# Patient Record
Sex: Male | Born: 1960
Health system: Southern US, Community
[De-identification: ages and names within clinical notes are randomized; demographics above are authoritative.]

## PROBLEM LIST (undated history)

## (undated) DIAGNOSIS — C3490 Malignant neoplasm of unspecified part of unspecified bronchus or lung: Secondary | ICD-10-CM

## (undated) DIAGNOSIS — M009 Pyogenic arthritis, unspecified: Secondary | ICD-10-CM

## (undated) HISTORY — DX: Pyogenic arthritis, unspecified: M00.9

## (undated) HISTORY — PX: OTHER SURGICAL HISTORY: SHX169

---

## 2007-09-08 ENCOUNTER — Emergency Department (HOSPITAL_COMMUNITY): Admission: EM | Admit: 2007-09-08 | Discharge: 2007-09-08 | Payer: Self-pay | Admitting: Emergency Medicine

## 2010-11-16 LAB — URINALYSIS, ROUTINE W REFLEX MICROSCOPIC
Glucose, UA: NEGATIVE
Hgb urine dipstick: NEGATIVE
Ketones, ur: NEGATIVE
Protein, ur: NEGATIVE
pH: 7

## 2010-11-16 LAB — CBC
HCT: 45.3
Hemoglobin: 15.1
MCV: 88.1
RBC: 5.13
WBC: 4.5

## 2010-11-16 LAB — DIFFERENTIAL
Eosinophils Absolute: 0
Eosinophils Relative: 0
Lymphocytes Relative: 38
Lymphs Abs: 1.7
Monocytes Relative: 7

## 2010-11-16 LAB — BASIC METABOLIC PANEL
BUN: 8
Chloride: 104
GFR calc Af Amer: >60
GFR calc non Af Amer: >60
Potassium: 4.3
Sodium: 139

## 2010-11-16 LAB — RAPID URINE DRUG SCREEN, HOSP PERFORMED
Amphetamines: NOT DETECTED
Barbiturates: NOT DETECTED
Cocaine: POSITIVE — AB
Opiates: NOT DETECTED
Tetrahydrocannabinol: NOT DETECTED

## 2010-11-16 LAB — HEMOGLOBIN A1C: Mean Plasma Glucose: 143

## 2010-11-16 LAB — URINE MICROSCOPIC-ADD ON

## 2010-11-16 LAB — ETHANOL: Alcohol, Ethyl (B): <5

## 2011-08-30 ENCOUNTER — Encounter (HOSPITAL_BASED_OUTPATIENT_CLINIC_OR_DEPARTMENT_OTHER): Payer: Self-pay

## 2011-08-30 ENCOUNTER — Emergency Department (HOSPITAL_BASED_OUTPATIENT_CLINIC_OR_DEPARTMENT_OTHER)
Admission: EM | Admit: 2011-08-30 | Discharge: 2011-08-30 | Disposition: A | Discharge status: Home / Self Care | Payer: Self-pay | Attending: Emergency Medicine | Admitting: Emergency Medicine

## 2011-08-30 DIAGNOSIS — IMO0002 Reserved for concepts with insufficient information to code with codable children: Secondary | ICD-10-CM | POA: Insufficient documentation

## 2011-08-30 DIAGNOSIS — F17200 Nicotine dependence, unspecified, uncomplicated: Secondary | ICD-10-CM | POA: Insufficient documentation

## 2011-08-30 DIAGNOSIS — L0291 Cutaneous abscess, unspecified: Secondary | ICD-10-CM

## 2011-08-30 MED ORDER — LIDOCAINE HCL 2 % IJ SOLN
INTRAMUSCULAR | Status: AC
  Filled 2011-08-30: qty 1

## 2011-08-30 MED ORDER — SULFAMETHOXAZOLE-TRIMETHOPRIM 800-160 MG PO TABS: 2.0000 | ORAL_TABLET | Freq: Two times a day (BID) | ORAL | Status: AC

## 2011-08-30 MED ORDER — IBUPROFEN 600 MG PO TABS: 600.0000 mg | ORAL_TABLET | Freq: Four times a day (QID) | ORAL | Status: AC | PRN

## 2011-08-30 MED ORDER — LIDOCAINE HCL 2 % IJ SOLN: 20.0000 mL | Freq: Once | INTRAMUSCULAR | Status: DC

## 2011-08-30 MED ORDER — OXYCODONE-ACETAMINOPHEN 5-325 MG PO TABS: 2.0000 | ORAL_TABLET | ORAL | Status: AC | PRN

## 2011-08-30 NOTE — ED Notes (Signed)
C/o "boil" to left arm x 7 days-draining

## 2011-08-30 NOTE — ED Provider Notes (Signed)
History     CSN: [id number]  Arrival date & time 08/30/11  1857   First MD Initiated Contact with Patient 08/30/11 2013      Chief Complaint  Patient presents with  . Abscess    (Consider location/radiation/quality/duration/timing/severity/associated sxs/prior treatment) HPI  50yoM previously healthy pw LUE pain. Pt states that he noted small bump to area which has become progressively bigger, painful, and now draining. Denies fever/chills. Denies numbness/tingling/weakness extr. H/O abscess scalp in past.   ED Notes, ED Provider Notes from 08/30/11 0000 to 08/30/11 19:26:48       Angeline Slim, RN 08/30/2011 19:26      C/o "boil" to left arm x 7 days-draining    History reviewed. No pertinent past medical history.  Past Surgical History  Procedure Date  . Knee surgery     No family history on file.  History  Substance Use Topics  . Smoking status: Current Everyday Smoker  . Smokeless tobacco: Not on file  . Alcohol Use: Yes      Review of Systems  All other systems reviewed and are negative.   except as noted HPI   Allergies  Review of patient's allergies indicates no known allergies.  Home Medications   Current Outpatient Rx  Name Route Sig Dispense Refill  . IBUPROFEN 600 MG PO TABS Oral Take 1 tablet (600 mg total) by mouth every 6 (six) hours as needed for pain. 30 tablet 0  . OXYCODONE-ACETAMINOPHEN 5-325 MG PO TABS Oral Take 2 tablets by mouth every 4 (four) hours as needed for pain. 6 tablet 0  . SULFAMETHOXAZOLE-TRIMETHOPRIM 800-160 MG PO TABS Oral Take 2 tablets by mouth every 12 (twelve) hours. 40 tablet 0    BP 135/88  Pulse 86  Temp 98.7 F (37.1 C) (Oral)  Resp 18  Ht 6' (1.829 m)  Wt 165 lb (74.844 kg)  BMI 22.38 kg/m2  SpO2 98%  Physical Exam  Nursing note and vitals reviewed. Constitutional: He is oriented to person, place, and time. He appears well-developed and well-nourished. No distress.  HENT:  Head: Atraumatic.    Mouth/Throat: Oropharynx is clear and moist.  Eyes: Conjunctivae are normal. Pupils are equal, round, and reactive to light.  Neck: Neck supple.  Cardiovascular: Normal rate, regular rhythm, normal heart sounds and intact distal pulses.  Exam reveals no gallop and no friction rub.   No murmur heard. Pulmonary/Chest: Effort normal. No respiratory distress. He has no wheezes. He has no rales.  Abdominal: Soft. Bowel sounds are normal. There is no tenderness. There is no rebound and no guarding.  Musculoskeletal: Normal range of motion. He exhibits no edema and no tenderness.  Neurological: He is alert and oriented to person, place, and time.  Skin: Skin is warm and dry.     Psychiatric: He has a normal mood and affect.    ED Course  Procedures (including critical care time)  Labs Reviewed - No data to display No results found.   1. Abscess     MDM  No incision required. Loculations broken and packing placed. F/U recheck in 48 hours. Bactrim. Precautions for return.        Forbes Cellar, MD 08/30/11 2109

## 2011-09-03 ENCOUNTER — Emergency Department (HOSPITAL_BASED_OUTPATIENT_CLINIC_OR_DEPARTMENT_OTHER)
Admission: EM | Admit: 2011-09-03 | Discharge: 2011-09-03 | Disposition: A | Discharge status: Home / Self Care | Payer: Self-pay | Attending: Emergency Medicine | Admitting: Emergency Medicine

## 2011-09-03 ENCOUNTER — Encounter (HOSPITAL_BASED_OUTPATIENT_CLINIC_OR_DEPARTMENT_OTHER): Payer: Self-pay | Admitting: Family Medicine

## 2011-09-03 DIAGNOSIS — F17200 Nicotine dependence, unspecified, uncomplicated: Secondary | ICD-10-CM | POA: Insufficient documentation

## 2011-09-03 DIAGNOSIS — Z4801 Encounter for change or removal of surgical wound dressing: Secondary | ICD-10-CM | POA: Insufficient documentation

## 2011-09-03 DIAGNOSIS — Z4800 Encounter for change or removal of nonsurgical wound dressing: Secondary | ICD-10-CM

## 2011-09-03 NOTE — ED Notes (Signed)
Pt sts he is here for f/u of abscess to left forearm. Pt sts he was here and wound was drained and packed on Saturday. Drainage noted to gauze. Pt denies fever and sts she is taking abx and denies pain.

## 2011-09-03 NOTE — ED Provider Notes (Addendum)
History     CSN: [id number]  Arrival date & time 09/03/11  1003   First MD Initiated Contact with Patient 09/03/11 1036      Chief Complaint  Patient presents with  . Wound Check    (Consider location/radiation/quality/duration/timing/severity/associated sxs/prior treatment) HPI 51 year old male comes in for a wound check. He had an abscess on his left forearm treated 4 days ago. He states with one dressing change, some of the packing came out so he cut off the part that had pulled out. The remainder the packing came out when he took off the dressing today. He states that it is not painful and he denies fever, chills, sweats. History reviewed. No pertinent past medical history.  Past Surgical History  Procedure Date  . Knee surgery     No family history on file.  History  Substance Use Topics  . Smoking status: Current Everyday Smoker  . Smokeless tobacco: Not on file  . Alcohol Use: Yes      Review of Systems  Allergies  Review of patient's allergies indicates no known allergies.  Home Medications   Current Outpatient Rx  Name Route Sig Dispense Refill  . IBUPROFEN 600 MG PO TABS Oral Take 1 tablet (600 mg total) by mouth every 6 (six) hours as needed for pain. 30 tablet 0  . OXYCODONE-ACETAMINOPHEN 5-325 MG PO TABS Oral Take 2 tablets by mouth every 4 (four) hours as needed for pain. 6 tablet 0  . SULFAMETHOXAZOLE-TRIMETHOPRIM 800-160 MG PO TABS Oral Take 2 tablets by mouth every 12 (twelve) hours. 40 tablet 0    BP 133/78  Pulse 77  Temp 97.9 F (36.6 C) (Oral)  Resp 18  Ht 6' (1.829 m)  Wt 165 lb (74.844 kg)  BMI 22.38 kg/m2  SpO2 96%  Physical Exam 51 year old male who is resting comfortably in no acute distress. Vital signs are normal. Oxygen saturation is 96% which is normal. Exam of his left forearm shows no wound approximately 1 cm in diameter. The surrounding skin is raised but it is not indurated and not erythematous. There is very slight  purulent drainage from the wound. At this point, I believe that it needs to heal by second intention, but has not needed additional packing. ED Course  Procedures (including critical care time)  Labs Reviewed - No data to display No results found.   1. Abscess packing removal       MDM  Abscess of the left forearm which is healing appropriately. He is advised to keep it covered until the wound closes. He is advised to stop smoking. Prior ED record was reviewed and packing was placed 4 days ago.        Dione Booze, MD 09/03/11 1046  Dione Booze, MD 09/03/11 1046

## 2013-05-10 ENCOUNTER — Emergency Department (HOSPITAL_BASED_OUTPATIENT_CLINIC_OR_DEPARTMENT_OTHER)
Admission: EM | Admit: 2013-05-10 | Discharge: 2013-05-10 | Disposition: A | Discharge status: Home / Self Care | Payer: Self-pay | Attending: Emergency Medicine | Admitting: Emergency Medicine

## 2013-05-10 ENCOUNTER — Encounter (HOSPITAL_BASED_OUTPATIENT_CLINIC_OR_DEPARTMENT_OTHER): Payer: Self-pay | Admitting: Emergency Medicine

## 2013-05-10 DIAGNOSIS — R1013 Epigastric pain: Secondary | ICD-10-CM | POA: Insufficient documentation

## 2013-05-10 DIAGNOSIS — R109 Unspecified abdominal pain: Secondary | ICD-10-CM

## 2013-05-10 DIAGNOSIS — R110 Nausea: Secondary | ICD-10-CM | POA: Insufficient documentation

## 2013-05-10 DIAGNOSIS — R197 Diarrhea, unspecified: Secondary | ICD-10-CM | POA: Insufficient documentation

## 2013-05-10 DIAGNOSIS — F17200 Nicotine dependence, unspecified, uncomplicated: Secondary | ICD-10-CM | POA: Insufficient documentation

## 2013-05-10 LAB — URINALYSIS, ROUTINE W REFLEX MICROSCOPIC
BILIRUBIN URINE: NEGATIVE
Glucose, UA: NEGATIVE mg/dL
Ketones, ur: NEGATIVE mg/dL
Leukocytes, UA: NEGATIVE
Nitrite: NEGATIVE
PH: 6.5 (ref 5.0–8.0)
Protein, ur: NEGATIVE mg/dL
SPECIFIC GRAVITY, URINE: 1.021 (ref 1.005–1.030)
Urobilinogen, UA: 1 mg/dL (ref 0.0–1.0)

## 2013-05-10 LAB — COMPREHENSIVE METABOLIC PANEL
ALBUMIN: 3.7 g/dL (ref 3.5–5.2)
ALK PHOS: 80 U/L (ref 39–117)
ALT: 21 U/L (ref 0–53)
AST: 26 U/L (ref 0–37)
BUN: 13 mg/dL (ref 6–23)
CO2: 25 mEq/L (ref 19–32)
Calcium: 9.6 mg/dL (ref 8.4–10.5)
Chloride: 103 mEq/L (ref 96–112)
Creatinine, Ser: 1.2 mg/dL (ref 0.50–1.35)
GFR calc Af Amer: 79 mL/min — ABNORMAL LOW (ref >90)
GFR calc non Af Amer: 68 mL/min — ABNORMAL LOW (ref >90)
Glucose, Bld: 156 mg/dL — ABNORMAL HIGH (ref 70–99)
POTASSIUM: 4.2 meq/L (ref 3.7–5.3)
SODIUM: 142 meq/L (ref 137–147)
TOTAL PROTEIN: 7.3 g/dL (ref 6.0–8.3)
Total Bilirubin: 0.4 mg/dL (ref 0.3–1.2)

## 2013-05-10 LAB — CBC WITH DIFFERENTIAL/PLATELET
BASOS ABS: 0 10*3/uL (ref 0.0–0.1)
BASOS PCT: 0 % (ref 0–1)
EOS ABS: 0.1 10*3/uL (ref 0.0–0.7)
Eosinophils Relative: 3 % (ref 0–5)
HCT: 43.8 % (ref 39.0–52.0)
Hemoglobin: 14.8 g/dL (ref 13.0–17.0)
LYMPHS ABS: 2.6 10*3/uL (ref 0.7–4.0)
Lymphocytes Relative: 64 % — ABNORMAL HIGH (ref 12–46)
MCH: 30.7 pg (ref 26.0–34.0)
MCHC: 33.8 g/dL (ref 30.0–36.0)
MCV: 90.9 fL (ref 78.0–100.0)
Monocytes Absolute: 0.3 10*3/uL (ref 0.1–1.0)
Monocytes Relative: 8 % (ref 3–12)
NEUTROS PCT: 25 % — AB (ref 43–77)
Neutro Abs: 1 10*3/uL — ABNORMAL LOW (ref 1.7–7.7)
PLATELETS: 246 10*3/uL (ref 150–400)
RBC: 4.82 MIL/uL (ref 4.22–5.81)
RDW: 14.1 % (ref 11.5–15.5)
WBC: 4.1 10*3/uL (ref 4.0–10.5)

## 2013-05-10 LAB — URINE MICROSCOPIC-ADD ON

## 2013-05-10 LAB — LIPASE, BLOOD: Lipase: 35 U/L (ref 11–59)

## 2013-05-10 MED ORDER — METOCLOPRAMIDE HCL 10 MG PO TABS: 10.0000 mg | ORAL_TABLET | Freq: Four times a day (QID) | ORAL | Status: DC | PRN

## 2013-05-10 MED ORDER — ONDANSETRON 4 MG PO TBDP
4.0000 mg | ORAL_TABLET | Freq: Once | ORAL | Status: AC
  Administered 2013-05-10: 4 mg via ORAL
  Filled 2013-05-10: qty 1

## 2013-05-10 NOTE — Discharge Instructions (Signed)
As discussed, your evaluation today has been largely reassuring.  But, it is important that you monitor your condition carefully, and do not hesitate to return to the ED if you develop new, or concerning changes in your condition. ° °Otherwise, please follow-up with your physician for appropriate ongoing care. ° ° ° °Abdominal Pain, Adult °Many things can cause abdominal pain. Usually, abdominal pain is not caused by a disease and will improve without treatment. It can often be observed and treated at home. Your health care provider will do a physical exam and possibly order blood tests and X-rays to help determine the seriousness of your pain. However, in many cases, more time must pass before a clear cause of the pain can be found. Before that point, your health care provider may not know if you need more testing or further treatment. °HOME CARE INSTRUCTIONS  °Monitor your abdominal pain for any changes. The following actions may help to alleviate any discomfort you are experiencing: °· Only take over-the-counter or prescription medicines as directed by your health care provider. °· Do not take laxatives unless directed to do so by your health care provider. °· Try a clear liquid diet (broth, tea, or water) as directed by your health care provider. Slowly move to a bland diet as tolerated. °SEEK MEDICAL CARE IF: °· You have unexplained abdominal pain. °· You have abdominal pain associated with nausea or diarrhea. °· You have pain when you urinate or have a bowel movement. °· You experience abdominal pain that wakes you in the night. °· You have abdominal pain that is worsened or improved by eating food. °· You have abdominal pain that is worsened with eating fatty foods. °SEEK IMMEDIATE MEDICAL CARE IF:  °· Your pain does not go away within 2 hours. °· You have a fever. °· You keep throwing up (vomiting). °· Your pain is felt only in portions of the abdomen, such as the right side or the left lower portion of the  abdomen. °· You pass bloody or black tarry stools. °MAKE SURE YOU: °· Understand these instructions.   °· Will watch your condition.   °· Will get help right away if you are not doing well or get worse.   °Document Released: 11/14/2004 Document Revised: 11/25/2012 Document Reviewed: 10/14/2012 °Exit[employer]® Patient Information ©2014 [employer]. ° °

## 2013-05-10 NOTE — ED Provider Notes (Signed)
CSN: [id number]     Arrival date & time 05/10/13  1624 History   This chart was scribed for Carmin Muskrat, MD by Era Bumpers, ED scribe. This patient was seen in room MH08/MH08 and the patient's care was started at 1624.  Chief Complaint  Patient presents with  . Abdominal Pain   The history is provided by the patient. No language interpreter was used.   HPI Comments: Roney Youtz is a 53 y.o. male who presents to the Emergency Department complaining of mid, lower abdominal pain with associated diarrhea onset today. He reports was fine yesterday. He didn't go to work today b/c of his abdominal pain and diarrhea. Has had about x3 episodes of diarrhea today. He reports associated nausea; denies emesis, fever, syncope, skin rash or any blood in his stool. He has not tried taking any medicines for this problem. He was at [facility] today for same but the wait was too long. He thinks maybe what he ate is causing this, last meal was rice and [place]; denies any new or unusual foods. He states no medical hx. Uses cocaine, last was yesterday.  He is a smoker.   History reviewed. No pertinent past medical history. Past Surgical History  Procedure Laterality Date  . Knee surgery     No family history on file. History  Substance Use Topics  . Smoking status: Current Every Day Smoker -- 1.50 packs/day    Types: Cigarettes  . Smokeless tobacco: Not on file  . Alcohol Use: No    Review of Systems  Constitutional: Negative for fever and chills.  Respiratory: Negative for cough and shortness of breath.   Cardiovascular: Negative for chest pain.  Gastrointestinal: Positive for nausea, abdominal pain and diarrhea. Negative for vomiting.  Musculoskeletal: Negative for back pain.  Skin: Negative for rash.  All other systems reviewed and are negative.    Allergies  Review of patient's allergies indicates no known allergies.  Home Medications  No current outpatient prescriptions on  file.  Triage Vitals: BP 130/80  Pulse 80  Temp(Src) 98.1 F (36.7 C) (Oral)  Resp 18  Ht 6' (1.829 m)  Wt 155 lb (70.308 kg)  BMI 21.02 kg/m2  SpO2 99%  Physical Exam  Nursing note and vitals reviewed. Constitutional: He is oriented to person, place, and time. He appears well-developed and well-nourished. No distress.  HENT:  Head: Normocephalic and atraumatic.  Eyes: Conjunctivae are normal. Right eye exhibits no discharge. Left eye exhibits no discharge.  Neck: Normal range of motion.  Cardiovascular: Normal rate, regular rhythm and normal heart sounds.   No murmur heard. Pulmonary/Chest: Effort normal and breath sounds normal. No respiratory distress. He has no wheezes. He has no rales.  Abdominal: Soft. He exhibits no distension. There is tenderness. There is no rebound and no guarding.  Mild epigastric tenderness to palpation  Musculoskeletal: Normal range of motion. He exhibits no edema.  Neurological: He is alert and oriented to person, place, and time.  Skin: Skin is warm and dry.  Psychiatric: He has a normal mood and affect. Thought content normal.    ED Course  Procedures (including critical care time) DIAGNOSTIC STUDIES: Oxygen Saturation is 99% on room air, normal by my interpretation.    COORDINATION OF CARE: At 435 PM Discussed treatment plan with patient which includes Zofran, blood work, UA. Patient agrees.   Labs Review Labs Reviewed  CBC WITH DIFFERENTIAL - Abnormal; Notable for the following:    Neutrophils Relative %  25 (*)    Neutro Abs 1.0 (*)    Lymphocytes Relative 64 (*)    All other components within normal limits  COMPREHENSIVE METABOLIC PANEL - Abnormal; Notable for the following:    Glucose, Bld 156 (*)    GFR calc non Af Amer 68 (*)    GFR calc Af Amer 79 (*)    All other components within normal limits  URINALYSIS, ROUTINE W REFLEX MICROSCOPIC - Abnormal; Notable for the following:    Hgb urine dipstick TRACE (*)    All other  components within normal limits  URINE MICROSCOPIC-ADD ON - Abnormal; Notable for the following:    Bacteria, UA FEW (*)    All other components within normal limits  LIPASE, BLOOD   5:47 PM Patient lying in bed, no active complaints.  We discussed all findings, return precautions / f-u instructions  MDM   Final diagnoses:  Abdominal pain    Patient presents with concern of abdominal pain, 3 episodes of loose stool today.  On exam he is awake, alert and afebrile, hemodynamically stable, with soft, non-peritoneal abdomen.  Patient's labs are largely reassuring.  She was discharged in stable condition to follow up with gastroenterology as needed.   I personally performed the services described in this documentation, which was scribed in my presence. The recorded information has been reviewed and is accurate.      Carmin Muskrat, MD 05/10/13 [phone]

## 2013-05-10 NOTE — ED Notes (Signed)
Pt reports today with diarrhea, nausea and diffuse abd pain.

## 2013-05-10 NOTE — ED Notes (Signed)
MD at bedside. 

## 2018-11-10 ENCOUNTER — Emergency Department (HOSPITAL_BASED_OUTPATIENT_CLINIC_OR_DEPARTMENT_OTHER): Payer: Medicaid Other

## 2018-11-10 ENCOUNTER — Encounter (HOSPITAL_BASED_OUTPATIENT_CLINIC_OR_DEPARTMENT_OTHER): Payer: Self-pay | Admitting: *Deleted

## 2018-11-10 ENCOUNTER — Inpatient Hospital Stay (HOSPITAL_BASED_OUTPATIENT_CLINIC_OR_DEPARTMENT_OTHER)
Admission: EM | Admit: 2018-11-10 | Discharge: 2018-11-13 | DRG: 153 | Disposition: A | Discharge status: Home Health | Payer: Medicaid Other | Attending: Internal Medicine | Admitting: Internal Medicine

## 2018-11-10 ENCOUNTER — Other Ambulatory Visit: Payer: Self-pay

## 2018-11-10 DIAGNOSIS — Z20828 Contact with and (suspected) exposure to other viral communicable diseases: Secondary | ICD-10-CM | POA: Diagnosis present

## 2018-11-10 DIAGNOSIS — F1290 Cannabis use, unspecified, uncomplicated: Secondary | ICD-10-CM | POA: Diagnosis present

## 2018-11-10 DIAGNOSIS — D1803 Hemangioma of intra-abdominal structures: Secondary | ICD-10-CM | POA: Diagnosis present

## 2018-11-10 DIAGNOSIS — J029 Acute pharyngitis, unspecified: Secondary | ICD-10-CM | POA: Diagnosis present

## 2018-11-10 DIAGNOSIS — Z833 Family history of diabetes mellitus: Secondary | ICD-10-CM | POA: Diagnosis not present

## 2018-11-10 DIAGNOSIS — F1490 Cocaine use, unspecified, uncomplicated: Secondary | ICD-10-CM | POA: Diagnosis present

## 2018-11-10 DIAGNOSIS — N281 Cyst of kidney, acquired: Secondary | ICD-10-CM | POA: Diagnosis present

## 2018-11-10 DIAGNOSIS — Z808 Family history of malignant neoplasm of other organs or systems: Secondary | ICD-10-CM | POA: Diagnosis not present

## 2018-11-10 DIAGNOSIS — Z79899 Other long term (current) drug therapy: Secondary | ICD-10-CM

## 2018-11-10 DIAGNOSIS — R918 Other nonspecific abnormal finding of lung field: Secondary | ICD-10-CM | POA: Diagnosis present

## 2018-11-10 DIAGNOSIS — K769 Liver disease, unspecified: Secondary | ICD-10-CM | POA: Diagnosis present

## 2018-11-10 DIAGNOSIS — Z8249 Family history of ischemic heart disease and other diseases of the circulatory system: Secondary | ICD-10-CM | POA: Diagnosis not present

## 2018-11-10 DIAGNOSIS — F17210 Nicotine dependence, cigarettes, uncomplicated: Secondary | ICD-10-CM | POA: Diagnosis present

## 2018-11-10 DIAGNOSIS — R160 Hepatomegaly, not elsewhere classified: Secondary | ICD-10-CM

## 2018-11-10 DIAGNOSIS — Z801 Family history of malignant neoplasm of trachea, bronchus and lung: Secondary | ICD-10-CM | POA: Diagnosis not present

## 2018-11-10 DIAGNOSIS — J36 Peritonsillar abscess: Principal | ICD-10-CM | POA: Diagnosis present

## 2018-11-10 LAB — BASIC METABOLIC PANEL
Anion gap: 11 (ref 5–15)
BUN: 10 mg/dL (ref 6–20)
CO2: 25 mmol/L (ref 22–32)
Calcium: 8.9 mg/dL (ref 8.9–10.3)
Chloride: 103 mmol/L (ref 98–111)
Creatinine, Ser: 0.94 mg/dL (ref 0.61–1.24)
GFR calc Af Amer: >60 mL/min (ref >60)
GFR calc non Af Amer: >60 mL/min (ref >60)
Glucose, Bld: 96 mg/dL (ref 70–99)
Potassium: 4.3 mmol/L (ref 3.5–5.1)
Sodium: 139 mmol/L (ref 135–145)

## 2018-11-10 LAB — GROUP A STREP BY PCR: Group A Strep by PCR: NOT DETECTED

## 2018-11-10 LAB — CBC WITH DIFFERENTIAL/PLATELET
Abs Immature Granulocytes: 0.07 10*3/uL (ref 0.00–0.07)
Basophils Absolute: 0 10*3/uL (ref 0.0–0.1)
Basophils Relative: 0 %
Eosinophils Absolute: 0.1 10*3/uL (ref 0.0–0.5)
Eosinophils Relative: 1 %
HCT: 43.7 % (ref 39.0–52.0)
Hemoglobin: 14.1 g/dL (ref 13.0–17.0)
Immature Granulocytes: 1 %
Lymphocytes Relative: 21 %
Lymphs Abs: 2 10*3/uL (ref 0.7–4.0)
MCH: 30 pg (ref 26.0–34.0)
MCHC: 32.3 g/dL (ref 30.0–36.0)
MCV: 93 fL (ref 80.0–100.0)
Monocytes Absolute: 0.9 10*3/uL (ref 0.1–1.0)
Monocytes Relative: 10 %
Neutro Abs: 6.4 10*3/uL (ref 1.7–7.7)
Neutrophils Relative %: 67 %
Platelets: 359 10*3/uL (ref 150–400)
RBC: 4.7 MIL/uL (ref 4.22–5.81)
RDW: 13.6 % (ref 11.5–15.5)
WBC: 9.5 10*3/uL (ref 4.0–10.5)
nRBC: 0 % (ref 0.0–0.2)

## 2018-11-10 LAB — SARS CORONAVIRUS 2 BY RT PCR (HOSPITAL ORDER, PERFORMED IN ~~LOC~~ HOSPITAL LAB): SARS Coronavirus 2: NEGATIVE

## 2018-11-10 MED ORDER — DEXAMETHASONE SODIUM PHOSPHATE 10 MG/ML IJ SOLN
10.0000 mg | Freq: Three times a day (TID) | INTRAMUSCULAR | Status: AC
  Administered 2018-11-10 – 2018-11-11 (×2): 10 mg via INTRAVENOUS
  Filled 2018-11-10 (×2): qty 1

## 2018-11-10 MED ORDER — ACETAMINOPHEN 325 MG PO TABS
650.0000 mg | ORAL_TABLET | Freq: Four times a day (QID) | ORAL | Status: DC | PRN
  Administered 2018-11-12: 650 mg via ORAL
  Filled 2018-11-10: qty 2

## 2018-11-10 MED ORDER — CLINDAMYCIN PHOSPHATE 600 MG/50ML IV SOLN
600.0000 mg | Freq: Once | INTRAVENOUS | Status: AC
  Administered 2018-11-10: 600 mg via INTRAVENOUS
  Filled 2018-11-10: qty 50

## 2018-11-10 MED ORDER — LIDOCAINE-EPINEPHRINE 1 %-1:100000 IJ SOLN
10.0000 mL | Freq: Once | INTRAMUSCULAR | Status: AC
  Administered 2018-11-10: 10 mL
  Filled 2018-11-10: qty 10

## 2018-11-10 MED ORDER — SODIUM CHLORIDE 0.9 % IV SOLN
INTRAVENOUS | Status: AC
  Administered 2018-11-10: 19:00:00 via INTRAVENOUS

## 2018-11-10 MED ORDER — IOHEXOL 300 MG/ML  SOLN
100.0000 mL | Freq: Once | INTRAMUSCULAR | Status: AC | PRN
  Administered 2018-11-10: 75 mL via INTRAVENOUS

## 2018-11-10 MED ORDER — ACETAMINOPHEN 650 MG RE SUPP: 650.0000 mg | Freq: Four times a day (QID) | RECTAL | Status: DC | PRN

## 2018-11-10 MED ORDER — CHLORHEXIDINE GLUCONATE 0.12 % MT SOLN
15.0000 mL | Freq: Three times a day (TID) | OROMUCOSAL | Status: DC
  Administered 2018-11-11 – 2018-11-13 (×6): 15 mL via OROMUCOSAL
  Filled 2018-11-10 (×5): qty 15

## 2018-11-10 MED ORDER — ENOXAPARIN SODIUM 40 MG/0.4ML ~~LOC~~ SOLN
40.0000 mg | SUBCUTANEOUS | Status: DC
  Administered 2018-11-11: 40 mg via SUBCUTANEOUS
  Filled 2018-11-10 (×2): qty 0.4

## 2018-11-10 MED ORDER — SODIUM CHLORIDE 0.9 % IV SOLN
INTRAVENOUS | Status: DC | PRN
  Administered 2018-11-10: 1000 mL via INTRAVENOUS

## 2018-11-10 MED ORDER — CLINDAMYCIN PHOSPHATE 600 MG/50ML IV SOLN
600.0000 mg | Freq: Three times a day (TID) | INTRAVENOUS | Status: DC
  Administered 2018-11-10 – 2018-11-13 (×8): 600 mg via INTRAVENOUS
  Filled 2018-11-10 (×8): qty 50

## 2018-11-10 MED ORDER — MORPHINE SULFATE (PF) 4 MG/ML IV SOLN
4.0000 mg | Freq: Once | INTRAVENOUS | Status: AC
  Administered 2018-11-10: 4 mg via INTRAVENOUS
  Filled 2018-11-10: qty 1

## 2018-11-10 MED ORDER — OXYCODONE-ACETAMINOPHEN 5-325 MG PO TABS
1.0000 | ORAL_TABLET | Freq: Four times a day (QID) | ORAL | Status: DC | PRN
  Administered 2018-11-11 (×2): 1 via ORAL
  Administered 2018-11-12 – 2018-11-13 (×5): 2 via ORAL
  Filled 2018-11-10 (×2): qty 2
  Filled 2018-11-10: qty 1
  Filled 2018-11-10 (×3): qty 2
  Filled 2018-11-10: qty 1

## 2018-11-10 MED ORDER — DEXAMETHASONE SODIUM PHOSPHATE 10 MG/ML IJ SOLN
10.0000 mg | Freq: Once | INTRAMUSCULAR | Status: AC
  Administered 2018-11-10: 10 mg via INTRAVENOUS
  Filled 2018-11-10: qty 1

## 2018-11-10 NOTE — ED Notes (Signed)
Pt to ED via [place] from [facility] with Dx of Peritonsillar abscess.  Pt alert and oriented x's 4 speaking in full sentences.

## 2018-11-10 NOTE — Consult Note (Signed)
OTOLARYNGOLOGY CONSULTATION   Primary Care Physician: Patient, No Pcp Per Patient Location at Initial Consult: Emergency Department Chief Complaint/Reason for Consult: left PTA  History of Presenting Illness:    Micheal Vincent is a  58 y.o. male presenting with left sided throat pain.  This began about 1 week ago.  He has had increasing throat pain and referred otalgia to the left side.  He is able to tolerate his secretions.  He has no trismus.  No fevers.  Normal white blood cell count.  He has had tonsillitis before but no prior peritonsillar abscesses.  He is a transfer from [facility] where he had a CT scan which demonstrated a left peritonsillar abscess in addition to a left upper lung mass.  He is a current daily smoker.  He has no other ongoing medical problems.  He does not take any blood thinners.  He last ate dinner yesterday evening.  He has had some significant difficulty swallowing today.  History reviewed. No pertinent past medical history.  Past Surgical History:  Procedure Laterality Date  . knee surgery      No family history on file.  Social History   Socioeconomic History  . Marital status: Divorced    Spouse name: Not on file  . Number of children: Not on file  . Years of education: Not on file  . Highest education level: Not on file  Occupational History  . Not on file  Social Needs  . Financial resource strain: Not on file  . Food insecurity    Worry: Not on file    Inability: Not on file  . Transportation needs    Medical: Not on file    Non-medical: Not on file  Tobacco Use  . Smoking status: Current Every Day Smoker    Packs/day: 1.50    Types: Cigarettes  . Smokeless tobacco: Never Used  Substance and Sexual Activity  . Alcohol use: Yes  . Drug use: Yes    Types: Cocaine  . Sexual activity: Not on file  Lifestyle  . Physical activity    Days per week: Not on file    Minutes per session: Not on file  . Stress: Not on file  Relationships   . Social Herbalist on phone: Not on file    Gets together: Not on file    Attends religious service: Not on file    Active member of club or organization: Not on file    Attends meetings of clubs or organizations: Not on file    Relationship status: Not on file  Other Topics Concern  . Not on file  Social History Narrative  . Not on file    No current facility-administered medications on file prior to encounter.    No current outpatient medications on file prior to encounter.    No Known Allergies   Review of Systems: Review of systems is complete negative except for the above   OBJECTIVE: Vital Signs: Vitals:   11/10/18 1500 11/10/18 1724  BP: (!) 156/92 (!) 150/103  Pulse: 68 72  Resp:  14  Temp:  98.9 F (37.2 C)  SpO2: 99% 98%    I&O  Intake/Output Summary (Last 24 hours) at 11/10/2018 1811 Last data filed at 11/10/2018 1515 Gross per 24 hour  Intake 50 ml  Output -  Net 50 ml    Physical Exam General: Well developed, well nourished. No acute distress. Voice without dysphonia.   Head/Face: Normocephalic, atraumatic.  No scars or lesions. No sinus tenderness. Facial nerve intact and equal bilaterally.  No facial lacerations. Salivary glands non tender and without palpable masses  Eyes: Globes well positioned, no proptosis Lids: No periorbital edema/ecchymosis. No lid laceration Conjunctiva: No chemosis, hemorrhage PERRL Extra occular movement: Full ROM bilaterally. No gaze restriction    Ears: No gross deformity. Normal external canal. Tympanic membrane intact bilaterally and without effusion  Hearing:  Normal speech reception.  Nose: No gross deformity or lesions. No purulent discharge. Septum midline. No turbinate hypertrophy.  Mouth/Oropharynx: Lips without any lesions. Dentition is poor.  The uvula demonstrates uvular hydrops.  There is significant left-sided tonsillar swelling and mild peritonsillar fluctuance.  There is no trismus.  Neck:  Trachea midline. No masses. No thyromegaly or nodules palpated. No crepitus.  Lymphatic:  There is a single enlarged lymph node in the left level 2; no other lymphadenopathy in the neck.  Respiratory: No stridor or distress.  Cardiovascular: Regular rate and rhythm.  Extremities: No edema or cyanosis. Warm and well-perfused.  Skin: No scars or lesions on face or neck.  Neurologic: CN II-XII intact. Moving all extremities without gross abnormality.  Other:      Labs: Lab Results  Component Value Date   WBC 9.5 11/10/2018   HGB 14.1 11/10/2018   HCT 43.7 11/10/2018   PLT 359 11/10/2018   ALT 21 05/10/2013   AST 26 05/10/2013   NA 139 11/10/2018   K 4.3 11/10/2018   CL 103 11/10/2018   CREATININE 0.94 11/10/2018   BUN 10 11/10/2018   CO2 25 11/10/2018   HGBA1C (H) 09/08/2007    6.2 (NOTE)   The ADA recommends the following therapeutic goal for glycemic   control related to Hgb A1C measurement:   Goal of Therapy:   < 7.0% Hgb A1C   Reference: American Diabetes Association: Clinical Practice   Recommendations 2008, Diabetes Care,  2008, 31:(Suppl 1).     Review of Ancillary Data / Diagnostic Tests: CT neck personally reviewed and demonstrates a left peritonsillar phlegmon versus abscess.  Left level 2 lymphadenopathy in a single lymph node which was palpable on exam.  White blood cell count is normal.  Covid testing was negative.  Strep testing negative.  Procedure: Incision and drainage of left peritonsillar abscess Findings: There was some murky colored fluid which discharged from the left peritonsillar space Details: Informed consent was obtained.  The patient's left peritonsillar region/soft palate were anesthetized with Hurricaine spray.  Next 1% lidocaine with 1-100,000 epinephrine was injected submucosally.  Next a 15 blade was utilized to open up the peritonsillar region.  Next, a hemostat was utilized to open up all loculations and some dishwater colored drainage as well as a  little bit of purulence was evacuated from the peritonsillar space.  The region was then irrigated copiously with normal saline.  All instrumentation was removed.  The patient tolerated the procedure well and there were no complications.  ASSESSMENT:  58 y.o. male with left peritonsillar abscess status post incision and drainage in the setting of also having a newly diagnosed left upper lung mass.  Patient is being admitted to the hospitalist service.  RECOMMENDATIONS:  Continue clindamycin 3 times daily for 10 days  I would recommend a total of 3 doses of 10 mg Decadron 8 hours apart.  He received 1 of these doses at [facility].  Peridex mouthwash 3 times daily after meals  Soft diet for 5 days then gradually advance to regular diet.  Agree with hospitalist admission, I will check on him in the morning   Follow up with Dr. Blenda Nicely in 1 week for wound recheck    Gavin Pound, MD  [facility], [place] Office phone [phone]

## 2018-11-10 NOTE — ED Notes (Signed)
Tried calling ED [phone] and call forwarded to VM. Called ED and phone answered by Secretary DEE, asked to be transferred to charge-opn hold for 10 minutes. Care link arrived, pt transported.

## 2018-11-10 NOTE — Progress Notes (Deleted)
Void  

## 2018-11-10 NOTE — ED Provider Notes (Signed)
[facility] EMERGENCY DEPARTMENT Provider Note   CSN: [id number] Arrival date & time: 11/10/18  1150     History   Chief Complaint Chief Complaint  Patient presents with   Sore Throat    HPI Micheal Vincent is a 58 y.o. male.  He is complaining of 1 week of left ear left side of throat and jaw pain.  Denies any trauma.  He is not sure if he has had a fever.  Intermittent cough.  Pain with swallowing.     The history is provided by the patient.  Sore Throat This is a new problem. The current episode started more than 1 week ago. The problem occurs constantly. The problem has not changed since onset.Pertinent negatives include no chest pain, no abdominal pain, no headaches and no shortness of breath. The symptoms are aggravated by swallowing. Nothing relieves the symptoms. He has tried nothing for the symptoms. The treatment provided no relief.    History reviewed. No pertinent past medical history.  There are no active problems to display for this patient.   Past Surgical History:  Procedure Laterality Date   knee surgery          Home Medications    Prior to Admission medications   Medication Sig Start Date End Date Taking? Authorizing Provider  metoCLOPramide (REGLAN) 10 MG tablet Take 1 tablet (10 mg total) by mouth every 6 (six) hours as needed for nausea. 05/10/13   Carmin Muskrat, MD    Family History No family history on file.  Social History Social History   Tobacco Use   Smoking status: Current Every Day Smoker    Packs/day: 1.50    Types: Cigarettes   Smokeless tobacco: Never Used  Substance Use Topics   Alcohol use: Yes   Drug use: Yes    Types: Cocaine     Allergies   Patient has no known allergies.   Review of Systems Review of Systems  Constitutional: Negative for fever.  HENT: Positive for ear pain, sore throat and trouble swallowing.   Eyes: Negative for visual disturbance.  Respiratory: Negative for shortness of  breath.   Cardiovascular: Negative for chest pain.  Gastrointestinal: Negative for abdominal pain.  Genitourinary: Negative for dysuria.  Musculoskeletal: Positive for neck pain.  Skin: Negative for rash.  Neurological: Negative for headaches.     Physical Exam Updated Vital Signs BP (!) 180/100 (BP Location: Right Arm)    Pulse 80    Temp 98.5 F (36.9 C) (Oral)    Resp 14    SpO2 98%   Physical Exam Vitals signs and nursing note reviewed.  Constitutional:      Appearance: He is well-developed.  HENT:     Head: Normocephalic and atraumatic.     Right Ear: Tympanic membrane normal.     Left Ear: Tympanic membrane normal.     Mouth/Throat:     Mouth: Mucous membranes are moist.     Pharynx: Pharyngeal swelling, oropharyngeal exudate and posterior oropharyngeal erythema present.     Tonsils: Tonsillar abscess present.     Comments: Patient has a lot of fullness of the left tonsillar pillar Eyes:     Conjunctiva/sclera: Conjunctivae normal.  Neck:     Musculoskeletal: Neck supple.  Cardiovascular:     Rate and Rhythm: Normal rate and regular rhythm.     Heart sounds: No murmur.  Pulmonary:     Effort: Pulmonary effort is normal. No respiratory distress.     Breath  sounds: Normal breath sounds.  Abdominal:     Palpations: Abdomen is soft.     Tenderness: There is no abdominal tenderness.  Musculoskeletal: Normal range of motion.  Lymphadenopathy:     Cervical: Cervical adenopathy present.  Skin:    General: Skin is warm and dry.     Capillary Refill: Capillary refill takes less than 2 seconds.  Neurological:     General: No focal deficit present.     Mental Status: He is alert and oriented to person, place, and time.      ED Treatments / Results  Labs (all labs ordered are listed, but only abnormal results are displayed) Labs Reviewed  GROUP A STREP BY PCR  SARS CORONAVIRUS 2 (HOSPITAL ORDER, [place] [facility])  BASIC METABOLIC PANEL  CBC  WITH DIFFERENTIAL/PLATELET    EKG None  Radiology Ct Soft Tissue Neck W Contrast  Result Date: 11/10/2018 CLINICAL DATA:  Left neck pain and swelling. EXAM: CT NECK WITH CONTRAST TECHNIQUE: Multidetector CT imaging of the neck was performed using the standard protocol following the bolus administration of intravenous contrast. CONTRAST:  35mL OMNIPAQUE IOHEXOL 300 MG/ML  SOLN COMPARISON:  None. FINDINGS: Pharynx and larynx: Rim enhancing fluid collection in the left tonsil measures 26 x 11 x 27 mm compatible with peritonsillar abscess. This is causing mass-effect on the pharynx which is displaced to the right and narrowing the nasopharynx and posterior oral pharyngeal airway. Larynx normal. Salivary glands: No inflammation, mass, or stone. Thyroid: Negative Lymph nodes: Enlarged left level 2 lymph node measuring 15 mm in short axis dimension. This is most consistent with peritonsillar abscess. No other pathologic lymph nodes in the neck. Numerous subcentimeter lymph nodes bilaterally. Vascular: Normal vascular enhancement bilaterally. Limited intracranial: Negative Visualized orbits: Not imaged Mastoids and visualized paranasal sinuses: Mild mucosal edema left maxillary sinus. Remaining sinuses clear. Skeleton: Cervical spondylosis.  Numerous dental caries. Upper chest: Left upper lobe mass lesion measuring 4.7 x 3.6 cm with surrounding infiltrate in the lung. Possible central necrosis with fluid density centrally. Right lung apex clear. No pleural effusion. Other: None IMPRESSION: 1. Left peritonsillar abscess measuring 26 x 11 x 27 mm with mass-effect on the pharyngeal airway. 2. Enlarged left level 2 lymph node 15 mm most compatible with peritonsillar abscess 3. Left upper lobe mass lesion 4.7 x 3.6 cm. This is suspicious for carcinoma lung although pneumonia is possible. Recommend CT chest with contrast for further evaluation. 4. These results were called by telephone at the time of interpretation on  11/10/2018 at 2:29 pm to provider [facility] , who verbally acknowledged these results. Electronically Signed   By: Franchot Gallo M.D.   On: 11/10/2018 14:33    Procedures Procedures (including critical care time)  Medications Ordered in ED Medications  0.9 %  sodium chloride infusion (1,000 mLs Intravenous New Bag/Given 11/10/18 1418)  morphine 4 MG/ML injection 4 mg (4 mg Intravenous Given 11/10/18 1344)  clindamycin (CLEOCIN) IVPB 600 mg (0 mg Intravenous Stopped 11/10/18 1515)  dexamethasone (DECADRON) injection 10 mg (10 mg Intravenous Given 11/10/18 1344)  iohexol (OMNIPAQUE) 300 MG/ML solution 100 mL (75 mLs Intravenous Contrast Given 11/10/18 1352)     Initial Impression / Assessment and Plan / ED Course  I have reviewed the triage vital signs and the nursing notes.  Pertinent labs & imaging results that were available during my care of the patient were reviewed by me and considered in my medical decision making (see chart for details).  Clinical Course as of Nov 10 1619  Tue Nov 09, 9573  [id number] 58 year old male here with left-sided neck pain for 1 week.  He looks like he has a peritonsillar abscess on physical exam.  Appointment for labs and CT and strep test.  Ordered IV antibiotics pain medicine and steroids.   [MB]  0045 He is a fairly strong voice and no evidence of trismus.   [MB]  9977 Discussed with radiology.  They said he has a large peritonsillar abscess on the left 2.5 cm.  They also are seeing a lung mass by neck CT and are recommending a CT chest with contrast.  They do not feel he should get contrast right now and so either get a Noncon today or have him come back for contrast study.  Will discuss with ENT and see how they want to proceed with his neck mass.   [MB]  4142 Discussed with Dr. Glenetta Borg from ENT.  She is asking that we do a quick Covid test on the patient and send him over to the [facility] ED will she will evaluate for a drainage procedure.  She said if he  would need admission after that they can determine that then.   [MB]  3953 I reviewed all this with the patient including the lung mass seen on CT.  He is agreeable for transfer to [facility] ED for evaluation by ENT and possible admission afterwards.  I discussed with Dr. Freddi Che ED physician at [facility] who accepts the patient in transfer.   [MB]    Clinical Course User Index [MB] Hayden Rasmussen, MD        Final Clinical Impressions(s) / ED Diagnoses   Final diagnoses:  Peritonsillar abscess  Lung mass    ED Discharge Orders    None       Hayden Rasmussen, MD 11/10/18 1622

## 2018-11-10 NOTE — ED Triage Notes (Signed)
Sore throat and left ear ache x 4 days.

## 2018-11-10 NOTE — ED Provider Notes (Signed)
  Physical Exam  BP (!) 156/92   Pulse 68   Temp 98.5 F (36.9 C) (Oral)   Resp 18   SpO2 99%   Physical Exam  ED Course/Procedures   Clinical Course as of Nov 10 1715  Tue Nov 09, 6232  [id number] 58 year old male here with left-sided neck pain for 1 week.  He looks like he has a peritonsillar abscess on physical exam.  Appointment for labs and CT and strep test.  Ordered IV antibiotics pain medicine and steroids.   [MB]  8416 He is a fairly strong voice and no evidence of trismus.   [MB]  6063 Discussed with radiology.  They said he has a large peritonsillar abscess on the left 2.5 cm.  They also are seeing a lung mass by neck CT and are recommending a CT chest with contrast.  They do not feel he should get contrast right now and so either get a Noncon today or have him come back for contrast study.  Will discuss with ENT and see how they want to proceed with his neck mass.   [MB]  0160 Discussed with Dr. Glenetta Borg from ENT.  She is asking that we do a quick Covid test on the patient and send him over to the [facility] ED will she will evaluate for a drainage procedure.  She said if he would need admission after that they can determine that then.   [MB]  1093 I reviewed all this with the patient including the lung mass seen on CT.  He is agreeable for transfer to [facility] ED for evaluation by ENT and possible admission afterwards.  I discussed with Dr. Freddi Che ED physician at [facility] who accepts the patient in transfer.   [MB]    Clinical Course User Index [MB] Hayden Rasmussen, MD    Procedures  MDM  Patient transfer from [facility].  Will be drained in the ER by Dr. Blenda Nicely.  However will require antibiotics.  Also with lung mass and no primary care doctor feels the patient benefit from [place] the hospital for IV antibiotics and further work-up of the lung mass.      Davonna Belling, MD 11/10/18 [phone]

## 2018-11-10 NOTE — ED Notes (Signed)
ED TO INPATIENT HANDOFF REPORT  ED Nurse Name and Phone #: Orlando Penner [phone]  S Name/Age/Gender Micheal Vincent 58 y.o. male Room/Bed: MH10/MH10  Code Status   Code Status: Not on file  Home/SNF/Other [facility] ED   Is this baseline? Yes   Triage Complete: Triage complete  Chief Complaint sore throat, ear, jaw and chin pain  Triage Note Sore throat and left ear ache x 4 days.    Allergies No Known Allergies  Level of Care/Admitting Diagnosis ED Disposition    ED Disposition Condition Comment   Transfer to Another Facility  The patient appears reasonably stabilized for transfer considering the current resources, flow, and capabilities available in the ED at this time, and I doubt any other [facility] requiring further screening and/or treatment in the ED prior to transfer is p resent.       B Medical/Surgery History History reviewed. No pertinent past medical history. Past Surgical History:  Procedure Laterality Date  . knee surgery       A IV Location/Drains/Wounds Patient Lines/Drains/Airways Status   Active Line/Drains/Airways    Name:   Placement date:   Placement time:   Site:   Days:   Peripheral IV 11/10/18 Left Antecubital   11/10/18    1336    Antecubital   less than 1          Intake/Output Last 24 hours  Intake/Output Summary (Last 24 hours) at 11/10/2018 1553 Last data filed at 11/10/2018 1515 Gross per 24 hour  Intake 50 ml  Output -  Net 50 ml    Labs/Imaging Results for orders placed or performed during the hospital encounter of 11/10/18 (from the past 48 hour(s))  Basic metabolic panel     Status: None   Collection Time: 11/10/18  1:37 PM  Result Value Ref Range   Sodium 139 135 - 145 mmol/L   Potassium 4.3 3.5 - 5.1 mmol/L   Chloride 103 98 - 111 mmol/L   CO2 25 22 - 32 mmol/L   Glucose, Bld 96 70 - 99 mg/dL   BUN 10 6 - 20 mg/dL   Creatinine, Ser 0.94 0.61 - 1.24 mg/dL   Calcium 8.9 8.9 - 10.3 mg/dL   GFR calc non Af Amer >60 >60 mL/min    GFR calc Af Amer >60 >60 mL/min   Anion gap 11 5 - 15    Comment: Performed at [facility], [address] [place], [place] [address]  CBC with Differential     Status: None   Collection Time: 11/10/18  1:37 PM  Result Value Ref Range   WBC 9.5 4.0 - 10.5 K/uL   RBC 4.70 4.22 - 5.81 MIL/uL   Hemoglobin 14.1 13.0 - 17.0 g/dL   HCT 43.7 39.0 - 52.0 %   MCV 93.0 80.0 - 100.0 fL   MCH 30.0 26.0 - 34.0 pg   MCHC 32.3 30.0 - 36.0 g/dL   RDW 13.6 11.5 - 15.5 %   Platelets 359 150 - 400 K/uL   nRBC 0.0 0.0 - 0.2 %   Neutrophils Relative % 67 %   Neutro Abs 6.4 1.7 - 7.7 K/uL   Lymphocytes Relative 21 %   Lymphs Abs 2.0 0.7 - 4.0 K/uL   Monocytes Relative 10 %   Monocytes Absolute 0.9 0.1 - 1.0 K/uL   Eosinophils Relative 1 %   Eosinophils Absolute 0.1 0.0 - 0.5 K/uL   Basophils Relative 0 %   Basophils Absolute 0.0 0.0 -  0.1 K/uL   Immature Granulocytes 1 %   Abs Immature Granulocytes 0.07 0.00 - 0.07 K/uL    Comment: Performed at [facility], [address] [place], [place] [address]  Group A Strep by PCR     Status: None   Collection Time: 11/10/18  1:37 PM   Specimen: Throat; Sterile Swab  Result Value Ref Range   Group A Strep by PCR NOT DETECTED NOT DETECTED    Comment: Performed at [facility], [address] [place], [place] [address]   Ct Soft Tissue Neck W Contrast  Result Date: 11/10/2018 CLINICAL DATA:  Left neck pain and swelling. EXAM: CT NECK WITH CONTRAST TECHNIQUE: Multidetector CT imaging of the neck was performed using the standard protocol following the bolus administration of intravenous contrast. CONTRAST:  29mL OMNIPAQUE IOHEXOL 300 MG/ML  SOLN COMPARISON:  None. FINDINGS: Pharynx and larynx: Rim enhancing fluid collection in the left tonsil measures 26 x 11 x 27 mm compatible with peritonsillar abscess. This is causing mass-effect on the pharynx which is displaced to the right and narrowing the nasopharynx and posterior  oral pharyngeal airway. Larynx normal. Salivary glands: No inflammation, mass, or stone. Thyroid: Negative Lymph nodes: Enlarged left level 2 lymph node measuring 15 mm in short axis dimension. This is most consistent with peritonsillar abscess. No other pathologic lymph nodes in the neck. Numerous subcentimeter lymph nodes bilaterally. Vascular: Normal vascular enhancement bilaterally. Limited intracranial: Negative Visualized orbits: Not imaged Mastoids and visualized paranasal sinuses: Mild mucosal edema left maxillary sinus. Remaining sinuses clear. Skeleton: Cervical spondylosis.  Numerous dental caries. Upper chest: Left upper lobe mass lesion measuring 4.7 x 3.6 cm with surrounding infiltrate in the lung. Possible central necrosis with fluid density centrally. Right lung apex clear. No pleural effusion. Other: None IMPRESSION: 1. Left peritonsillar abscess measuring 26 x 11 x 27 mm with mass-effect on the pharyngeal airway. 2. Enlarged left level 2 lymph node 15 mm most compatible with peritonsillar abscess 3. Left upper lobe mass lesion 4.7 x 3.6 cm. This is suspicious for carcinoma lung although pneumonia is possible. Recommend CT chest with contrast for further evaluation. 4. These results were called by telephone at the time of interpretation on 11/10/2018 at 2:29 pm to provider [facility] , who verbally acknowledged these results. Electronically Signed   By: Franchot Gallo M.D.   On: 11/10/2018 14:33    Pending Labs Unresulted Labs (From admission, onward)    Start     Ordered   11/10/18 1508  SARS Coronavirus 2 [facility] order, Performed in [facility] hospital lab) Nasopharyngeal Nasopharyngeal Swab  (Symptomatic/High Risk of Exposure/Tier 1 Patients Labs with Precautions)  Once,   STAT    Question Answer Comment  Is this test for diagnosis or screening Screening   Symptomatic for COVID-19 as defined by CDC No   Hospitalized for COVID-19 No   Admitted to ICU for COVID-19 No   Previously  tested for COVID-19 No   Resident in a congregate (group) care setting No   Employed in healthcare setting No      11/10/18 1508          Vitals/Pain Today's Vitals   11/10/18 1223 11/10/18 1415 11/10/18 1420 11/10/18 1500  BP:   (!) 157/98 (!) 156/92  Pulse:   73 68  Resp:   18   Temp:      TempSrc:      SpO2:   97% 99%  PainSc: 9  5       Isolation Precautions Airborne and Contact precautions  Medications Medications  0.9 %  sodium chloride infusion (1,000 mLs Intravenous New Bag/Given 11/10/18 1418)  morphine 4 MG/ML injection 4 mg (4 mg Intravenous Given 11/10/18 1344)  clindamycin (CLEOCIN) IVPB 600 mg (0 mg Intravenous Stopped 11/10/18 1515)  dexamethasone (DECADRON) injection 10 mg (10 mg Intravenous Given 11/10/18 1344)  iohexol (OMNIPAQUE) 300 MG/ML solution 100 mL (75 mLs Intravenous Contrast Given 11/10/18 1352)    Mobility walks Low fall risk   Focused Assessments Pulmonary Assessment Handoff:  Lung sounds:   O2 Device: Room Air        R Recommendations: See Admitting Provider Note  Report given to:   Additional Notes: Pt resting. Ready for transport

## 2018-11-10 NOTE — ED Notes (Addendum)
Pt c/o left ear, throat and jaw pain.  Pt denies fever, reports having chills. Pt denies CP, sob, reports sister was having similar symptoms and prescribed abx which pt verbalizes as taking. Pt presents hypertensive, denies hx, or medication. 174/104

## 2018-11-10 NOTE — ED Notes (Signed)
Have pt call daughter when possible

## 2018-11-11 ENCOUNTER — Inpatient Hospital Stay (HOSPITAL_COMMUNITY): Payer: Medicaid Other

## 2018-11-11 ENCOUNTER — Encounter (HOSPITAL_COMMUNITY): Payer: Self-pay

## 2018-11-11 DIAGNOSIS — K769 Liver disease, unspecified: Secondary | ICD-10-CM

## 2018-11-11 LAB — CBC
HCT: 41.6 % (ref 39.0–52.0)
Hemoglobin: 13.7 g/dL (ref 13.0–17.0)
MCH: 29.9 pg (ref 26.0–34.0)
MCHC: 32.9 g/dL (ref 30.0–36.0)
MCV: 90.8 fL (ref 80.0–100.0)
Platelets: 401 10*3/uL — ABNORMAL HIGH (ref 150–400)
RBC: 4.58 MIL/uL (ref 4.22–5.81)
RDW: 13.3 % (ref 11.5–15.5)
WBC: 9.5 10*3/uL (ref 4.0–10.5)
nRBC: 0 % (ref 0.0–0.2)

## 2018-11-11 LAB — COMPREHENSIVE METABOLIC PANEL
ALT: 19 U/L (ref 0–44)
AST: 24 U/L (ref 15–41)
Albumin: 3.1 g/dL — ABNORMAL LOW (ref 3.5–5.0)
Alkaline Phosphatase: 68 U/L (ref 38–126)
Anion gap: 12 (ref 5–15)
BUN: 11 mg/dL (ref 6–20)
CO2: 23 mmol/L (ref 22–32)
Calcium: 9.3 mg/dL (ref 8.9–10.3)
Chloride: 100 mmol/L (ref 98–111)
Creatinine, Ser: 0.94 mg/dL (ref 0.61–1.24)
GFR calc Af Amer: >60 mL/min (ref >60)
GFR calc non Af Amer: >60 mL/min (ref >60)
Glucose, Bld: 153 mg/dL — ABNORMAL HIGH (ref 70–99)
Potassium: 4.1 mmol/L (ref 3.5–5.1)
Sodium: 135 mmol/L (ref 135–145)
Total Bilirubin: 0.4 mg/dL (ref 0.3–1.2)
Total Protein: 7.1 g/dL (ref 6.5–8.1)

## 2018-11-11 LAB — HIV ANTIBODY (ROUTINE TESTING W REFLEX): HIV Screen 4th Generation wRfx: NONREACTIVE

## 2018-11-11 MED ORDER — IOHEXOL 300 MG/ML  SOLN
75.0000 mL | Freq: Once | INTRAMUSCULAR | Status: AC | PRN
  Administered 2018-11-11: 75 mL via INTRAVENOUS

## 2018-11-11 MED ORDER — ENOXAPARIN SODIUM 40 MG/0.4ML ~~LOC~~ SOLN: 40.0000 mg | SUBCUTANEOUS | Status: DC

## 2018-11-11 MED ORDER — IOHEXOL 300 MG/ML  SOLN
100.0000 mL | Freq: Once | INTRAMUSCULAR | Status: AC | PRN
  Administered 2018-11-11: 100 mL via INTRAVENOUS

## 2018-11-11 NOTE — Progress Notes (Signed)
Spoke with IMTS was given the OK to allow the patient to shower with in room. Pt is alert and oriented X4 and has full purposeful movements and strength in all extremities.

## 2018-11-11 NOTE — H&P (Addendum)
Date: 11/10/2018              Patient Name:  Micheal Vincent MRN: [id number]  DOB: Jul 09, 1960 Age / Sex: 58 y.o., male   PCP: Patient, No Pcp Per         Medical Service: Internal Medicine Teaching Service         Attending Physician: Dr. Aldine Contes, MD    First Contact: Dr. Benjamine Mola Pager: [phone]  Second Contact: Dr. Berline Lopes Pager: [phone]       After Hours (After 5p/  First Contact Pager: [phone]  weekends / holidays): Second Contact Pager: [phone]   Chief Complaint: pain and difficulty swallowing  History of Present Illness: Patient is a 58 year old male with no significant past medical history who presents with a one-week history of throat pain and subjective fever/chills.  Patient reports that he has noticed pain while swallowing for the past week which has worsened and is more on the left side.  He denies history of food getting stuck, difficulty breathing.  Patient was seen at [facility] where a CT scan demonstrated left peritonsillar abscess in addition to a left upper lung mass.  Patient has had subjective fevers/chills for past week but has not measured his temperature.  Patient states that he otherwise feels good.  Patient denies cough, congestion, chest pain, shortness of breath, orthopnea, nausea, vomiting, abdominal pain, dysuria, swelling.  Meds:  Patient denies taking any medications.  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  Review of Systems: A complete ROS was negative except  as per HPI.   Physical Exam: Blood pressure (!) 151/97, pulse 73, temperature 98.2 F (36.8 C), temperature source Oral, resp. rate 16, height 6' (1.829 m), weight 73.5 kg, SpO2 97 %. Physical Exam Constitutional:      General: He is not in acute distress.    Appearance: He is well-developed. He is not diaphoretic.  HENT:     Head: Normocephalic and atraumatic.     Comments: Enlarged submandibular lymph nodes    Mouth/Throat:     Tonsils: Tonsillar abscess (S/p drainage ) present. No tonsillar exudate. 1+ on the left.  Eyes:     Conjunctiva/sclera: Conjunctivae normal.  Neck:     Musculoskeletal: Normal range of motion.  Cardiovascular:     Rate and Rhythm: Normal rate and regular rhythm.     Heart sounds: No murmur.  Pulmonary:     Effort: Pulmonary effort is normal. No respiratory distress.     Breath sounds: Normal breath sounds. No stridor.  Abdominal:     General: Bowel sounds are normal. There is no distension.     Palpations: Abdomen is soft.  Skin:    General: Skin is warm.     Capillary Refill: Capillary refill takes less than 2 seconds.  Neurological:     General: No focal deficit present.     Mental Status: He is alert and oriented to person, place, and time.    EKG: personally reviewed my interpretation is not obtained, will order AM EKG  CXR: personally reviewed my interpretation is not obtained  Assessment & Plan by Problem: Active Problems:   Peritonsillar abscess   Mass of upper lobe of left lung   Micheal Vincent is a 58 year old male with an insignificant past medical history who presents with progressively worsening odynophagia.  He is found to have a large left-sided peritonsillar abscess status post I&D by Dr. Blenda Nicely with [facility].  In addition the patient was found to have an incidental left upper lobe lung mass which will require further evaluation which in the setting of chronic tobacco use disorder is concerning for malignancy  but could potentially represent infection.  Peritonsillar abscess: Noted on CT soft tissue of the neck, 26 x 11 x 27 mm mass-effect of the pharyngeal airway.  He is status post I&D by ENT 's Dr. Blenda Nicely.  We appreciate their assistance with her patient.  Per ENTs recommendations we will advance his diet, continue Decadron and clindamycin. -Clindamycin 600 mg IV 3 times daily until he tolerates tablets for total of 10 days - Decadron 10 mg every 8 hours for total of 3 doses (2 doses remaining) - Advance to soft diet for 5 days then to regular diet thereafter -Monitor vitals and fever curve overnight  Mass of the left upper lobe: Left upper lobe mass lesion 4.7 x 3.6 cm which is suspicious for carcinoma but could represent infection.  This was noted on CT of the neck.  We will administer fluids overnight and repeat a CT of the chest in the morning allowing for the initial contrast to washout. - 100 mL's per hour normal saline - CT chest with contrast in a.m.  DVT prophylaxis: Enoxaparin Code: Full Fluids: 100 and mils an hour Diet: Soft Dispo: Admit patient to Inpatient with expected length of stay greater than 2 midnights.  Signed: Jeanmarie Hubert, MD 11/11/2018, 10:50 AM Pager: [phone]

## 2018-11-11 NOTE — Progress Notes (Signed)
  Subjective:  Pt seen at the bedside this AM. Says he does not have any chest pain or SOB. The patient is asking about bumps on his face and neck. We discussed that these are likely lipomas. We discussed the mass found on his neck CT and the need to get a chest CT to further evaluate it. The patient voiced understanding. The patient says he does not have a PCP.     Objective:    Vital Signs (last 24 hours): Vitals:   11/11/18 0200 11/11/18 0229 11/11/18 0729 11/11/18 1106  BP: (!) 141/91 (!) 143/97 (!) 151/97 (!) 159/88  Pulse: 67 72 73 84  Resp: 18 18 16 17   Temp:  99 F (37.2 C) 98.2 F (36.8 C) 98.1 F (36.7 C)  TempSrc:  Oral Oral Oral  SpO2: 96% 98% 97% 94%  Weight:  73.5 kg    Height: 6' (1.829 m)       Physical Exam: General Alert and answers questions appropriately, no acute distress  Cardiac Regular rate and rhythm, no murmurs, rubs, or gallops  Pulmonary Clear to auscultation bilaterally without wheezes, rhonchi, or rales  Abdominal Soft, non-tender, without distention. Bowel sounds present  ENT Incision site clean, dry, intact over left peritonsillar region.    Assessment/Plan:   Active Problems:   Peritonsillar abscess   Mass of upper lobe of left lung  Patient is a 58 year old male with no significant past medical history who presented yesterday with progressively worsening odynophagia.  He was found to have a large left-sided peritonsillar abscess status post I&D by ENT.  Patient also found to have incidental left upper lobe lung mass.  # Peritonsillar abscess: Noted on CT soft tissue of neck, 26 x 11 x 27 mm.  Status post drainage by ENT. *Clindamycin 600 mg IV 3 times daily (day 2 today) until he tolerates tablets, total duration of therapy 10 days *Received Decadron 10 mg every 8 hours yesterday total of 3 doses (therapy complete) *Soft diet for the next 4 days, then regular diet afterwards *ENT follow-up in clinic in 2 weeks  # Mass of left upper lobe:  Upper lobe mass evident on CT soft tissue neck.  CT chest with contrast performed today.  It demonstrates a 4.3 x 3.4 cm spiculated density in the left upper lobe with some central cavitation, concerning for malignancy.  There are also multiple rounded hypodense lesions in the liver, concerning for metastatic disease. * IR consulted for biopsy, may consider biopsy of lung lesion versus liver lesions.  Diet: Soft diet DVT Ppx: Lovenox 40 mg QD Dispo: Anticipated discharge pending clinical improvement  Jeanmarie Hubert, MD 11/11/2018, 1:47 PM Pager: [phone]

## 2018-11-11 NOTE — Progress Notes (Signed)
  Date: 11/11/2018  Patient name: Micheal Vincent  Medical record number: [id number]  Date of birth: 1960/04/27   I have seen and evaluated Micheal Vincent and discussed their care with the Residency Team.  In brief, patient is a 58 year old male with no significant past medical history who presented to the ED with worsening throat pain and fevers and chills over the last week.  Patient states that he noted pain in the back of his throat more on the left side than the right with swell as some difficulty swallowing.  He also noted some subjective fevers and chills.  He was seen at [facility] ED where he had a CT which showed a left peritonsillar abscess and a possible left upper lobe lung mass.  No chest pain, no shortness of breath, no palpitations, no lightheadedness, no syncope, no focal weakness, tingling or numbness, no headache, no blurry vision, no nausea or vomiting, no abdominal pain, no diarrhea.  Patient was referred to [facility] secondary to peritonsillar abscess as well as left upper lobe mass.  Patient denies any weight loss or decreased appetite  Patient status post I&D of left peritonsillar abscess yesterday.  He still complains of some mild pain on the left side but overall feels much better and is tolerating an oral diet.  PMHx, Fam Hx, and/or Soc Hx : As per resident admit note  Vitals:   11/11/18 0729 11/11/18 1106  BP: (!) 151/97 (!) 159/88  Pulse: 73 84  Resp: 16 17  Temp: 98.2 F (36.8 C) 98.1 F (36.7 C)  SpO2: 97% 94%   General: Awake, alert, oriented x3, NAD CVs: Regular rate and rhythm, normal heart sounds Lungs: CTA laterally Abdomen: Soft, nontender, nondistended, normoactive bowel sounds Extremities: No edema noted, nontender to palpation HEENT: Mild left tonsillar exudate noted, mild erythema noted as well Psych: Normal mood and affect Neuro: No focal deficits noted, oriented x3  Assessment and Plan: I have seen and evaluated the patient as outlined above. I  agree with the formulated Assessment and Plan as detailed in the residents' note, with the following changes:   1.  Left peritonsillar abscess: -Patient presented to the ED with worsening pain over the left side of his throat and subjective fevers and chills and was noted to have a left peritonsillar abscess on imaging.  He is status post I&D by ENT yesterday. -Patient continues to improve and states that he only has minimal pain now and is able to tolerate an oral diet -Continue with a soft diet for 5 more days -Continue with clindamycin to complete a 10-day course of antibiotics -ENT follow-up and recommendations appreciated -Patient to continue with warm salt water irrigations 3 times daily after meals for 5 days -Patient follow-up with ENT as an outpatient  2.  Left upper lobe lung mass: -Patient noted to have an incidental left upper lobe lung mass on imaging of his neck concerning for possible malignancy.  He denies any weight loss or loss of appetite or night sweats -CT chest confirms 4.3 x 3.4 cm spiculated density in the left upper lobe with central cavitation concerning for malignancy as well as multiple hypodense lesions in the liver concerning for metastatic disease -Patient will need biopsy of lung mass as well as referral to oncology. -We will consult IR for possible biopsy -No further work-up at this time  Aldine Contes, MD 9/23/20201:03 PM

## 2018-11-11 NOTE — Plan of Care (Signed)
  Problem: Education: Goal: Knowledge of General Education information will improve Description: Including pain rating scale, medication(s)/side effects and non-pharmacologic comfort measures Outcome: Progressing   Problem: Health Behavior/Discharge Planning: Goal: Ability to manage health-related needs will improve Outcome: Progressing   Problem: Clinical Measurements: Goal: Ability to maintain clinical measurements within normal limits will improve Outcome: Progressing Goal: Will remain free from infection Outcome: Progressing Goal: Diagnostic test results will improve Outcome: Progressing Goal: Respiratory complications will improve Outcome: Progressing Goal: Cardiovascular complication will be avoided Outcome: Progressing   Problem: Activity: Goal: Risk for activity intolerance will decrease Outcome: Progressing   Problem: Nutrition: Goal: Adequate nutrition will be maintained Outcome: Progressing   Problem: Coping: Goal: Level of anxiety will decrease Outcome: Progressing   Problem: Coping: Goal: Level of anxiety will decrease Outcome: Progressing   Problem: Elimination: Goal: Will not experience complications related to bowel motility Outcome: Progressing Goal: Will not experience complications related to urinary retention Outcome: Progressing

## 2018-11-11 NOTE — Progress Notes (Signed)
IR has been consulted for possible liver lesion biopsy - patient has been reviewed by Dr. Pascal Lux who recommends CT abd/pelvis hepatic protocol for further evaluation of the liver lesion seen on CT chest.   I have ordered the CT abd/pelvis hepatic protocol and I will place orders for the patient to be NPO after midnight tonight and for tomorrow's Lovenox dose to be held. Imaging will be reviewed with IR MD tomorrow morning for decision regarding liver biopsy.  Please call IR with questions or concerns.   Candiss Norse, PA-C

## 2018-11-11 NOTE — Plan of Care (Signed)

## 2018-11-11 NOTE — Progress Notes (Signed)
ENT Consult Progress Note  Subjective:  Did well overnight.  Reports improved throat pain.  Improved ability to take p.o.  He is eating well.  Got a CT of his chest earlier today.  No difficulty with breathing.  Afebrile.  Objective: Vitals:   11/11/18 0729 11/11/18 1106  BP: (!) 151/97 (!) 159/88  Pulse: 73 84  Resp: 16 17  Temp: 98.2 F (36.8 C) 98.1 F (36.7 C)  SpO2: 97% 94%    Physical Exam: CONSTITUTIONAL: well developed, nourished, no distress and alert and oriented x 3 CARDIOVASCULAR: normal rate and regular rhythm PULMONARY/CHEST WALL: effort normal and no stridor, no stertor, no dysphonia HENT: There is no trismus.  Uvular hydrops resolved.  Incision is clean and dry intact over the left peritonsillar region.  There is still some erythema and obvious asymmetry.  Overall soft palate edema is improved and vocal quality is improved.  There is no stridor.   Data Review/Consults/Discussions: CT chest concerning for metastatic disease including multiple concerning lesions in the left upper lobe and in the liver.   Assessment: Micheal Vincent 58 y.o. male who presents with left peritonsillar abscess was also incidentally found to have a lung mass.  He is status post incision and drainage.  Plan:  -Continue clindamycin for a total of 10 days Patient should rinse mouth out with warm salt water irrigations 3 times daily after meals for 5 days Soft diet for 5 more days He may follow-up with me in clinic in about 2 weeks, sooner if problems arise   Thank you for involving [facility] in the care of this patient. Should you need further assistance, please call our office at [phone].    Gavin Pound, MD

## 2018-11-12 ENCOUNTER — Inpatient Hospital Stay (HOSPITAL_COMMUNITY): Payer: Medicaid Other

## 2018-11-12 LAB — CBC
HCT: 40.8 % (ref 39.0–52.0)
Hemoglobin: 13.7 g/dL (ref 13.0–17.0)
MCH: 30 pg (ref 26.0–34.0)
MCHC: 33.6 g/dL (ref 30.0–36.0)
MCV: 89.5 fL (ref 80.0–100.0)
Platelets: 409 10*3/uL — ABNORMAL HIGH (ref 150–400)
RBC: 4.56 MIL/uL (ref 4.22–5.81)
RDW: 13.3 % (ref 11.5–15.5)
WBC: 13.3 10*3/uL — ABNORMAL HIGH (ref 4.0–10.5)
nRBC: 0 % (ref 0.0–0.2)

## 2018-11-12 MED ORDER — ENOXAPARIN SODIUM 40 MG/0.4ML ~~LOC~~ SOLN
40.0000 mg | SUBCUTANEOUS | Status: DC
  Administered 2018-11-12: 40 mg via SUBCUTANEOUS
  Filled 2018-11-12: qty 0.4

## 2018-11-12 NOTE — TOC Initial Note (Signed)
Transition of Care [facility]) - Initial/Assessment Note    Patient Details  Name: Micheal Vincent MRN: [id number] Date of Birth: [birth year]  Transition [facility]) CM/SW Contact:    Carles Collet, RN Phone Number: 11/12/2018, 12:07 PM  Clinical Narrative:        Damaris Schooner w patient at bedside.  He stated he will be discharged to stay with his daughter in [place] most of the time then sometimes he will stay some with his mother in [place]. CM asked where he would like to have me assign him a PCP, he stated [place]. Apt has been made at [facility], added to AVS.  CM will watch for med needs at [facility] and provide [facility] letter. If DC's M-F can utilize [facility] (they are not open on weekends).  Otherwise he would like to utilize [facility] on [address].  Clinical workup is ongoing for possible metastasized cancer. Will continue to follow for appropriate DC plan.             Expected Discharge Plan: [place] Barriers to Discharge: Continued Medical Work up   Patient Goals and CMS Choice Patient states their goals for this hospitalization and ongoing recovery are:: to stay with his daughter in [place]   Choice offered to / list presented to : NA(uninsured will be assigned to charity at [place])  Expected Discharge Plan and Services Expected Discharge Plan: [place]   Discharge Planning Services: CM Consult                                          Prior Living Arrangements/Services   Lives with:: Adult Children                   Activities of Daily Living Home Assistive Devices/Equipment: None ADL Screening (condition at time of admission) Patient's cognitive ability adequate to safely complete daily activities?: Yes Is the patient deaf or have difficulty hearing?: No Does the patient have difficulty seeing, even when wearing glasses/contacts?: No Does the patient have difficulty concentrating, remembering, or making decisions?:  No Patient able to express need for assistance with ADLs?: Yes Does the patient have difficulty dressing or bathing?: No Independently performs ADLs?: Yes (appropriate for developmental age) Does the patient have difficulty walking or climbing stairs?: No Weakness of Legs: None Weakness of Arms/Hands: None  Permission Sought/Granted                  Emotional Assessment              Admission diagnosis:  Peritonsillar abscess [J36] Lung mass [R91.8] Patient Active Problem List   Diagnosis Date Noted  . Peritonsillar abscess 11/10/2018  . Mass of upper lobe of left lung 11/10/2018   PCP:  Patient, No Pcp Per Pharmacy:   [id number] - [place], [place] [address] Phone: [phone] Fax: [place], [place] [address] [place] [address] Phone: [phone] Fax: [phone]  [facility] ([place] - [address] [place] [address] Phone: [phone] Fax: [phone]     Social Determinants of Health (SDOH) Interventions    Readmission Risk Interventions No flowsheet data  found.

## 2018-11-12 NOTE — Plan of Care (Signed)

## 2018-11-12 NOTE — Progress Notes (Addendum)
  Subjective:  Patient seen at bedside this morning.  Patient denies shortness of breath.  Patient reports some worsening of throat pain.  We discussed the plan for IR to evaluate for potential liver biopsy today.  Patient particular understanding, all questions answered.   Objective:    Vital Signs (last 24 hours): Vitals:   11/11/18 0729 11/11/18 1106 11/11/18 2307 11/12/18 0738  BP: (!) 151/97 (!) 159/88 (!) 150/94 (!) 139/91  Pulse: 73 84 69 71  Resp: 16 17    Temp: 98.2 F (36.8 C) 98.1 F (36.7 C) 99.1 F (37.3 C) 98.4 F (36.9 C)  TempSrc: Oral Oral Oral Oral  SpO2: 97% 94% 97% 98%  Weight:      Height:        Physical Exam: General Alert and answers questions appropriately, no acute distress  Cardiac Regular rate and rhythm, no murmurs, rubs, or gallops  Pulmonary Clear to auscultation bilaterally without wheezes, rhonchi, or rales  Abdominal Soft, non-tender, without distention. Bowel sounds present  ENT Incision site clean, dry, intact over left peritonsillar region, mild swelling present    Assessment/Plan:   Active Problems:   Peritonsillar abscess   Mass of upper lobe of left lung  Patient is a 58 year old male with no significant past medical history who presented yesterday with progressive worsening odynophagia.  He was found to have a left-sided peritonsillar abscess status post I&D by ENT.  Patient also found to have incidental left upper lobe lung mass with what appears to be metastasis to liver.  # Peritonsillar abscess: Noted on CT soft tissue of neck, 26 x 11 x 27 mm.  Status post drainage by ENT.  Patient does have worsening pain and elevation in white count today to 13.3.  However the worsening pain may be secondary to wearing off of Decadron.  And, the elevated white count may be secondary to the Decadron therapy.  Will reach out to ENT if continued worsening of symptoms, elevation in white count. *Clindamycin 600 mg IV 3 times daily (day 3 today)  until he tolerates tablets, total duration of therapy 10 days *Received Decadron 10 mg every 8 hours yesterday total of 3 doses (therapy complete) - last dose 0540 on 9/23 *Soft diet for the next 3 more days, then regular diet afterwards *ENT follow-up in clinic in 2 weeks  # Mass of left upper lobe: Upper lobe mass evident on CT soft tissue neck.  CT chest with contrast performed 9/23.  It demonstrates a 4.3 x 3.4 cm spiculated density in the left upper lobe with some central cavitation, concerning for malignancy.  There are also multiple rounded hypodense lesions in the liver, concerning for metastatic disease.  CT liver demonstrated 2.4 cm lesion in the medial right liver and an 11 mm lesion in the dome of left liver both of which may be hemangiomas. Since these may not represent lung metastasis, IR recommends MR abdomen for further evaluation * MR abdomen for further characterization of liver lesions, then IR to evaluate for biopsy  Diet: Soft diet DVT Ppx: Lovenox 40 mg QD Dispo: Anticipated discharge pending clinical improvement  Jeanmarie Hubert, MD 11/12/2018, 11:06 AM Pager: [phone]

## 2018-11-13 ENCOUNTER — Telehealth: Payer: Self-pay | Admitting: Acute Care

## 2018-11-13 ENCOUNTER — Inpatient Hospital Stay (HOSPITAL_COMMUNITY): Payer: Medicaid Other

## 2018-11-13 DIAGNOSIS — R918 Other nonspecific abnormal finding of lung field: Secondary | ICD-10-CM

## 2018-11-13 DIAGNOSIS — R911 Solitary pulmonary nodule: Secondary | ICD-10-CM

## 2018-11-13 LAB — CBC
HCT: 43.1 % (ref 39.0–52.0)
Hemoglobin: 14.6 g/dL (ref 13.0–17.0)
MCH: 30.4 pg (ref 26.0–34.0)
MCHC: 33.9 g/dL (ref 30.0–36.0)
MCV: 89.6 fL (ref 80.0–100.0)
Platelets: 417 10*3/uL — ABNORMAL HIGH (ref 150–400)
RBC: 4.81 MIL/uL (ref 4.22–5.81)
RDW: 13.2 % (ref 11.5–15.5)
WBC: 9.5 10*3/uL (ref 4.0–10.5)
nRBC: 0 % (ref 0.0–0.2)

## 2018-11-13 MED ORDER — CHLORHEXIDINE GLUCONATE 0.12 % MT SOLN: 15.0000 mL | Freq: Three times a day (TID) | OROMUCOSAL | 0 refills | Status: AC

## 2018-11-13 MED ORDER — CLINDAMYCIN HCL 150 MG PO CAPS: 450.0000 mg | ORAL_CAPSULE | Freq: Three times a day (TID) | ORAL | 0 refills | Status: AC

## 2018-11-13 MED ORDER — GADOBUTROL 1 MMOL/ML IV SOLN
7.5000 mL | Freq: Once | INTRAVENOUS | Status: AC | PRN
  Administered 2018-11-13: 7.5 mL via INTRAVENOUS

## 2018-11-13 MED FILL — CHLORHEXIDINE 0.12% RINSE: 0.12 | 10 days supply | Qty: 473 | Fill #0

## 2018-11-13 MED FILL — CLINDAMYCIN HCL 150 MG CAPS: 150 | 7 days supply | Qty: 63 | Fill #0

## 2018-11-13 NOTE — Telephone Encounter (Signed)
Please place an order for PET scan prior to 10/5  under Dr. Bari Mantis name to ensure results go to him. Pt is scheduled in the office 11/23/2018 at 9:30 am to determine if patient needs a bronch. Thanks so much

## 2018-11-13 NOTE — Progress Notes (Signed)
Micheal Vincent 58 y.o. male who presents with left peritonsillar abscess who was also found to have a lung mass as well as liver lesions.  IR consulted for biopsy of the liver lesions.  Dr. Pascal Lux recommends MR Abdomen which has characterized the liver lesions as hemangiomas.  Reviewed imaging with Dr. Kathlene Cote who agrees.  No potential for biopsy of the liver at this time.  The lung lesion also reviewed by Dr. Kathlene Cote who notes the location would be at increased risk for percutaneous approach.  Recommend further discussion with Pulmonology for possible bronchoscopy.   Patient does have an enlarged lymph node most consistent with his peritonsillar abscess.  Per Dr. Kathlene Cote could consider time to completely recover from peritonsillar abscess and obtain PET scan. Reconsult IR for consideration of percutaneous biopsy if lymph node is PET positive.   Spoke with Dr. Benjamine Mola.  Brynda Greathouse, MS RD PA-C 11:09 AM

## 2018-11-13 NOTE — Telephone Encounter (Signed)
Clarification-PET scan should  be done in 3 to 4 weeks

## 2018-11-13 NOTE — Care Management (Signed)
MATCH entered for patient.  Scripts sent to [facility], who will fill and deliver to patient.

## 2018-11-13 NOTE — Progress Notes (Signed)
  Subjective:  Patient seen at bedside this morning. Pt feels better today. Says he was able to eat a whopper last night without issues. Pt's last meal was last night but didn't eat breakfast.   Objective:    Vital Signs (last 24 hours): Vitals:   11/12/18 0738 11/12/18 1556 11/12/18 2329 11/13/18 0828  BP: (!) 139/91 (!) 157/96 (!) 148/93 125/78  Pulse: 71 (!) 54 87 68  Resp:  16 16 19   Temp: 98.4 F (36.9 C) 98.6 F (37 C) 99 F (37.2 C) 98.8 F (37.1 C)  TempSrc: Oral Oral Oral Oral  SpO2: 98% 99% 98% 96%  Weight:      Height:        Physical Exam: General Alert and answers questions appropriately, no acute distress  Cardiac Regular rate and rhythm, no murmurs, rubs, or gallops  Pulmonary Clear to auscultation bilaterally without wheezes, rhonchi, or rales  Abdominal Soft, non-tender, without distention. Bowel sounds present    Assessment/Plan:   Active Problems:   Peritonsillar abscess   Mass of upper lobe of left lung  Patient is a 58 year old male with no significant past medical history who presented yesterday with progressive worsening odynophagia.  He was found to have a left-sided peritonsillar abscess status post I&D by ENT.  Patient also found to have incidental left upper lobe lung mass with what appears to be metastasis to liver.  # Peritonsillar abscess: Noted on CT soft tissue of neck, 26 x 11 x 27 mm.  Status post drainage by ENT.  Patient with improvement of throat pain and white count has decreased today to within normal limits at 9.5.  *On day 4 of IV clindamycin, will discharge to complete oral course of clindamycin for total 10-day duration. *We will discharge with instructions to eat a soft diet for the next couple of days then gradually increase towards regular diet. *ENT follow-up in clinic in 2 weeks  # Mass of left upper lobe: Upper lobe mass evident on CT soft tissue neck.  CT chest with contrast performed 9/23.  It demonstrates a 4.3 x 3.4 cm  spiculated density in the left upper lobe with some central cavitation, concerning for malignancy.  There are also multiple rounded hypodense lesions in the liver, which on CT were concerning for metastatic disease.  However, upon further characterization on MR these are consistent with hemangiomas so not appropriate for biopsy to assess pulmonary mass.  Talked with pulmonology for consideration of bronchoscopy and biopsy.  Although patient does not have a white count, fever, signs of pneumonia, pulmonology is concerned that upper lobe mass may be secondary to infection and are recommending outpatient follow-up with plan for repeat imaging in 3-4 weeks.  IR also suggests that if after resolution of infection, the submandibular lymph nodes are hot on PET scan these can also be considered for biopsy. * We will discharge with close outpatient follow-up with pulmonology, internal medicine clinic, ENT  Dispo: Anticipated discharge today  Earlene Plater, MD 11/13/2018, 9:54 AM Pager: [phone]

## 2018-11-13 NOTE — Telephone Encounter (Signed)
Order for PET scan to be done in 3-4 weeks has been placed. Nothing further needed at this time.

## 2018-11-13 NOTE — Consult Note (Addendum)
NAME:  Airam Heidecker, MRN:  [id number], DOB:  1961/01/16, LOS: 3 ADMISSION DATE:  11/10/2018, CONSULTATION DATE:  11/13/18 REFERRING MD:  Lysbeth Galas - IMTS, CHIEF COMPLAINT:  Sore throat  Brief History   58 yo M admitted for peritonsilar abscess and found to have L lung mass. PCCM asked to see regarding possible bronch/biopsy  History of present illness   58 yo M PMG tobacco abuse presenting 9/23 with L throat pain, beginning 1 week prior. Associated productive cough, scratchy throat. Transferred from [facility] where imaging revealed  L peritonsilar abscess as well as LUL pulmonary lesion. I/D of abscess by ENT on 9/23, abx continued. Further CT revealed several hepatic lesions. PCCM curbsided regarding lung mass and at that time recommended further evaluation of live lesions for possible liver biopsy. MR abdomen suggestive of hepatic hemangiomas.  PCCM formally consulted 9/25 regarding possible lung biopsy.   Past Medical History  Tobacco abuse   Significant Hospital Events   9/23 ID of abscess   Consults:  ENT PCCM   Procedures:  I/D L peritonsilar abscess 9/23  Significant Diagnostic Tests:  9/23 CT chest> 4.3 x 3.4 cm spiculated mass with central cavitation in left upper lung.  9/25 MR Abdomen> 15 scattered hemangiomas in liver. Renal cysts.  Micro Data:  Strep A neg Covid 19 neg   Antimicrobials:  Clinda  Interim history/subjective:  "I'm just so glad that infection got popped"  Objective   Blood pressure 125/78, pulse 68, temperature 98.8 F (37.1 C), temperature source Oral, resp. rate 19, height 6' (1.829 m), weight 73.5 kg, SpO2 96 %.       No intake or output data in the 24 hours ending 11/13/18 1049 Filed Weights   11/11/18 0229  Weight: 73.5 kg    Examination: General: WDWN adult M NAD HENT: NCAT, trachea midline  Lungs: CTA, no accessory muscle use  Cardiovascular: RRR s1s2 Cap refill < 3 sec Abdomen: Soft ndnt Extremities: Symmetrical bulk and tone  without cyanosis or clubbing Neuro: AAOx4 following commands GU: defer   Resolved Hospital Problem list     Assessment & Plan:   Left upper lobe lung lesion -approximately 30pack year history -Family history of lung cancer -multiple hypodense hepatic lesions on CT however on MR abdomen liver lesions most consistent with hemangiomas in appearance  P -Discussed with Dr. Elsworth Soho PCCM--  In setting of active head/neck infection, recommend facilitating follow up imaging of lung lesion in 2-3 PET/CT -Will make clinic appointment in 2 weeks with OP Pulm: October 5th, 2020 at 9:30 AM with Dr. Elsworth Soho -From Pulm perspective, stable for discharge when primary feels appropriate  Best practice:  Diet: reg Mobility: as tolerated Code Status: Full Disposition: Anticipate upcoming discharge   Labs   CBC: Recent Labs  Lab 11/10/18 1337 11/11/18 0518 11/12/18 0404 11/13/18 0526  WBC 9.5 9.5 13.3* 9.5  NEUTROABS 6.4  --   --   --   HGB 14.1 13.7 13.7 14.6  HCT 43.7 41.6 40.8 43.1  MCV 93.0 90.8 89.5 89.6  PLT 359 401* 409* 417*    Basic Metabolic Panel: Recent Labs  Lab 11/10/18 1337 11/11/18 0518  NA 139 135  K 4.3 4.1  CL 103 100  CO2 25 23  GLUCOSE 96 153*  BUN 10 11  CREATININE 0.94 0.94  CALCIUM 8.9 9.3   GFR: Estimated Creatinine Clearance: 89.1 mL/min (by C-G formula based on SCr of 0.94 mg/dL). Recent Labs  Lab 11/10/18 1337 11/11/18  0518 11/12/18 0404 11/13/18 0526  WBC 9.5 9.5 13.3* 9.5    Liver Function Tests: Recent Labs  Lab 11/11/18 0518  AST 24  ALT 19  ALKPHOS 68  BILITOT 0.4  PROT 7.1  ALBUMIN 3.1*   No results for input(s): LIPASE, AMYLASE in the last 168 hours. No results for input(s): AMMONIA in the last 168 hours.  ABG No results found for: PHART, PCO2ART, PO2ART, HCO3, TCO2, ACIDBASEDEF, O2SAT   Coagulation Profile: No results for input(s): INR, PROTIME in the last 168 hours.  Cardiac Enzymes: No results for input(s): CKTOTAL, CKMB,  CKMBINDEX, TROPONINI in the last 168 hours.  HbA1C: Hgb A1c MFr Bld  Date/Time Value Ref Range Status  09/08/2007 06:30 PM (H)  Final   6.2 (NOTE)   The ADA recommends the following therapeutic goal for glycemic   control related to Hgb A1C measurement:   Goal of Therapy:   < 7.0% Hgb A1C   Reference: American Diabetes Association: Clinical Practice   Recommendations 2008, Diabetes Care,  2008, 31:(Suppl 1).    CBG: No results for input(s): GLUCAP in the last 168 hours.  Review of Systems:   Pertinent positive included recent infection, sore throat, cough.  No fever chills night sweats weight loss loss of appetite  Past Medical History  He,  has no past medical history on file.   Surgical History    Past Surgical History:  Procedure Laterality Date  . knee surgery       Social History   reports that he has been smoking cigarettes. He has been smoking about 1.50 packs per day. He has never used smokeless tobacco. He reports current alcohol use. He reports current drug use. Drug: Cocaine.   Family History   His family history is not on file.   Allergies No Known Allergies   Home Medications  Prior to Admission medications   Not on File     Eliseo Gum MSN, AGACNP-BC [place] [phone] If no answer, [phone] 11/13/2018, 10:49 AM

## 2018-11-14 NOTE — Discharge Summary (Signed)
Name: Micheal Vincent MRN: [id number] DOB: [birth year] 58 y.o. PCP: Patient, No Pcp Per  Date of Admission: 11/10/2018 12:09 PM Date of Discharge: 11/13/2018 Attending Physician: Dr. Dareen Piano   Discharge Diagnosis: 1.  Peritonsillar abscess 2.  Left upper lobe lung mass  Discharge Medications: Allergies as of 11/13/2018   No Known Allergies     Medication List    TAKE these medications   chlorhexidine 0.12 % solution Commonly known as: PERIDEX Use as directed 15 mLs in the mouth or throat 3 (three) times daily with meals for 4 days.   clindamycin 150 MG capsule Commonly known as: CLEOCIN Take 3 capsules (450 mg total) by mouth 3 (three) times daily for 7 days.       Disposition and follow-up:   [name] was discharged from [facility] in Good condition.  At the hospital follow up visit please address:  1.  Please assess patient's incision site from peritonsillar abscess I&D for signs of infection.  Please ensure patient has completed course of clindamycin.  Patient was advised at discharge to contact ENT for follow-up appointment.  Please ensure patient is aware of follow-up appointment with pulmonology on 11/23/2018 for the left upper lobe lung mass (see below).  2.  Labs / imaging needed at time of follow-up: None  3.  Pending labs/ test needing follow-up: None  Follow-up Appointments: Follow-up Information    Antony Blackbird, MD. Go on 01/11/2019.   Specialty: Family Medicine Why: at 10:30. This is to establish a primary care provider. It is important that you keep this appointment Contact information: [place] [address] [phone]        [facility]. Go on 11/23/2018.   Specialty: Pulmonology Why: October 5th, 2020 at 9:30 AM Contact information: [address] [place] [address] [place]. Schedule an appointment as soon as possible  for a visit in 1 week(s).   Contact information: [address] [address] [phone]       Helayne Seminole, MD. Schedule an appointment as soon as possible for a visit in 2 week(s).   Specialty: Otolaryngology Contact information: [address] [place] [address] [phone]           Hospital Course by problem list: # Peritonsillar abscess: Patient presented with progressively worsening odynophagia and was found to have a left-sided peritonsillar abscess. Dr. Blenda Nicely (ENT) performed I&D, patient was given Decadron 10 mg Q8HR for 3 doses, started on Peridex mouthwash, started on IV clindamycin.  Patient was started on soft diet with plans for gradual advancement, but on day of discharge patient self advanced to regular diet with outside food and was tolerating it without difficulty.  Patient remained afebrile during admission.  White count was WNL on admission, reached a high of 13.3 but returned to normal limits the following day and elevation was thought to be secondary to Decadron.  Patient was discharged on oral course of clindamycin to complete 10-day course.  # Mass of left upper lobe: The left upper lobe mass was incidentally found on CT neck.  CT chest performed with contrast on 9/23 and a demonstrate a 4.3 x 3.4 cm spiculated density in the left upper lobe with some central cavitation, concerning for malignancy.  There were hypodense lesions in the liver concerning for metastatic disease and IR was consulted for consideration of biopsy.  However, upon further characterization with MR these lesions were consistent with hemangiomas so not appropriate for biopsy to assess pulmonary mass.  We consulted pulmonology for consideration of bronchoscopy with biopsy. Pulmonology was concerned that the upper lobe mass may be secondary to infection in the setting of his peritonsillar abscess.  They recommended repeat imaging with PET/CT in 3-4  weeks. Patient was without a white count, fever, signs of pneumonia. IR also suggested that if after resolution of infection, the submandibular lymph nodes are hot on the PET scan these can also be considered for biopsy.  Patient discharged with follow-up appointment with Dr. Elsworth Soho (pulmonology) on 11/23/2018.  Discharge Vitals:   BP 125/78   Pulse 68   Temp 98.8 F (37.1 C) (Oral)   Resp 19   Ht 6' (1.829 m)   Wt 73.5 kg   SpO2 96%   BMI 21.98 kg/m   Pertinent Labs, Studies, and Procedures:  CBC Latest Ref Rng & Units 11/13/2018 11/12/2018 11/11/2018  WBC 4.0 - 10.5 K/uL 9.5 13.3(H) 9.5  Hemoglobin 13.0 - 17.0 g/dL 14.6 13.7 13.7  Hematocrit 39.0 - 52.0 % 43.1 40.8 41.6  Platelets 150 - 400 K/uL 417(H) 409(H) 401(H)   BMP Latest Ref Rng & Units 11/11/2018 11/10/2018 05/10/2013  Glucose 70 - 99 mg/dL 153(H) 96 156(H)  BUN 6 - 20 mg/dL 11 10 13   Creatinine 0.61 - 1.24 mg/dL 0.94 0.94 1.20  Sodium 135 - 145 mmol/L 135 139 142  Potassium 3.5 - 5.1 mmol/L 4.1 4.3 4.2  Chloride 98 - 111 mmol/L 100 103 103  CO2 22 - 32 mmol/L 23 25 25   Calcium 8.9 - 10.3 mg/dL 9.3 8.9 9.6   CT Soft Tissue Neck W Contrast (11/10/2018): IMPRESSION: 1. Left peritonsillar abscess measuring 26 x 11 x 27 mm with mass-effect on the pharyngeal airway. 2. Enlarged left level 2 lymph node 15 mm most compatible with peritonsillar abscess 3. Left upper lobe mass lesion 4.7 x 3.6 cm. This is suspicious for carcinoma lung although pneumonia is possible. Recommend CT chest with contrast for further evaluation. 4. These results were called by telephone at the time of interpretation on 11/10/2018 at 2:29 pm to provider [facility] , who verbally acknowledged these results.  CT Chest W Contrast (11/11/2018): IMPRESSION: 4.3 x 3.4 cm spiculated density is noted posteriorly in the left upper lobe along the major fissure with some central cavitation present. Mild surrounding interstitial densities are noted. This is  concerning for malignancy. PET scan is recommended for further evaluation.  Multiple rounded hypodense lesions are noted in the liver, the largest measuring 2.3 cm in right hepatic lobe. These are concerning for possible metastatic disease, and further evaluation with MRI with and without gadolinium is recommended.  CT Liver Abdomen W WO Contrast + CT Pelvis W Contrast (11/11/2018): IMPRESSION: 1. 2.4 cm lesion in the medial right liver may be a cavernous hemangioma but does not have entirely characteristic enhancement features. MRI of the abdomen without and with contrast recommended to further evaluate. 2. 11 mm lesion in the dome of the left liver also likely hemangioma. This could be further assessed at the time of follow-up MRI. 3. 15 mm left renal cyst with other tiny low-density renal lesions, too small to characterize. 4. Trace free fluid in the pelvis, etiology indeterminate. 5.  Aortic Atherosclerois (ICD10-170.0)  MR Abdomen W WO Contrast (11/13/2018): IMPRESSION: 1. There are approximately 15 scattered hemangiomas in the liver compatible with hepatic hemangiomatosis. 2. Bilateral Bosniak category  1 and Bosniak category 2 renal cysts, benign. 3. Left upper lobe lung mass partially seen. 4. Aortic Atherosclerosis (ICD10-I70.0).   Discharge Instructions: Discharge Instructions    Diet - low sodium heart healthy   Complete by: As directed    Discharge instructions   Complete by: As directed    You were seen in the hospital for an abscess in your throat. The ENT doctors drained this abscess and you received antibiotics in the hospital. We have given you a prescription for an antibiotic (clindamycin) which we want you to take for 7 days.  Please take all your medications as prescribed.  Please continue to eat soft foods for the next couple of days and slowly go to more solid foods.  When doing imaging, we found a mass in your left lung.  The pulmonary doctors want to do  another imaging study of this mass as an outpatient.  They might then decide to do a biopsy of that mass.  You have a follow-up appointment with these pulmonary doctors, it is important that you go to the appointment.  We also have set you up with an appointment in the internal medicine clinic.  If you do not hear from this clinic on Monday, please call the provided number to schedule appointment for a follow-up within 1 week.   Increase activity slowly   Complete by: As directed       Signed: Jeanmarie Hubert, MD 11/14/2018, 6:32 PM   Pager: [phone]

## 2018-11-19 ENCOUNTER — Ambulatory Visit (INDEPENDENT_AMBULATORY_CARE_PROVIDER_SITE_OTHER): Payer: Self-pay | Admitting: Internal Medicine

## 2018-11-19 ENCOUNTER — Other Ambulatory Visit: Payer: Self-pay

## 2018-11-19 ENCOUNTER — Encounter: Payer: Self-pay | Admitting: Internal Medicine

## 2018-11-19 VITALS — BP 119/86 | HR 93 | Temp 98.4°F | Ht 72.0 in | Wt 166.4 lb

## 2018-11-19 DIAGNOSIS — J36 Peritonsillar abscess: Secondary | ICD-10-CM

## 2018-11-19 DIAGNOSIS — K0889 Other specified disorders of teeth and supporting structures: Secondary | ICD-10-CM

## 2018-11-19 DIAGNOSIS — Z720 Tobacco use: Secondary | ICD-10-CM

## 2018-11-19 DIAGNOSIS — F1490 Cocaine use, unspecified, uncomplicated: Secondary | ICD-10-CM

## 2018-11-19 DIAGNOSIS — F17210 Nicotine dependence, cigarettes, uncomplicated: Secondary | ICD-10-CM

## 2018-11-19 DIAGNOSIS — Z23 Encounter for immunization: Secondary | ICD-10-CM

## 2018-11-19 DIAGNOSIS — R918 Other nonspecific abnormal finding of lung field: Secondary | ICD-10-CM

## 2018-11-19 NOTE — Assessment & Plan Note (Addendum)
Left upper lobe mass was noted incidentally on Neck CT during recent admission for peritonsillar abscess. Mass was noted to have speculation and cavitation. Given his smoking history this is concerning for a cancerous mass. Pulmonology saw him while admitted and have arranged for outpatient follow up as well as a follow up PET/CT. Smoking cessation encouraged and he is contemplative, but not ready to quit yet. - Follow up with pulmonology - PET/CT as ordered - Counseled on smoking cessation

## 2018-11-19 NOTE — Patient Instructions (Addendum)
Thank you for allowing [place] to care for you  For your dental abscess - Complete clindamycin as prescribed - Follow up with ENT (Dr. Blenda Nicely) - Call [phone] to make an appointment  For Lung mass - Follow up with Dr. Elsworth Soho (pulmonology)  Please follow up with out financial counselor as scheduled  Follow up with new primary doctor here in about 3 months (after you have arranged you insurance)

## 2018-11-19 NOTE — Assessment & Plan Note (Signed)
Counseled on cessation 

## 2018-11-19 NOTE — Assessment & Plan Note (Addendum)
Patient presenting for follow up of admission for peritonsillar abscess. He was treated surgically by ENT with I&D while admitted. He was also started on a course of clindamycin, which he did pick up on discharge and has been taking. He denies further pain or drainage. He has been able to tolerate food well. Surgical site is clean and well healing. He has not arrange follow up with ENT, but I was able to get there number from the chart and include this in his paperwork. - Continue clindamycin - Follow up with ENT

## 2018-11-19 NOTE — Progress Notes (Signed)
CC: Peritonsillar abcess, Lung Mass, Hospital follow up, Establish Care  HPI:  Mr.Angelica Eriksson is a 58 y.o. M with PMHx listed below presenting for Peritonsillar abscess, Lung Mass, Hospital follow up, Establish Care. Please see the A&P for the status of the patient's chronic medical problems.   Past Medical History:  Diagnosis Date  . Septic arthritis [facility])     Past Surgical History:  Procedure Laterality Date  . knee surgery     Social History   Socioeconomic History  . Marital status: Divorced    Spouse name: Not on file  . Number of children: Not on file  . Years of education: Not on file  . Highest education level: Not on file  Occupational History  . Not on file  Social Needs  . Financial resource strain: Not on file  . Food insecurity    Worry: Not on file    Inability: Not on file  . Transportation needs    Medical: Not on file    Non-medical: Not on file  Tobacco Use  . Smoking status: Current Every Day Smoker    Packs/day: 1.50    Types: Cigarettes  . Smokeless tobacco: Never Used  Substance and Sexual Activity  . Alcohol use: Yes    Comment: 1-2 drinks per week  . Drug use: Yes    Frequency: 6.0 times per week    Types: Cocaine, Marijuana  . Sexual activity: Yes    Birth control/protection: Other-see comments    Comment: intermittently  Lifestyle  . Physical activity    Days per week: Not on file    Minutes per session: Not on file  . Stress: Not on file  Relationships  . Social Herbalist on phone: Not on file    Gets together: Not on file    Attends religious service: Not on file    Active member of club or organization: Not on file    Attends meetings of clubs or organizations: Not on file    Relationship status: Not on file  . Intimate partner violence    Fear of current or ex partner: Not on file    Emotionally abused: Not on file    Physically abused: Not on file    Forced sexual activity: Not on file  Other Topics  Concern  . Not on file  Social History Narrative  . Not on file   Family History  Problem Relation Age of Onset  . Diabetes Mother   . Hypertension Mother   . Heart disease Mother   . Cancer Father   . Cancer Maternal Aunt     Review of Systems: Performed and all others negative.  Physical Exam:  Vitals:   11/19/18 1440  BP: 119/86  Pulse: 93  Temp: 98.4 F (36.9 C)  TempSrc: Oral  SpO2: 100%  Weight: 166 lb 6.4 oz (75.5 kg)  Height: 6' (1.829 m)   Physical Exam Constitutional:      General: He is not in acute distress.    Appearance: Normal appearance.  HENT:     Mouth/Throat:     Comments: Poor dentition Surgical site clean and well healing Cardiovascular:     Rate and Rhythm: Normal rate and regular rhythm.     Pulses: Normal pulses.     Heart sounds: Normal heart sounds.  Pulmonary:     Effort: Pulmonary effort is normal. No respiratory distress.     Breath sounds: Normal breath sounds.  Abdominal:  General: Bowel sounds are normal. There is no distension.     Palpations: Abdomen is soft.     Tenderness: There is no abdominal tenderness.  Musculoskeletal:        General: No swelling or deformity.  Skin:    General: Skin is warm and dry.     Comments: Chronic nodules located at R zygomatic arch and Posterior neck  Neurological:     General: No focal deficit present.     Mental Status: Mental status is at baseline.  Psychiatric:        Mood and Affect: Mood normal.        Behavior: Behavior normal.    Assessment & Plan:   See Encounters Tab for problem based charting.  Patient discussed with Dr. Evette Doffing

## 2018-11-22 NOTE — Progress Notes (Signed)
Internal Medicine Clinic Attending  Case discussed with Dr. Melvin  at the time of the visit.  We reviewed the resident's history and exam and pertinent patient test results.  I agree with the assessment, diagnosis, and plan of care documented in the resident's note.  

## 2018-11-23 ENCOUNTER — Telehealth: Payer: Self-pay | Admitting: General Surgery

## 2018-11-23 ENCOUNTER — Institutional Professional Consult (permissible substitution): Payer: Self-pay | Admitting: Pulmonary Disease

## 2018-11-23 ENCOUNTER — Encounter: Payer: Self-pay | Admitting: General Surgery

## 2018-11-23 NOTE — Telephone Encounter (Signed)
Second attempt to reach the patient. Tried to call on main contact number, but the vmail was full and not accepting messages.   Called patient on [phone], spoke with patient sister who stated the patient was not at home. I left a message with her to have him call our office because Dr. Elsworth Soho needs him to have chest xray and see him (Dr. Dr. Elsworth Soho, this week) before he has the PET scan scheduled on 12/08/18.  She said she will give him the message to call tomorrow morning.

## 2018-12-08 ENCOUNTER — Ambulatory Visit (HOSPITAL_COMMUNITY): Payer: Self-pay

## 2018-12-09 ENCOUNTER — Ambulatory Visit: Payer: Self-pay

## 2019-01-04 ENCOUNTER — Encounter (HOSPITAL_COMMUNITY): Payer: Self-pay | Admitting: Emergency Medicine

## 2019-01-04 ENCOUNTER — Emergency Department (HOSPITAL_COMMUNITY)
Admission: EM | Admit: 2019-01-04 | Discharge: 2019-01-05 | Disposition: A | Discharge status: Home / Self Care | Payer: Medicaid Other | Attending: Emergency Medicine | Admitting: Emergency Medicine

## 2019-01-04 ENCOUNTER — Other Ambulatory Visit: Payer: Self-pay

## 2019-01-04 DIAGNOSIS — Y929 Unspecified place or not applicable: Secondary | ICD-10-CM | POA: Insufficient documentation

## 2019-01-04 DIAGNOSIS — S39012A Strain of muscle, fascia and tendon of lower back, initial encounter: Secondary | ICD-10-CM | POA: Insufficient documentation

## 2019-01-04 DIAGNOSIS — R519 Headache, unspecified: Secondary | ICD-10-CM | POA: Insufficient documentation

## 2019-01-04 DIAGNOSIS — Y999 Unspecified external cause status: Secondary | ICD-10-CM | POA: Insufficient documentation

## 2019-01-04 DIAGNOSIS — F1290 Cannabis use, unspecified, uncomplicated: Secondary | ICD-10-CM | POA: Insufficient documentation

## 2019-01-04 DIAGNOSIS — F1490 Cocaine use, unspecified, uncomplicated: Secondary | ICD-10-CM | POA: Diagnosis not present

## 2019-01-04 DIAGNOSIS — X58XXXA Exposure to other specified factors, initial encounter: Secondary | ICD-10-CM | POA: Insufficient documentation

## 2019-01-04 DIAGNOSIS — G44209 Tension-type headache, unspecified, not intractable: Secondary | ICD-10-CM

## 2019-01-04 DIAGNOSIS — F17210 Nicotine dependence, cigarettes, uncomplicated: Secondary | ICD-10-CM | POA: Insufficient documentation

## 2019-01-04 DIAGNOSIS — Y939 Activity, unspecified: Secondary | ICD-10-CM | POA: Diagnosis not present

## 2019-01-04 NOTE — ED Triage Notes (Signed)
Pt complains of headache for 3-4 days and left sided back pain that been going on for a week.

## 2019-01-05 ENCOUNTER — Emergency Department (HOSPITAL_COMMUNITY): Payer: Medicaid Other

## 2019-01-05 MED ORDER — METOCLOPRAMIDE HCL 5 MG/ML IJ SOLN
5.0000 mg | Freq: Once | INTRAMUSCULAR | Status: AC
  Administered 2019-01-05: 10:00:00 5 mg via INTRAVENOUS
  Filled 2019-01-05: qty 2

## 2019-01-05 MED ORDER — METHOCARBAMOL 500 MG PO TABS: 500.0000 mg | ORAL_TABLET | Freq: Two times a day (BID) | ORAL | 0 refills | Status: DC

## 2019-01-05 MED ORDER — SODIUM CHLORIDE 0.9 % IV BOLUS
500.0000 mL | Freq: Once | INTRAVENOUS | Status: AC
  Administered 2019-01-05: 500 mL via INTRAVENOUS

## 2019-01-05 MED ORDER — DIPHENHYDRAMINE HCL 50 MG/ML IJ SOLN
12.5000 mg | Freq: Once | INTRAMUSCULAR | Status: AC
  Administered 2019-01-05: 10:00:00 12.5 mg via INTRAVENOUS
  Filled 2019-01-05: qty 1

## 2019-01-05 MED ORDER — METHOCARBAMOL 500 MG PO TABS
500.0000 mg | ORAL_TABLET | Freq: Once | ORAL | Status: AC
  Administered 2019-01-05: 500 mg via ORAL
  Filled 2019-01-05: qty 1

## 2019-01-05 MED FILL — METHOCARBAMOL 500 MG TABLET: 500 | 10 days supply | Qty: 20 | Fill #0

## 2019-01-05 NOTE — Discharge Instructions (Signed)
Take the muscle relaxer to help with your back pain. Continue your home medications as previously prescribed. Return to the ED if you start to have worsening symptoms, increasing back pain or headache, numbness in arms or legs, blurry vision, injuries or falls, chest pain.

## 2019-01-05 NOTE — ED Provider Notes (Signed)
[facility] Provider Note   CSN: [id number] Arrival date & time: 01/04/19  1702     History   Chief Complaint Chief Complaint  Patient presents with   Headache   Back Pain    HPI Micheal [facility] is a 58 y.o. male with a past medical history of tobacco abuse, left lung mass presenting to the ED with a chief complaint of headache.  The past 4 days been having intermittent generalized headache.  He has tried NyQuil with only mild improvement in his symptoms.  States that the pain will improve if he is sitting down but will worsen after he does any type of activity.  He denies history of headache in the past.  He denies any blurry vision, vomiting, numbness in arms or legs, injuries or falls.  He was diagnosed with a left lung mass of his hospitalization 1 month ago but has not yet followed up for a biopsy or PET scan. His next complaint is left-sided back pain.  Symptoms have been going on for a week.  Describes the pain as aching, worse with movement and palpation.  He reports history of similar symptoms in the past.  He has not tried medications up with the symptoms.  Denies any chest pain, shortness of breath, fever, leg swelling.     HPI  Past Medical History:  Diagnosis Date   Septic arthritis [facility])     Patient Active Problem List   Diagnosis Date Noted   Tobacco use 11/19/2018   Cocaine use 11/19/2018   Peritonsillar abscess 11/10/2018   Mass of upper lobe of left lung 11/10/2018    Past Surgical History:  Procedure Laterality Date   knee surgery          Home Medications    Prior to Admission medications   Medication Sig Start Date End Date Taking? Authorizing Provider  methocarbamol (ROBAXIN) 500 MG tablet Take 1 tablet (500 mg total) by mouth 2 (two) times daily. 01/05/19   Delia Heady, PA-C    Family History Family History  Problem Relation Age of Onset   Diabetes Mother    Hypertension Mother    Heart  disease Mother    Cancer Father    Cancer Maternal Aunt     Social History Social History   Tobacco Use   Smoking status: Current Every Day Smoker    Packs/day: 1.50    Types: Cigarettes   Smokeless tobacco: Never Used  Substance Use Topics   Alcohol use: Yes    Comment: 1-2 drinks per week   Drug use: Yes    Frequency: 6.0 times per week    Types: Cocaine, Marijuana     Allergies   Patient has no known allergies.   Review of Systems Review of Systems  Constitutional: Negative for appetite change, chills and fever.  HENT: Negative for ear pain, rhinorrhea, sneezing and sore throat.   Eyes: Negative for photophobia and visual disturbance.  Respiratory: Negative for cough, chest tightness, shortness of breath and wheezing.   Cardiovascular: Negative for chest pain and palpitations.  Gastrointestinal: Negative for abdominal pain, blood in stool, constipation, diarrhea, nausea and vomiting.  Genitourinary: Negative for dysuria, hematuria and urgency.  Musculoskeletal: Positive for back pain. Negative for myalgias.  Skin: Negative for rash.  Neurological: Positive for headaches. Negative for dizziness, weakness and light-headedness.     Physical Exam Updated Vital Signs BP 111/73 (BP Location: Right Arm)    Pulse 91  Temp 99.5 F (37.5 C) (Oral)    Resp 20    SpO2 98%   Physical Exam Vitals signs and nursing note reviewed.  Constitutional:      General: He is not in acute distress.    Appearance: He is well-developed.  HENT:     Head: Normocephalic and atraumatic.     Nose: Nose normal.  Eyes:     General: No scleral icterus.       Left eye: No discharge.     Conjunctiva/sclera: Conjunctivae normal.  Neck:     Musculoskeletal: Normal range of motion and neck supple.  Cardiovascular:     Rate and Rhythm: Normal rate and regular rhythm.     Heart sounds: Normal heart sounds. No murmur. No friction rub. No gallop.   Pulmonary:     Effort: Pulmonary  effort is normal. No respiratory distress.     Breath sounds: Normal breath sounds.  Abdominal:     General: Bowel sounds are normal. There is no distension.     Palpations: Abdomen is soft.     Tenderness: There is no abdominal tenderness. There is no guarding.  Musculoskeletal: Normal range of motion.     Lumbar back: He exhibits tenderness.       Back:     Comments: No midline spinal tenderness present in lumbar, thoracic or cervical spine. No step-off palpated. No visible bruising, edema or temperature change noted. No objective signs of numbness present. No saddle anesthesia. 2+ DP pulses bilaterally. Sensation intact to light touch. Strength 5/5 in bilateral lower extremities.  Skin:    General: Skin is warm and dry.     Findings: No rash.  Neurological:     General: No focal deficit present.     Mental Status: He is alert and oriented to person, place, and time.     Cranial Nerves: No cranial nerve deficit.     Sensory: No sensory deficit.     Motor: No weakness or abnormal muscle tone.     Coordination: Coordination normal.      ED Treatments / Results  Labs (all labs ordered are listed, but only abnormal results are displayed) Labs Reviewed - No data to display  EKG None  Radiology Ct Head Wo Contrast  Result Date: 01/05/2019 CLINICAL DATA:  Headaches for 4 days.  Left-sided back pain. EXAM: CT HEAD WITHOUT CONTRAST TECHNIQUE: Contiguous axial images were obtained from the base of the skull through the vertex without intravenous contrast. COMPARISON:  None. FINDINGS: Brain: No evidence of acute infarction, hemorrhage, hydrocephalus, extra-axial collection or mass lesion/mass effect. Vascular: No hyperdense vessel or unexpected calcification. Skull: No osseous abnormality. Sinuses/Orbits: Visualized paranasal sinuses are clear. Visualized mastoid sinuses are clear. Visualized orbits demonstrate no focal abnormality. Other: None IMPRESSION: No acute intracranial pathology.  Electronically Signed   By: Kathreen Devoid   On: 01/05/2019 10:47    Procedures Procedures (including critical care time)  Medications Ordered in ED Medications  metoCLOPramide (REGLAN) injection 5 mg (5 mg Intravenous Given 01/05/19 0940)  diphenhydrAMINE (BENADRYL) injection 12.5 mg (12.5 mg Intravenous Given 01/05/19 0939)  sodium chloride 0.9 % bolus 500 mL (500 mLs Intravenous New Bag/Given 01/05/19 0936)  methocarbamol (ROBAXIN) tablet 500 mg (500 mg Oral Given 01/05/19 0936)     Initial Impression / Assessment and Plan / ED Course  I have reviewed the triage vital signs and the nursing notes.  Pertinent labs & imaging results that were available during my care of the patient  were reviewed by me and considered in my medical decision making (see chart for details).        58 year old male with a past medical history of tobacco abuse and left lung mass presenting to the ED with a chief complaint of headache.  Generalized headache for the past 4 days with only mild improvement with NyQuil.  Denies history of headache in the past.  On exam there are no deficits neurological exam.  He remains amatory without difficulty.  He also complains of left-sided lower back pain.  Pain is reproducible on exam of the paraspinal musculature.  CT of the head is negative.  Patient reports significant improvement in his symptoms with muscle relaxer and a migraine cocktail.  Suspect that his symptoms are due to migraine or tension headache.  Suspect musculoskeletal cause of his back pain.  Doubt dissection, cauda equina or other surgical emergent cause of his back pain.  Will treat with muscle relaxer and have him follow-up with PCP.  Patient is hemodynamically stable, in NAD, and able to ambulate in the ED. Evaluation does not show pathology that would require ongoing emergent intervention or inpatient treatment. I explained the diagnosis to the patient. Pain has been managed and has no complaints prior to  discharge. Patient is comfortable with above plan and is stable for discharge at this time. All questions were answered prior to disposition. Strict return precautions for returning to the ED were discussed. Encouraged follow up with PCP.   An After Visit Summary was printed and given to the patient.   Portions of this note were generated with [employer]. Dictation errors may occur despite best attempts at proofreading.  Final Clinical Impressions(s) / ED Diagnoses   Final diagnoses:  Strain of lumbar region, initial encounter  Acute non intractable tension-type headache    ED Discharge Orders         Ordered    methocarbamol (ROBAXIN) 500 MG tablet  2 times daily     01/05/19 [address], PA-C 01/05/19 1111    Wyvonnia Dusky, MD 01/06/19 [phone]

## 2019-01-11 ENCOUNTER — Ambulatory Visit: Payer: Self-pay

## 2019-01-12 ENCOUNTER — Inpatient Hospital Stay (HOSPITAL_COMMUNITY): Payer: Medicaid Other

## 2019-01-12 ENCOUNTER — Inpatient Hospital Stay (HOSPITAL_COMMUNITY)
Admission: EM | Admit: 2019-01-12 | Discharge: 2019-01-21 | DRG: 987 | Disposition: A | Discharge status: Home / Self Care | Payer: Medicaid Other | Attending: Internal Medicine | Admitting: Internal Medicine

## 2019-01-12 ENCOUNTER — Encounter (HOSPITAL_COMMUNITY): Payer: Self-pay

## 2019-01-12 ENCOUNTER — Other Ambulatory Visit: Payer: Self-pay

## 2019-01-12 ENCOUNTER — Emergency Department (HOSPITAL_COMMUNITY): Payer: Medicaid Other

## 2019-01-12 DIAGNOSIS — R519 Headache, unspecified: Secondary | ICD-10-CM | POA: Diagnosis not present

## 2019-01-12 DIAGNOSIS — F1410 Cocaine abuse, uncomplicated: Secondary | ICD-10-CM | POA: Diagnosis present

## 2019-01-12 DIAGNOSIS — Z809 Family history of malignant neoplasm, unspecified: Secondary | ICD-10-CM

## 2019-01-12 DIAGNOSIS — Z79899 Other long term (current) drug therapy: Secondary | ICD-10-CM | POA: Diagnosis not present

## 2019-01-12 DIAGNOSIS — T380X5A Adverse effect of glucocorticoids and synthetic analogues, initial encounter: Secondary | ICD-10-CM | POA: Diagnosis not present

## 2019-01-12 DIAGNOSIS — Z9889 Other specified postprocedural states: Secondary | ICD-10-CM

## 2019-01-12 DIAGNOSIS — R7303 Prediabetes: Secondary | ICD-10-CM | POA: Diagnosis present

## 2019-01-12 DIAGNOSIS — C3492 Malignant neoplasm of unspecified part of left bronchus or lung: Secondary | ICD-10-CM | POA: Diagnosis not present

## 2019-01-12 DIAGNOSIS — Z716 Tobacco abuse counseling: Secondary | ICD-10-CM | POA: Diagnosis not present

## 2019-01-12 DIAGNOSIS — R918 Other nonspecific abnormal finding of lung field: Secondary | ICD-10-CM

## 2019-01-12 DIAGNOSIS — Z7289 Other problems related to lifestyle: Secondary | ICD-10-CM | POA: Diagnosis not present

## 2019-01-12 DIAGNOSIS — J984 Other disorders of lung: Secondary | ICD-10-CM | POA: Diagnosis not present

## 2019-01-12 DIAGNOSIS — R590 Localized enlarged lymph nodes: Secondary | ICD-10-CM | POA: Diagnosis present

## 2019-01-12 DIAGNOSIS — G935 Compression of brain: Secondary | ICD-10-CM | POA: Diagnosis present

## 2019-01-12 DIAGNOSIS — Z590 Homelessness: Secondary | ICD-10-CM

## 2019-01-12 DIAGNOSIS — C3412 Malignant neoplasm of upper lobe, left bronchus or lung: Secondary | ICD-10-CM | POA: Diagnosis present

## 2019-01-12 DIAGNOSIS — Z20828 Contact with and (suspected) exposure to other viral communicable diseases: Secondary | ICD-10-CM | POA: Diagnosis present

## 2019-01-12 DIAGNOSIS — G936 Cerebral edema: Secondary | ICD-10-CM | POA: Diagnosis present

## 2019-01-12 DIAGNOSIS — Z720 Tobacco use: Secondary | ICD-10-CM | POA: Diagnosis not present

## 2019-01-12 DIAGNOSIS — G9389 Other specified disorders of brain: Secondary | ICD-10-CM | POA: Diagnosis not present

## 2019-01-12 DIAGNOSIS — Z8249 Family history of ischemic heart disease and other diseases of the circulatory system: Secondary | ICD-10-CM

## 2019-01-12 DIAGNOSIS — Z419 Encounter for procedure for purposes other than remedying health state, unspecified: Secondary | ICD-10-CM

## 2019-01-12 DIAGNOSIS — F1290 Cannabis use, unspecified, uncomplicated: Secondary | ICD-10-CM | POA: Diagnosis present

## 2019-01-12 DIAGNOSIS — C7931 Secondary malignant neoplasm of brain: Secondary | ICD-10-CM | POA: Diagnosis present

## 2019-01-12 DIAGNOSIS — D72829 Elevated white blood cell count, unspecified: Secondary | ICD-10-CM | POA: Diagnosis not present

## 2019-01-12 DIAGNOSIS — F17210 Nicotine dependence, cigarettes, uncomplicated: Secondary | ICD-10-CM | POA: Diagnosis present

## 2019-01-12 DIAGNOSIS — K625 Hemorrhage of anus and rectum: Secondary | ICD-10-CM | POA: Diagnosis present

## 2019-01-12 LAB — CBC
HCT: 40.4 % (ref 39.0–52.0)
Hemoglobin: 13.1 g/dL (ref 13.0–17.0)
MCH: 28.2 pg (ref 26.0–34.0)
MCHC: 32.4 g/dL (ref 30.0–36.0)
MCV: 87.1 fL (ref 80.0–100.0)
Platelets: 532 10*3/uL — ABNORMAL HIGH (ref 150–400)
RBC: 4.64 MIL/uL (ref 4.22–5.81)
RDW: 13.2 % (ref 11.5–15.5)
WBC: 6.1 10*3/uL (ref 4.0–10.5)
nRBC: 0 % (ref 0.0–0.2)

## 2019-01-12 LAB — BASIC METABOLIC PANEL
Anion gap: 9 (ref 5–15)
BUN: 8 mg/dL (ref 6–20)
CO2: 24 mmol/L (ref 22–32)
Calcium: 8.9 mg/dL (ref 8.9–10.3)
Chloride: 105 mmol/L (ref 98–111)
Creatinine, Ser: 0.84 mg/dL (ref 0.61–1.24)
GFR calc Af Amer: >60 mL/min (ref >60)
GFR calc non Af Amer: >60 mL/min (ref >60)
Glucose, Bld: 91 mg/dL (ref 70–99)
Potassium: 4 mmol/L (ref 3.5–5.1)
Sodium: 138 mmol/L (ref 135–145)

## 2019-01-12 LAB — APTT: aPTT: 38 seconds — ABNORMAL HIGH (ref 24–36)

## 2019-01-12 LAB — MAGNESIUM: Magnesium: 2.5 mg/dL — ABNORMAL HIGH (ref 1.7–2.4)

## 2019-01-12 LAB — PROTIME-INR
INR: 1.3 — ABNORMAL HIGH (ref 0.8–1.2)
Prothrombin Time: 16.1 seconds — ABNORMAL HIGH (ref 11.4–15.2)

## 2019-01-12 LAB — PHOSPHORUS: Phosphorus: 3.1 mg/dL (ref 2.5–4.6)

## 2019-01-12 LAB — HIV ANTIBODY (ROUTINE TESTING W REFLEX): HIV Screen 4th Generation wRfx: NONREACTIVE

## 2019-01-12 LAB — SARS CORONAVIRUS 2 BY RT PCR (HOSPITAL ORDER, PERFORMED IN ~~LOC~~ HOSPITAL LAB): SARS Coronavirus 2: NEGATIVE

## 2019-01-12 MED ORDER — SENNA 8.6 MG PO TABS
1.0000 | ORAL_TABLET | Freq: Two times a day (BID) | ORAL | Status: DC
  Administered 2019-01-13 – 2019-01-20 (×14): 8.6 mg via ORAL
  Filled 2019-01-12 (×16): qty 1

## 2019-01-12 MED ORDER — MAGNESIUM SULFATE 2 GM/50ML IV SOLN
2.0000 g | Freq: Once | INTRAVENOUS | Status: AC
  Administered 2019-01-12: 2 g via INTRAVENOUS
  Filled 2019-01-12: qty 50

## 2019-01-12 MED ORDER — PROCHLORPERAZINE EDISYLATE 10 MG/2ML IJ SOLN
10.0000 mg | Freq: Once | INTRAMUSCULAR | Status: AC
  Administered 2019-01-12: 10 mg via INTRAVENOUS
  Filled 2019-01-12: qty 2

## 2019-01-12 MED ORDER — LEVETIRACETAM IN NACL 500 MG/100ML IV SOLN
500.0000 mg | Freq: Once | INTRAVENOUS | Status: AC
  Administered 2019-01-12: 500 mg via INTRAVENOUS
  Filled 2019-01-12: qty 100

## 2019-01-12 MED ORDER — FOLIC ACID 1 MG PO TABS
1.0000 mg | ORAL_TABLET | Freq: Every day | ORAL | Status: DC
  Administered 2019-01-12 – 2019-01-21 (×9): 1 mg via ORAL
  Filled 2019-01-12 (×9): qty 1

## 2019-01-12 MED ORDER — ADULT MULTIVITAMIN W/MINERALS CH
1.0000 | ORAL_TABLET | Freq: Every day | ORAL | Status: DC
  Administered 2019-01-12 – 2019-01-21 (×8): 1 via ORAL
  Filled 2019-01-12 (×9): qty 1

## 2019-01-12 MED ORDER — DIPHENHYDRAMINE HCL 50 MG/ML IJ SOLN
25.0000 mg | Freq: Once | INTRAMUSCULAR | Status: AC
  Administered 2019-01-12: 25 mg via INTRAVENOUS
  Filled 2019-01-12: qty 1

## 2019-01-12 MED ORDER — IOHEXOL 300 MG/ML  SOLN
100.0000 mL | Freq: Once | INTRAMUSCULAR | Status: AC | PRN
  Administered 2019-01-12: 100 mL via INTRAVENOUS

## 2019-01-12 MED ORDER — IOHEXOL 350 MG/ML SOLN
50.0000 mL | Freq: Once | INTRAVENOUS | Status: AC | PRN
  Administered 2019-01-12: 50 mL via INTRAVENOUS

## 2019-01-12 MED ORDER — SODIUM CHLORIDE 0.9% FLUSH
3.0000 mL | Freq: Two times a day (BID) | INTRAVENOUS | Status: DC
  Administered 2019-01-12 – 2019-01-21 (×16): 3 mL via INTRAVENOUS

## 2019-01-12 MED ORDER — IPRATROPIUM-ALBUTEROL 0.5-2.5 (3) MG/3ML IN SOLN: 3.0000 mL | RESPIRATORY_TRACT | Status: DC | PRN

## 2019-01-12 MED ORDER — ONDANSETRON HCL 4 MG/2ML IJ SOLN: 4.0000 mg | Freq: Four times a day (QID) | INTRAMUSCULAR | Status: DC | PRN

## 2019-01-12 MED ORDER — ONDANSETRON HCL 4 MG PO TABS: 4.0000 mg | ORAL_TABLET | Freq: Four times a day (QID) | ORAL | Status: DC | PRN

## 2019-01-12 MED ORDER — ACETAMINOPHEN 650 MG RE SUPP: 650.0000 mg | Freq: Four times a day (QID) | RECTAL | Status: DC | PRN

## 2019-01-12 MED ORDER — ACETAMINOPHEN 325 MG PO TABS
650.0000 mg | ORAL_TABLET | Freq: Four times a day (QID) | ORAL | Status: DC | PRN
  Administered 2019-01-13 – 2019-01-21 (×5): 650 mg via ORAL
  Filled 2019-01-12 (×5): qty 2

## 2019-01-12 MED ORDER — SODIUM CHLORIDE 0.9 % IV BOLUS
1000.0000 mL | Freq: Once | INTRAVENOUS | Status: AC
  Administered 2019-01-12: 1000 mL via INTRAVENOUS

## 2019-01-12 MED ORDER — VITAMIN B-1 100 MG PO TABS
100.0000 mg | ORAL_TABLET | Freq: Every day | ORAL | Status: DC
  Administered 2019-01-12 – 2019-01-21 (×9): 100 mg via ORAL
  Filled 2019-01-12 (×9): qty 1

## 2019-01-12 MED ORDER — DEXAMETHASONE SODIUM PHOSPHATE 4 MG/ML IJ SOLN
4.0000 mg | Freq: Four times a day (QID) | INTRAMUSCULAR | Status: DC
  Administered 2019-01-12 – 2019-01-20 (×31): 4 mg via INTRAVENOUS
  Filled 2019-01-12 (×31): qty 1

## 2019-01-12 MED ORDER — ENOXAPARIN SODIUM 40 MG/0.4ML ~~LOC~~ SOLN
40.0000 mg | SUBCUTANEOUS | Status: DC
  Administered 2019-01-12 – 2019-01-19 (×7): 40 mg via SUBCUTANEOUS
  Filled 2019-01-12 (×8): qty 0.4

## 2019-01-12 MED ORDER — POLYETHYLENE GLYCOL 3350 17 G PO PACK: 17.0000 g | PACK | Freq: Every day | ORAL | Status: DC | PRN

## 2019-01-12 MED ORDER — DEXAMETHASONE SODIUM PHOSPHATE 10 MG/ML IJ SOLN
10.0000 mg | Freq: Once | INTRAMUSCULAR | Status: AC
  Administered 2019-01-12: 10 mg via INTRAVENOUS
  Filled 2019-01-12: qty 1

## 2019-01-12 MED ORDER — OXYCODONE HCL 5 MG PO TABS
5.0000 mg | ORAL_TABLET | ORAL | Status: DC | PRN
  Administered 2019-01-16 – 2019-01-20 (×3): 5 mg via ORAL
  Filled 2019-01-12 (×3): qty 1

## 2019-01-12 MED ORDER — LEVETIRACETAM 500 MG PO TABS
500.0000 mg | ORAL_TABLET | Freq: Two times a day (BID) | ORAL | Status: DC
  Administered 2019-01-13 – 2019-01-20 (×14): 500 mg via ORAL
  Filled 2019-01-12 (×14): qty 1

## 2019-01-12 MED ORDER — DOCUSATE SODIUM 100 MG PO CAPS
100.0000 mg | ORAL_CAPSULE | Freq: Two times a day (BID) | ORAL | Status: DC
  Administered 2019-01-13 – 2019-01-20 (×14): 100 mg via ORAL
  Filled 2019-01-12 (×16): qty 1

## 2019-01-12 NOTE — ED Notes (Signed)
Food and Drink offered to patient.

## 2019-01-12 NOTE — ED Triage Notes (Signed)
Pt reports continued headache for the past few weeks, pt seen here 2 weeks ago for same but states nothing in helping. Pt a.o, nad noted.

## 2019-01-12 NOTE — ED Notes (Signed)
PT-INR/APTT Hemolyzed, reordered and will be redrawn.

## 2019-01-12 NOTE — ED Notes (Addendum)
Pharmacy notified to verify medications 

## 2019-01-12 NOTE — Progress Notes (Signed)
Pt arrived unit safely

## 2019-01-12 NOTE — H&P (Addendum)
History and Physical    [facility] HCW:[id number] DOB: 1961-02-08 DOA: 01/12/2019  PCP: Patient, No Pcp Per Patient coming from: Home  I have personally briefly reviewed patient's old medical records in [place]  Chief Complaint: Headache  HPI: [facility] is a 58 y.o. male with medical history significant for recently discovered left upper lobe mass, cocaine and alcohol abuse who presents from home with headache.  Left upper lobe mass initially discovered on CT chest in September.  No biopsy has been attempted to date.  He endorses headache, balance issues and gait instability, numbness and tingling in his hands and feet for the last few weeks.  Denies seizure activity, denies vision changes.  He also endorses chronic cough, night sweats and sporadic fevers and chills.  Denies hemoptysis.  Unsure of any recent weight loss.  He is an active every day smoker with greater than 40-pack-year smoking history.  He has no known history of tuberculosis or known tuberculosis exposure, although he has been homeless and has been in jail multiple times in the past.  Denies IV drug use, but does have history of cocaine use.  Denies recent significant alcohol intake.  ED Course: In the ED, patient afebrile, normotensive saturating comfortably on room air.  Labs largely unremarkable with the exception of platelet count of 532.  CT head and neck demonstrated 3 cm right cerebellar mass with surrounding edema and mass-effect.  Imaging also redemonstrated 5 cm cavitary left upper lobe mass with associated mild mediastinal lymphadenopathy.  Review of Systems: As per HPI otherwise 10 point review of systems negative.   Past Medical History:  Diagnosis Date   Septic arthritis [facility])     Past Surgical History:  Procedure Laterality Date   knee surgery       reports that he has been smoking cigarettes. He has been smoking about 1.50 packs per day. He has never used smokeless tobacco. He reports current  alcohol use. He reports current drug use. Frequency: 6.00 times per week. Drugs: Cocaine and Marijuana.  No Known Allergies  Family History  Problem Relation Age of Onset   Diabetes Mother    Hypertension Mother    Heart disease Mother    Cancer Father    Cancer Maternal Aunt     Prior to Admission medications   Medication Sig Start Date End Date Taking? Authorizing Provider  methocarbamol (ROBAXIN) 500 MG tablet Take 1 tablet (500 mg total) by mouth 2 (two) times daily. 01/05/19  Yes Delia Heady, PA-C    Physical Exam: Vitals:   01/12/19 1715 01/12/19 1800 01/12/19 1816 01/12/19 1845  BP: 123/82 117/75  126/80  Pulse: 72 74 75 75  Resp: 18 17    Temp:      TempSrc:      SpO2: 100% 98% 99% 93%  Weight:      Height:        Constitutional: NAD, calm, comfortable Eyes: PERRL, lids and conjunctivae normal ENMT: Mucous membranes are moist. Posterior pharynx clear of any exudate or lesions  Neck: normal, supple, no masses Respiratory: crackles in LUL, otherwise CTA. Normal respiratory effort. Cardiovascular: Regular rate and rhythm, no murmurs / rubs / gallops. No extremity edema. 2+ pedal pulses. Abdomen: no tenderness, no masses palpated.  Bowel sounds positive.  Musculoskeletal: + digital clubbing, no cyanosis. Skin: no rashes, lesions, ulcers. Neurologic: CN 2-12 grossly intact. Strength 5/5 in all 4.  Psychiatric: Normal judgment and insight. Alert and oriented x 3. Normal mood.  Labs on Admission: I have personally reviewed following labs and imaging studies  CBC: Recent Labs  Lab 01/12/19 1457  WBC 6.1  HGB 13.1  HCT 40.4  MCV 87.1  PLT 761*   Basic Metabolic Panel: Recent Labs  Lab 01/12/19 1457  NA 138  K 4.0  CL 105  CO2 24  GLUCOSE 91  BUN 8  CREATININE 0.84  CALCIUM 8.9   GFR: Estimated Creatinine Clearance: 95.3 mL/min (by C-G formula based on SCr of 0.84 mg/dL). Liver Function Tests: No results for input(s): AST, ALT, ALKPHOS,  BILITOT, PROT, ALBUMIN in the last 168 hours. No results for input(s): LIPASE, AMYLASE in the last 168 hours. No results for input(s): AMMONIA in the last 168 hours. Coagulation Profile: No results for input(s): INR, PROTIME in the last 168 hours. Cardiac Enzymes: No results for input(s): CKTOTAL, CKMB, CKMBINDEX, TROPONINI in the last 168 hours. BNP (last 3 results) No results for input(s): PROBNP in the last 8760 hours. HbA1C: No results for input(s): HGBA1C in the last 72 hours. CBG: No results for input(s): GLUCAP in the last 168 hours. Lipid Profile: No results for input(s): CHOL, HDL, LDLCALC, TRIG, CHOLHDL, LDLDIRECT in the last 72 hours. Thyroid Function Tests: No results for input(s): TSH, T4TOTAL, FREET4, T3FREE, THYROIDAB in the last 72 hours. Anemia Panel: No results for input(s): VITAMINB12, FOLATE, FERRITIN, TIBC, IRON, RETICCTPCT in the last 72 hours. Urine analysis:    Component Value Date/Time   COLORURINE YELLOW 05/10/2013 1651   APPEARANCEUR CLEAR 05/10/2013 1651   LABSPEC 1.021 05/10/2013 1651   PHURINE 6.5 05/10/2013 1651   GLUCOSEU NEGATIVE 05/10/2013 1651   HGBUR TRACE (A) 05/10/2013 1651   BILIRUBINUR NEGATIVE 05/10/2013 1651   KETONESUR NEGATIVE 05/10/2013 1651   PROTEINUR NEGATIVE 05/10/2013 1651   UROBILINOGEN 1.0 05/10/2013 1651   NITRITE NEGATIVE 05/10/2013 1651   LEUKOCYTESUR NEGATIVE 05/10/2013 1651    Radiological Exams on Admission: Ct Angio Head W/cm &/or Wo Cm  Result Date: 01/12/2019 CLINICAL DATA:  Rule out subarachnoid hemorrhage. Headache for several weeks. Lung mass. EXAM: CT ANGIOGRAPHY HEAD AND NECK TECHNIQUE: Multidetector CT imaging of the head and neck was performed using the standard protocol during bolus administration of intravenous contrast. Multiplanar CT image reconstructions and MIPs were obtained to evaluate the vascular anatomy. Carotid stenosis measurements (when applicable) are obtained utilizing NASCET criteria, using  the distal internal carotid diameter as the denominator. CONTRAST:  25mL OMNIPAQUE IOHEXOL 350 MG/ML SOLN COMPARISON:  CT head 01/05/2019 FINDINGS: CT HEAD FINDINGS Brain: Hypodensity right cerebellum with mild progression. This measures approximately 3 cm. There is progressive edema and mass-effect on the fourth ventricle. No hemorrhage. Ventricle size normal. No midline shift. Vascular: Negative for hyperdense vessel. Skull: Negative Sinuses: Negative Orbits: Negative Review of the MIP images confirms the above findings CTA NECK FINDINGS Aortic arch: Standard branching. Imaged portion shows no evidence of aneurysm or dissection. No significant stenosis of the major arch vessel origins. Right carotid system: Right carotid widely patent. Mild atherosclerotic disease right carotid bulb. Left carotid system: Left carotid widely patent. Mild atherosclerotic disease left carotid bulb. Vertebral arteries: Both vertebral arteries are patent to the basilar without stenosis or irregularity Skeleton: No acute spinal abnormality. Poor dentition with extensive caries and periapical lucencies due to dental infections. Other neck: Negative for mass or adenopathy Upper chest: 5 cm left upper lobe mass with mediastinal adenopathy consistent with carcinoma of the lung. The mass and adenopathy have progressed since chest CT 11/11/2018 Review of the  MIP images confirms the above findings CTA HEAD FINDINGS Anterior circulation: Cavernous carotid patent bilaterally without stenosis. Anterior and middle cerebral arteries patent bilaterally without stenosis or occlusion Posterior circulation: Both vertebral arteries patent to the basilar. PICA patent bilaterally. Basilar patent. Superior cerebellar and posterior cerebral arteries patent bilaterally without stenosis. Venous sinuses: Normal venous enhancement. Right cerebellar hypodensity does not show significant enhancement by CT however this is arterial phase scanning. No delayed  imaging performed of the head. Review of the MIP images confirms the above findings IMPRESSION: 3 cm hypodensity right cerebellum appears larger with increased mass effect compared to the recent head CT of 01/05/2019. Given the left upper lobe lung mass, this is most likely metastatic disease. Recommend MRI brain without with contrast. No significant intracranial or extracranial arterial stenosis 5 cm left upper lobe mass with adenopathy, this has progressed since chest CT of 11/11/2018 These results were called by telephone at the time of interpretation on 01/12/2019 at 6:07 pm to provider Nanda Quinton , who verbally acknowledged these results. Electronically Signed   By: Franchot Gallo M.D.   On: 01/12/2019 18:07   Ct Angio Neck W And/or Wo Contrast  Result Date: 01/12/2019 CLINICAL DATA:  Rule out subarachnoid hemorrhage. Headache for several weeks. Lung mass. EXAM: CT ANGIOGRAPHY HEAD AND NECK TECHNIQUE: Multidetector CT imaging of the head and neck was performed using the standard protocol during bolus administration of intravenous contrast. Multiplanar CT image reconstructions and MIPs were obtained to evaluate the vascular anatomy. Carotid stenosis measurements (when applicable) are obtained utilizing NASCET criteria, using the distal internal carotid diameter as the denominator. CONTRAST:  26mL OMNIPAQUE IOHEXOL 350 MG/ML SOLN COMPARISON:  CT head 01/05/2019 FINDINGS: CT HEAD FINDINGS Brain: Hypodensity right cerebellum with mild progression. This measures approximately 3 cm. There is progressive edema and mass-effect on the fourth ventricle. No hemorrhage. Ventricle size normal. No midline shift. Vascular: Negative for hyperdense vessel. Skull: Negative Sinuses: Negative Orbits: Negative Review of the MIP images confirms the above findings CTA NECK FINDINGS Aortic arch: Standard branching. Imaged portion shows no evidence of aneurysm or dissection. No significant stenosis of the major arch vessel  origins. Right carotid system: Right carotid widely patent. Mild atherosclerotic disease right carotid bulb. Left carotid system: Left carotid widely patent. Mild atherosclerotic disease left carotid bulb. Vertebral arteries: Both vertebral arteries are patent to the basilar without stenosis or irregularity Skeleton: No acute spinal abnormality. Poor dentition with extensive caries and periapical lucencies due to dental infections. Other neck: Negative for mass or adenopathy Upper chest: 5 cm left upper lobe mass with mediastinal adenopathy consistent with carcinoma of the lung. The mass and adenopathy have progressed since chest CT 11/11/2018 Review of the MIP images confirms the above findings CTA HEAD FINDINGS Anterior circulation: Cavernous carotid patent bilaterally without stenosis. Anterior and middle cerebral arteries patent bilaterally without stenosis or occlusion Posterior circulation: Both vertebral arteries patent to the basilar. PICA patent bilaterally. Basilar patent. Superior cerebellar and posterior cerebral arteries patent bilaterally without stenosis. Venous sinuses: Normal venous enhancement. Right cerebellar hypodensity does not show significant enhancement by CT however this is arterial phase scanning. No delayed imaging performed of the head. Review of the MIP images confirms the above findings IMPRESSION: 3 cm hypodensity right cerebellum appears larger with increased mass effect compared to the recent head CT of 01/05/2019. Given the left upper lobe lung mass, this is most likely metastatic disease. Recommend MRI brain without with contrast. No significant intracranial or extracranial arterial stenosis 5  cm left upper lobe mass with adenopathy, this has progressed since chest CT of 11/11/2018 These results were called by telephone at the time of interpretation on 01/12/2019 at 6:07 pm to provider Nanda Quinton , who verbally acknowledged these results. Electronically Signed   By: Franchot Gallo M.D.   On: 01/12/2019 18:07    Assessment/Plan Active Problems:   Cerebellar mass  Mr. Giambalvo presents with headache found to have 3 cm right cerebellar mass with associated edema and mass-effect in the setting of known 5 cm left upper lobe mass.  The cerebellar mass is likely to reflect metastatic focus of primary lung cancer, although infection such as tuberculosis or fungal infection (histo, blasto, crypto) should also be considered.  On CT chest there is somewhat enlarged 4L node.  A branch of the posterior segment of the left upper lobe appears to approach the mass and may be amenable to navigational bronchoscopy.  Cavitary LUL mass -Consult pulmonology in a.m. for consideration for EBUS + navigational bronchoscopy for tissue diagnosis -Complete staging with MRI brain and CT abdomen pelvis -Would also send for bacterial, fungal, AFB cultures -Check HIV -Send IGRA -Duo-Nebs PRN for SOB -N.p.o. at midnight in anticipation of procedure -Covid testing negative  Cerebellar mass with edema and mass effect -Neurosurgery consulted, appreciate input -Start Decadron 4 mg every 6 hours, Keppra 500 mg twice daily -Tissue diagnosis as above  DVT prophylaxis: Lovenox Code Status: Full Disposition Plan: Home in 2-3 days Consults called: Neurosurgery (Ferne Reus, PA-C) Admission status: Inpatient   Kasie Leccese Sharene Butters MD [facility]  If 7PM-7AM, please contact night-coverage [email or web address] Password [facility]  01/12/2019, 7:23 PM

## 2019-01-12 NOTE — Consult Note (Signed)
Chief Complaint   Chief Complaint  Patient presents with   Headache    HPI   Consult requested by: Dr Laverta Baltimore, EDP [facility] Reason for consult: Brain mass  HPI: Stanford Strauch is a 58 y.o. male with no known significant past medical history who presented to the ED with several week history of headaches. Headaches started 3-4 weeks ago. Has never had headaches before. Fluctuate in intensity and location. No associated changes in vision, N/V, aura. He underwent CTA head which revealed a 3cm right cerebellum mass. NSY consultation requested. Interestingly, patient did present 1 week ago on 11/17 for evaluation of these headaches. CT head read as normal.  In addition to headaches, patient has also noticed balance issues over the last week. Describes feeling off balanced when walking, fortunately has not fallen. He has also noticed difficulties with coordination and fine motor skills over the last 4 weeks as well.    Patient was admitted at the end of September for peritonsillar abscess (I&D during hospitalization). There was also a left upper lobe mass seen, concerning for carcinoma. This was discussed with IR and pulmonology for possible biopsy, however there was concern that this represented underlying infection and repeat imaging was rec in 3-4 weeks. Patient never followed back up regarding the lung mass.  He does endorse a significant smoking history. >45 pack year. Now smokes 5-6  cigarettes per week.  Has not noticed any weight loss.   Patient Active Problem List   Diagnosis Date Noted   Tobacco use 11/19/2018   Cocaine use 11/19/2018   Peritonsillar abscess 11/10/2018   Mass of upper lobe of left lung 11/10/2018    PMH: Past Medical History:  Diagnosis Date   Septic arthritis ([place])     PSH: Past Surgical History:  Procedure Laterality Date   knee surgery      (Not in a hospital admission)   SH: Social History   Tobacco Use   Smoking status: Current Every Day  Smoker    Packs/day: 1.50    Types: Cigarettes   Smokeless tobacco: Never Used  Substance Use Topics   Alcohol use: Yes    Comment: 1-2 drinks per week   Drug use: Yes    Frequency: 6.0 times per week    Types: Cocaine, Marijuana    MEDS: Prior to Admission medications   Medication Sig Start Date End Date Taking? Authorizing Provider  methocarbamol (ROBAXIN) 500 MG tablet Take 1 tablet (500 mg total) by mouth 2 (two) times daily. 01/05/19   Khatri, Hina, PA-C    ALLERGY: No Known Allergies  Social History   Tobacco Use   Smoking status: Current Every Day Smoker    Packs/day: 1.50    Types: Cigarettes   Smokeless tobacco: Never Used  Substance Use Topics   Alcohol use: Yes    Comment: 1-2 drinks per week     Family History  Problem Relation Age of Onset   Diabetes Mother    Hypertension Mother    Heart disease Mother    Cancer Father    Cancer Maternal Aunt      ROS   Review of Systems  Constitutional: Negative.  Negative for fever and weight loss.  HENT: Negative.   Eyes: Positive for photophobia. Negative for blurred vision and double vision.  Respiratory: Negative.   Cardiovascular: Negative.   Gastrointestinal: Negative for nausea and vomiting.  Genitourinary: Negative.   Musculoskeletal: Negative.   Skin: Negative.   Neurological: Positive for headaches. Negative  for dizziness, tingling, tremors, sensory change, speech change, focal weakness, seizures, loss of consciousness and weakness.    Exam   Vitals:   01/12/19 1800 01/12/19 1816  BP: 117/75   Pulse: 74 75  Resp: 17   Temp:    SpO2: 98% 99%   General appearance: WDWN, NAD Eyes: No scleral injection Cardiovascular: Regular rate and rhythm without murmurs, rubs, gallops. No edema or variciosities. Distal pulses normal. Pulmonary: Effort normal, non-labored breathing Musculoskeletal:     Muscle tone upper extremities: Normal    Muscle tone lower extremities: Normal    Motor  exam: Upper Extremities Deltoid Bicep Tricep Grip  Right 5/5 5/5 5/5 5/5  Left 5/5 5/5 5/5 5/5   Lower Extremity IP Quad PF DF EHL  Right 5/5 5/5 5/5 5/5 5/5  Left 5/5 5/5 5/5 5/5 5/5   Neurological Mental Status:    - Patient is awake, alert, oriented to person, place, month, year, and situation    - Patient is able to give a clear and coherent history.    - No signs of aphasia or neglect Cranial Nerves    - II: Visual Fields are full. PERRL    - III/IV/VI: EOMI without ptosis or diploplia.     - V: Facial sensation is grossly normal    - VII: Facial movement is symmetric.     - VIII: hearing is intact to voice    - X: Uvula elevates symmetrically    - XI: Shoulder shrug is symmetric.    - XII: tongue is midline without atrophy or fasciculations.  Sensory: Sensation grossly intact to LT Plantars   - Toes are downgoing bilaterally.  Cerebellar    - FNF and HKS are intact although requires concentration No drift  Results - Imaging/Labs   Results for orders placed or performed during the hospital encounter of 01/12/19 (from the past 48 hour(s))  Basic metabolic panel     Status: None   Collection Time: 01/12/19  2:57 PM  Result Value Ref Range   Sodium 138 135 - 145 mmol/L   Potassium 4.0 3.5 - 5.1 mmol/L   Chloride 105 98 - 111 mmol/L   CO2 24 22 - 32 mmol/L   Glucose, Bld 91 70 - 99 mg/dL   BUN 8 6 - 20 mg/dL   Creatinine, Ser 0.84 0.61 - 1.24 mg/dL   Calcium 8.9 8.9 - 10.3 mg/dL   GFR calc non Af Amer >60 >60 mL/min   GFR calc Af Amer >60 >60 mL/min   Anion gap 9 5 - 15    Comment: Performed at [facility], [place] [address] [place], [place] [address]  CBC     Status: Abnormal   Collection Time: 01/12/19  2:57 PM  Result Value Ref Range   WBC 6.1 4.0 - 10.5 K/uL   RBC 4.64 4.22 - 5.81 MIL/uL   Hemoglobin 13.1 13.0 - 17.0 g/dL   HCT 40.4 39.0 - 52.0 %   MCV 87.1 80.0 - 100.0 fL   MCH 28.2 26.0 - 34.0 pg   MCHC 32.4 30.0 - 36.0 g/dL   RDW 13.2 11.5 - 15.5  %   Platelets 532 (H) 150 - 400 K/uL   nRBC 0.0 0.0 - 0.2 %    Comment: Performed at [facility] [address] [place], [place] [address]    Ct Angio Head W/cm &/or Wo Cm  Result Date: 01/12/2019 CLINICAL DATA:  Rule out subarachnoid hemorrhage.  Headache for several weeks. Lung mass. EXAM: CT ANGIOGRAPHY HEAD AND NECK TECHNIQUE: Multidetector CT imaging of the head and neck was performed using the standard protocol during bolus administration of intravenous contrast. Multiplanar CT image reconstructions and MIPs were obtained to evaluate the vascular anatomy. Carotid stenosis measurements (when applicable) are obtained utilizing NASCET criteria, using the distal internal carotid diameter as the denominator. CONTRAST:  10m OMNIPAQUE IOHEXOL 350 MG/ML SOLN COMPARISON:  CT head 01/05/2019 FINDINGS: CT HEAD FINDINGS Brain: Hypodensity right cerebellum with mild progression. This measures approximately 3 cm. There is progressive edema and mass-effect on the fourth ventricle. No hemorrhage. Ventricle size normal. No midline shift. Vascular: Negative for hyperdense vessel. Skull: Negative Sinuses: Negative Orbits: Negative Review of the MIP images confirms the above findings CTA NECK FINDINGS Aortic arch: Standard branching. Imaged portion shows no evidence of aneurysm or dissection. No significant stenosis of the major arch vessel origins. Right carotid system: Right carotid widely patent. Mild atherosclerotic disease right carotid bulb. Left carotid system: Left carotid widely patent. Mild atherosclerotic disease left carotid bulb. Vertebral arteries: Both vertebral arteries are patent to the basilar without stenosis or irregularity Skeleton: No acute spinal abnormality. Poor dentition with extensive caries and periapical lucencies due to dental infections. Other neck: Negative for mass or adenopathy Upper chest: 5 cm left upper lobe mass with mediastinal adenopathy consistent with carcinoma of the  lung. The mass and adenopathy have progressed since chest CT 11/11/2018 Review of the MIP images confirms the above findings CTA HEAD FINDINGS Anterior circulation: Cavernous carotid patent bilaterally without stenosis. Anterior and middle cerebral arteries patent bilaterally without stenosis or occlusion Posterior circulation: Both vertebral arteries patent to the basilar. PICA patent bilaterally. Basilar patent. Superior cerebellar and posterior cerebral arteries patent bilaterally without stenosis. Venous sinuses: Normal venous enhancement. Right cerebellar hypodensity does not show significant enhancement by CT however this is arterial phase scanning. No delayed imaging performed of the head. Review of the MIP images confirms the above findings IMPRESSION: 3 cm hypodensity right cerebellum appears larger with increased mass effect compared to the recent head CT of 01/05/2019. Given the left upper lobe lung mass, this is most likely metastatic disease. Recommend MRI brain without with contrast. No significant intracranial or extracranial arterial stenosis 5 cm left upper lobe mass with adenopathy, this has progressed since chest CT of 11/11/2018 These results were called by telephone at the time of interpretation on 01/12/2019 at 6:07 pm to provider JNanda Quinton, who verbally acknowledged these results. Electronically Signed   By: CFranchot GalloM.D.   On: 01/12/2019 18:07   Ct Angio Neck W And/or Wo Contrast  Result Date: 01/12/2019 CLINICAL DATA:  Rule out subarachnoid hemorrhage. Headache for several weeks. Lung mass. EXAM: CT ANGIOGRAPHY HEAD AND NECK TECHNIQUE: Multidetector CT imaging of the head and neck was performed using the standard protocol during bolus administration of intravenous contrast. Multiplanar CT image reconstructions and MIPs were obtained to evaluate the vascular anatomy. Carotid stenosis measurements (when applicable) are obtained utilizing NASCET criteria, using the distal internal  carotid diameter as the denominator. CONTRAST:  568mOMNIPAQUE IOHEXOL 350 MG/ML SOLN COMPARISON:  CT head 01/05/2019 FINDINGS: CT HEAD FINDINGS Brain: Hypodensity right cerebellum with mild progression. This measures approximately 3 cm. There is progressive edema and mass-effect on the fourth ventricle. No hemorrhage. Ventricle size normal. No midline shift. Vascular: Negative for hyperdense vessel. Skull: Negative Sinuses: Negative Orbits: Negative Review of the MIP images confirms the above findings CTA NECK FINDINGS Aortic  arch: Standard branching. Imaged portion shows no evidence of aneurysm or dissection. No significant stenosis of the major arch vessel origins. Right carotid system: Right carotid widely patent. Mild atherosclerotic disease right carotid bulb. Left carotid system: Left carotid widely patent. Mild atherosclerotic disease left carotid bulb. Vertebral arteries: Both vertebral arteries are patent to the basilar without stenosis or irregularity Skeleton: No acute spinal abnormality. Poor dentition with extensive caries and periapical lucencies due to dental infections. Other neck: Negative for mass or adenopathy Upper chest: 5 cm left upper lobe mass with mediastinal adenopathy consistent with carcinoma of the lung. The mass and adenopathy have progressed since chest CT 11/11/2018 Review of the MIP images confirms the above findings CTA HEAD FINDINGS Anterior circulation: Cavernous carotid patent bilaterally without stenosis. Anterior and middle cerebral arteries patent bilaterally without stenosis or occlusion Posterior circulation: Both vertebral arteries patent to the basilar. PICA patent bilaterally. Basilar patent. Superior cerebellar and posterior cerebral arteries patent bilaterally without stenosis. Venous sinuses: Normal venous enhancement. Right cerebellar hypodensity does not show significant enhancement by CT however this is arterial phase scanning. No delayed imaging performed of the  head. Review of the MIP images confirms the above findings IMPRESSION: 3 cm hypodensity right cerebellum appears larger with increased mass effect compared to the recent head CT of 01/05/2019. Given the left upper lobe lung mass, this is most likely metastatic disease. Recommend MRI brain without with contrast. No significant intracranial or extracranial arterial stenosis 5 cm left upper lobe mass with adenopathy, this has progressed since chest CT of 11/11/2018 These results were called by telephone at the time of interpretation on 01/12/2019 at 6:07 pm to provider Nanda Quinton , who verbally acknowledged these results. Electronically Signed   By: Franchot Gallo M.D.   On: 01/12/2019 18:07   Impression/Plan   58 y.o. male with new cerebellar mass noted on CT with known left lung mass. She is grossly neurologically intact. This will need to be worked up further. Patient to be admitted under [facility] for workup and management.  Cerebellar mass with vasogenic edema  - concerning for met from lung - Start decadron. Already received 56m. Start 427mq 6 hours - Keppra for seizure prophylaxis - MRI brain w/wo contrast with stereotactic/3T protocol - Will need to consult neuro-onc after MRI  Lung mass - Will need to be biopsied  Dr StVertell Limbero f/u tomorrow after MRI has been completed. Please call for any concerns.  ViFerne ReusPA-C Ca[facility] SpBJ[facility]

## 2019-01-12 NOTE — ED Notes (Signed)
Patient hard stick today nurse, two nurse tech's all attempted 2 times for a total of 6 sticks. Notified phlebotomy   who will attempt.

## 2019-01-12 NOTE — ED Provider Notes (Signed)
[facility] EMERGENCY DEPARTMENT Provider Note   CSN: [id number] Arrival date & time: 01/12/19  1057     History   Chief Complaint Chief Complaint  Patient presents with  . Headache    HPI Micheal Vincent is a 58 y.o. male.ale.     58yo M w/ PMH including cocaine abuse, lung mass who p/w headache. Pt presented here on 11/18 reporting 4 days of persistent headache that was gradual in onset but persistent and migratory.  He received migraine cocktail and head CT was negative.  He was discharged home with only slight improvement in his symptoms and he reports that the headache eventually worsened again. He has been taking prescribed muscle relaxant with no improvement. Headache has been constant, worse with loud noise and movement.  No photophobia.  No vision changes, vomiting, or focal weakness.  He has been taking ibuprofen several times daily with no relief.  He does admit to cocaine use, last use was approximately 2 weeks ago.  He denies any fevers or recent illness.   The history is provided by the patient.  Headache   Past Medical History:  Diagnosis Date  . Septic arthritis [facility])     Patient Active Problem List   Diagnosis Date Noted  . Tobacco use 11/19/2018  . Cocaine use 11/19/2018  . Peritonsillar abscess 11/10/2018  . Mass of upper lobe of left lung 11/10/2018    Past Surgical History:  Procedure Laterality Date  . knee surgery          Home Medications    Prior to Admission medications   Medication Sig Start Date End Date Taking? Authorizing Provider  methocarbamol (ROBAXIN) 500 MG tablet Take 1 tablet (500 mg total) by mouth 2 (two) times daily. 01/05/19   Delia Heady, PA-C    Family History Family History  Problem Relation Age of Onset  . Diabetes Mother   . Hypertension Mother   . Heart disease Mother   . Cancer Father   . Cancer Maternal Aunt     Social History Social History   Tobacco Use  . Smoking status: Current Every  Day Smoker    Packs/day: 1.50    Types: Cigarettes  . Smokeless tobacco: Never Used  Substance Use Topics  . Alcohol use: Yes    Comment: 1-2 drinks per week  . Drug use: Yes    Frequency: 6.0 times per week    Types: Cocaine, Marijuana     Allergies   Patient has no known allergies.   Review of Systems Review of Systems  Neurological: Positive for headaches.   All other systems reviewed and are negative except that which was mentioned in HPI   Physical Exam Updated Vital Signs BP 133/88   Pulse 70   Temp 98 F (36.7 C) (Oral)   Resp 16   Ht 6' (1.829 m)   Wt 70.3 kg   SpO2 100%   BMI 21.02 kg/m   Physical Exam Vitals signs and nursing note reviewed.  Constitutional:      General: He is not in acute distress.    Appearance: He is well-developed.     Comments: Awake, alert  HENT:     Head: Normocephalic and atraumatic.     Mouth/Throat:     Mouth: Mucous membranes are moist.     Pharynx: Oropharynx is clear.  Eyes:     Extraocular Movements: Extraocular movements intact.     Conjunctiva/sclera: Conjunctivae normal.     Pupils:  Pupils are equal, round, and reactive to light.  Neck:     Musculoskeletal: Neck supple.  Cardiovascular:     Rate and Rhythm: Normal rate.  Pulmonary:     Effort: Pulmonary effort is normal.  Musculoskeletal:        General: No swelling or tenderness.  Skin:    General: Skin is warm and dry.  Neurological:     Mental Status: He is alert and oriented to person, place, and time.     Cranial Nerves: No cranial nerve deficit.     Motor: No abnormal muscle tone.     Deep Tendon Reflexes: Reflexes are normal and symmetric.     Comments: Fluent speech, normal finger-to-nose testing, negative pronator drift, no clonus 5/5 strength and normal sensation x all 4 extremities  Psychiatric:        Thought Content: Thought content normal.        Judgment: Judgment normal.      ED Treatments / Results  Labs (all labs ordered are  listed, but only abnormal results are displayed) Labs Reviewed  CBC - Abnormal; Notable for the following components:      Result Value   Platelets 532 (*)    All other components within normal limits  BASIC METABOLIC PANEL    EKG None  Radiology No results found.  Procedures Procedures (including critical care time)  Medications Ordered in ED Medications  sodium chloride 0.9 % bolus 1,000 mL (0 mLs Intravenous Stopped 01/12/19 1450)  diphenhydrAMINE (BENADRYL) injection 25 mg (25 mg Intravenous Given 01/12/19 1313)  prochlorperazine (COMPAZINE) injection 10 mg (10 mg Intravenous Given 01/12/19 1317)  magnesium sulfate IVPB 2 g 50 mL (0 g Intravenous Stopped 01/12/19 1430)  dexamethasone (DECADRON) injection 10 mg (10 mg Intravenous Given 01/12/19 1315)     Initial Impression / Assessment and Plan / ED Course  I have reviewed the triage vital signs and the nursing notes.  Pertinent labs & imaging results that were available during my care of the patient were reviewed by me and considered in my medical decision making (see chart for details).       Neurologically intact, normal vital signs, no meningismus or infectious symptoms to suggest meningitis.  I reviewed recent noncontrasted head CT which was unremarkable.  Because of his reported history of chronic cocaine abuse, obtain CTA of head and neck to evaluate for vascular pathology.  Gave the patient above migraine cocktail.  Basic labs unremarkable.  CTA is pending and I am signing patient out to oncoming provider pending imaging results and clinical improvement.  Final Clinical Impressions(s) / ED Diagnoses   Final diagnoses:  None    ED Discharge Orders    None       Little, Wenda Overland, MD 01/12/19 1540

## 2019-01-12 NOTE — ED Provider Notes (Signed)
Blood pressure 122/80, pulse 74, temperature 98 F (36.7 C), temperature source Oral, resp. rate 18, height 6' (1.829 m), weight 70.3 kg, SpO2 99 %.  Assuming care from Dr. Rex Kras.  In short, Micheal Vincent is a 58 y.o. male with a chief complaint of Headache .  Refer to the original H&P for additional details.  The current plan of care is to f/u on CTA and reassess.   06:15 PM  Spoke with Radiology regarding the CT findings showing a likely metastatic lesion in the cerebellum with surrounding vasogenic edema.  Patient with lung mass which is likely a primary cancer.  In talking with the patient he has not had follow-up regarding this.  He does note feeling "dizzy" but relatively unremarkable neurologic exam for me.  I am paging neurosurgery and will send the patient for MRI of the brain with and without contrast for further evaluation.   06:55 PM  NSG to see patient. Added Decadron. Will admit for obs. NPO after midnight.   Discussed patient's case with [facility], Dr. Jonnie Finner to request admission. Patient and family (if present) updated with plan. Care transferred to [facility] service.  I reviewed all nursing notes, vitals, pertinent old records, EKGs, labs, imaging (as available).     Margette Fast, MD 01/12/19 Kathyrn Drown

## 2019-01-13 ENCOUNTER — Inpatient Hospital Stay (HOSPITAL_COMMUNITY): Payer: Medicaid Other

## 2019-01-13 ENCOUNTER — Encounter (HOSPITAL_COMMUNITY): Payer: Self-pay | Admitting: *Deleted

## 2019-01-13 DIAGNOSIS — G9389 Other specified disorders of brain: Secondary | ICD-10-CM

## 2019-01-13 DIAGNOSIS — J984 Other disorders of lung: Secondary | ICD-10-CM

## 2019-01-13 LAB — BASIC METABOLIC PANEL
Anion gap: 11 (ref 5–15)
BUN: 12 mg/dL (ref 6–20)
CO2: 25 mmol/L (ref 22–32)
Calcium: 9 mg/dL (ref 8.9–10.3)
Chloride: 99 mmol/L (ref 98–111)
Creatinine, Ser: 0.84 mg/dL (ref 0.61–1.24)
GFR calc Af Amer: >60 mL/min (ref >60)
GFR calc non Af Amer: >60 mL/min (ref >60)
Glucose, Bld: 136 mg/dL — ABNORMAL HIGH (ref 70–99)
Potassium: 4.5 mmol/L (ref 3.5–5.1)
Sodium: 135 mmol/L (ref 135–145)

## 2019-01-13 LAB — CBC
HCT: 37 % — ABNORMAL LOW (ref 39.0–52.0)
Hemoglobin: 12.3 g/dL — ABNORMAL LOW (ref 13.0–17.0)
MCH: 28.1 pg (ref 26.0–34.0)
MCHC: 33.2 g/dL (ref 30.0–36.0)
MCV: 84.7 fL (ref 80.0–100.0)
Platelets: 537 10*3/uL — ABNORMAL HIGH (ref 150–400)
RBC: 4.37 MIL/uL (ref 4.22–5.81)
RDW: 12.9 % (ref 11.5–15.5)
WBC: 7 10*3/uL (ref 4.0–10.5)
nRBC: 0 % (ref 0.0–0.2)

## 2019-01-13 LAB — GLUCOSE, CAPILLARY: Glucose-Capillary: 138 mg/dL — ABNORMAL HIGH (ref 70–99)

## 2019-01-13 MED ORDER — GADOBUTROL 1 MMOL/ML IV SOLN
7.5000 mL | Freq: Once | INTRAVENOUS | Status: AC | PRN
  Administered 2019-01-13: 7.5 mL via INTRAVENOUS

## 2019-01-13 NOTE — Progress Notes (Addendum)
Subjective: Patient reports "I feel okay, my head just hurts"  Objective: Vital signs in last 24 hours: Temp:  [97.7 F (36.5 C)-98.6 F (37 C)] 97.7 F (36.5 C) (11/25 0428) Pulse Rate:  [70-86] 72 (11/25 0428) Resp:  [16-18] 16 (11/25 0428) BP: (117-144)/(75-88) 144/78 (11/25 0428) SpO2:  [93 %-100 %] 99 % (11/25 0428) Weight:  [70.3 kg-72 kg] 72 kg (11/24 2056)  Intake/Output from previous day: 11/24 0701 - 11/25 0700 In: -  Out: 1600 [Urine:1600] Intake/Output this shift: No intake/output data recorded.  Awake, alert, conversant.  Reports mild persistent headache.   Moving all extremities well.  No drift.  PEARL.  Tongue protrudes midline.  Lab Results: Recent Labs    01/12/19 1457 01/13/19 0245  WBC 6.1 7.0  HGB 13.1 12.3*  HCT 40.4 37.0*  PLT 532* 537*   BMET Recent Labs    01/12/19 1457 01/13/19 0245  NA 138 135  K 4.0 4.5  CL 105 99  CO2 24 25  GLUCOSE 91 136*  BUN 8 12  CREATININE 0.84 0.84  CALCIUM 8.9 9.0    Studies/Results: Ct Angio Head W/cm &/or Wo Cm  Result Date: 01/12/2019 CLINICAL DATA:  Rule out subarachnoid hemorrhage. Headache for several weeks. Lung mass. EXAM: CT ANGIOGRAPHY HEAD AND NECK TECHNIQUE: Multidetector CT imaging of the head and neck was performed using the standard protocol during bolus administration of intravenous contrast. Multiplanar CT image reconstructions and MIPs were obtained to evaluate the vascular anatomy. Carotid stenosis measurements (when applicable) are obtained utilizing NASCET criteria, using the distal internal carotid diameter as the denominator. CONTRAST:  52mL OMNIPAQUE IOHEXOL 350 MG/ML SOLN COMPARISON:  CT head 01/05/2019 FINDINGS: CT HEAD FINDINGS Brain: Hypodensity right cerebellum with mild progression. This measures approximately 3 cm. There is progressive edema and mass-effect on the fourth ventricle. No hemorrhage. Ventricle size normal. No midline shift. Vascular: Negative for hyperdense vessel.  Skull: Negative Sinuses: Negative Orbits: Negative Review of the MIP images confirms the above findings CTA NECK FINDINGS Aortic arch: Standard branching. Imaged portion shows no evidence of aneurysm or dissection. No significant stenosis of the major arch vessel origins. Right carotid system: Right carotid widely patent. Mild atherosclerotic disease right carotid bulb. Left carotid system: Left carotid widely patent. Mild atherosclerotic disease left carotid bulb. Vertebral arteries: Both vertebral arteries are patent to the basilar without stenosis or irregularity Skeleton: No acute spinal abnormality. Poor dentition with extensive caries and periapical lucencies due to dental infections. Other neck: Negative for mass or adenopathy Upper chest: 5 cm left upper lobe mass with mediastinal adenopathy consistent with carcinoma of the lung. The mass and adenopathy have progressed since chest CT 11/11/2018 Review of the MIP images confirms the above findings CTA HEAD FINDINGS Anterior circulation: Cavernous carotid patent bilaterally without stenosis. Anterior and middle cerebral arteries patent bilaterally without stenosis or occlusion Posterior circulation: Both vertebral arteries patent to the basilar. PICA patent bilaterally. Basilar patent. Superior cerebellar and posterior cerebral arteries patent bilaterally without stenosis. Venous sinuses: Normal venous enhancement. Right cerebellar hypodensity does not show significant enhancement by CT however this is arterial phase scanning. No delayed imaging performed of the head. Review of the MIP images confirms the above findings IMPRESSION: 3 cm hypodensity right cerebellum appears larger with increased mass effect compared to the recent head CT of 01/05/2019. Given the left upper lobe lung mass, this is most likely metastatic disease. Recommend MRI brain without with contrast. No significant intracranial or extracranial arterial stenosis 5 cm left  upper lobe mass with  adenopathy, this has progressed since chest CT of 11/11/2018 These results were called by telephone at the time of interpretation on 01/12/2019 at 6:07 pm to provider Nanda Quinton , who verbally acknowledged these results. Electronically Signed   By: Franchot Gallo M.D.   On: 01/12/2019 18:07   Ct Angio Neck W And/or Wo Contrast  Result Date: 01/12/2019 CLINICAL DATA:  Rule out subarachnoid hemorrhage. Headache for several weeks. Lung mass. EXAM: CT ANGIOGRAPHY HEAD AND NECK TECHNIQUE: Multidetector CT imaging of the head and neck was performed using the standard protocol during bolus administration of intravenous contrast. Multiplanar CT image reconstructions and MIPs were obtained to evaluate the vascular anatomy. Carotid stenosis measurements (when applicable) are obtained utilizing NASCET criteria, using the distal internal carotid diameter as the denominator. CONTRAST:  48mL OMNIPAQUE IOHEXOL 350 MG/ML SOLN COMPARISON:  CT head 01/05/2019 FINDINGS: CT HEAD FINDINGS Brain: Hypodensity right cerebellum with mild progression. This measures approximately 3 cm. There is progressive edema and mass-effect on the fourth ventricle. No hemorrhage. Ventricle size normal. No midline shift. Vascular: Negative for hyperdense vessel. Skull: Negative Sinuses: Negative Orbits: Negative Review of the MIP images confirms the above findings CTA NECK FINDINGS Aortic arch: Standard branching. Imaged portion shows no evidence of aneurysm or dissection. No significant stenosis of the major arch vessel origins. Right carotid system: Right carotid widely patent. Mild atherosclerotic disease right carotid bulb. Left carotid system: Left carotid widely patent. Mild atherosclerotic disease left carotid bulb. Vertebral arteries: Both vertebral arteries are patent to the basilar without stenosis or irregularity Skeleton: No acute spinal abnormality. Poor dentition with extensive caries and periapical lucencies due to dental infections.  Other neck: Negative for mass or adenopathy Upper chest: 5 cm left upper lobe mass with mediastinal adenopathy consistent with carcinoma of the lung. The mass and adenopathy have progressed since chest CT 11/11/2018 Review of the MIP images confirms the above findings CTA HEAD FINDINGS Anterior circulation: Cavernous carotid patent bilaterally without stenosis. Anterior and middle cerebral arteries patent bilaterally without stenosis or occlusion Posterior circulation: Both vertebral arteries patent to the basilar. PICA patent bilaterally. Basilar patent. Superior cerebellar and posterior cerebral arteries patent bilaterally without stenosis. Venous sinuses: Normal venous enhancement. Right cerebellar hypodensity does not show significant enhancement by CT however this is arterial phase scanning. No delayed imaging performed of the head. Review of the MIP images confirms the above findings IMPRESSION: 3 cm hypodensity right cerebellum appears larger with increased mass effect compared to the recent head CT of 01/05/2019. Given the left upper lobe lung mass, this is most likely metastatic disease. Recommend MRI brain without with contrast. No significant intracranial or extracranial arterial stenosis 5 cm left upper lobe mass with adenopathy, this has progressed since chest CT of 11/11/2018 These results were called by telephone at the time of interpretation on 01/12/2019 at 6:07 pm to provider Nanda Quinton , who verbally acknowledged these results. Electronically Signed   By: Franchot Gallo M.D.   On: 01/12/2019 18:07   Ct Chest W Contrast  Result Date: 01/12/2019 CLINICAL DATA:  Known left upper lobe lung mass EXAM: CT CHEST, ABDOMEN, AND PELVIS WITH CONTRAST TECHNIQUE: Multidetector CT imaging of the chest, abdomen and pelvis was performed following the standard protocol during bolus administration of intravenous contrast. CONTRAST:  117mL OMNIPAQUE IOHEXOL 300 MG/ML  SOLN COMPARISON:  11/11/2018 CT chest,  11/12/2018 CT abdomen and pelvis and 11/13/2018 MRI of the abdomen. FINDINGS: CT CHEST FINDINGS Cardiovascular: Thoracic aorta and  its branches are within normal limits. No cardiac enlargement is seen. No pulmonary emboli are seen although opacification is poor of the pulmonary artery. No coronary calcifications are seen. No pericardial effusion is noted. Mediastinum/Nodes: Thoracic inlet is not well visualized due to a technical abnormality within the imaging plane. No mediastinal adenopathy is noted. There is a centrally necrotic lymph node identified in the left hilum measuring 13.6 mm in short axis. The esophagus as visualized is unremarkable. Lungs/Pleura: The right lung demonstrates no focal abnormality. The left lung again demonstrates the previously seen mass lesion with displacement of the bronchial tree. This lies along the major fissure and now measures approximately 5.9 x 3.9 cm increased from the prior exam at which time it measured 4.1 x 2.9 cm. Additionally deforms the major fissure more than that seen on the prior exam consistent with the focal mass effect. Additionally there is extension of the lesion through the major fissure into the superior segment of the left lower lobe best seen on image number 26 of series 4. Some increasing interstitial thickening is noted adjacent to the mass lesion likely representing some lymphangitic spread of tumor. Additionally significant central necrosis is now seen. Musculoskeletal: No rib lesions are noted although the superior aspect of the chest again is not visualized on this exam. Mild degenerative changes of the thoracic spine are seen. No acute bony lesion is noted. CT ABDOMEN PELVIS FINDINGS Hepatobiliary: The liver again demonstrates scattered hypodensities some which demonstrate peripheral enhancement consistent with hemangiomas. These are stable from the prior CT examination. Gallbladder is incompletely distended. Some pericholecystic fluid attenuation is  noted stable from the prior exam. This was well visualized on prior CT and MR and felt to be related to adenomyomatosis. Pancreas: Unremarkable. No pancreatic ductal dilatation or surrounding inflammatory changes. Spleen: Normal in size without focal abnormality. Adrenals/Urinary Tract: Adrenal glands are within normal limits. The kidneys again demonstrate a normal enhancement pattern. Scattered small cysts are seen bilaterally stable from the prior CT. No ureteral calculi are seen. The bladder is well distended with contrast enhanced urine. Stomach/Bowel: The colon is well visualized without obstructive or inflammatory changes. The appendix is well visualized. No small bowel or gastric abnormality is seen. Vascular/Lymphatic: No significant lymphadenopathy is noted. Scattered atherosclerotic changes of the aorta and iliac vessels are seen. Reproductive: Prostate is unremarkable. Other: No abdominal wall hernia or abnormality. No abdominopelvic ascites. Musculoskeletal: No acute or significant osseous findings. IMPRESSION: Enlarging left upper lobe mass lesion with significant increased central necrosis consistent with enlarging pulmonary neoplasm. There is now extension of the lesion across the major fissure into the superior segment of the left as well as changes consistent with lymphangitic spread of carcinoma. Central left hilar adenopathy with necrosis is seen new from the prior exam. The dominant node measures 13.6 mm within the left hilum and is new from the prior exam. These changes correspond with that seen on recent MRI and CT suspicious for metastatic disease. No intra-abdominal metastatic disease is noted. Chronic changes are noted within the kidneys and liver as well as the gallbladder similar to that seen on prior CT and MRI examination. Electronically Signed   By: Inez Catalina M.D.   On: 01/12/2019 20:55   Mr Jeri Cos And Wo Contrast  Result Date: 01/13/2019 CLINICAL DATA:  58 year old male with  persistent headache. Progressive left upper lung mass, abnormal hypodensity in the right cerebellum on CT. EXAM: MRI HEAD WITHOUT AND WITH CONTRAST TECHNIQUE: Multiplanar, multiecho pulse sequences of the  brain and surrounding structures were obtained without and with intravenous contrast. CONTRAST:  7.77mL GADAVIST GADOBUTROL 1 MMOL/ML IV SOLN COMPARISON:  CTA head and neck 01/12/2019, CT head 01/05/2019. FINDINGS: Brain: Approximately 24 millimeter mostly rim enhancing mass located in the right superior cerebellum corresponds to the hypodense lesion by CT. This has spiculated margins (series 10, image 35) and is superficially located, raising the possibility of some leptomeningeal involvement. Confluent cerebellar edema which crosses midline (series 5, image 17). Subsequent partially compressed 4th ventricle. Although the lateral and 3rd ventricles remain normal without transependymal edema. Subsequent crowding of the posterior fossa with mild effacement of the cisterna magna. No overt tonsillar herniation at this time. No other enhancing brain mass or abnormal intracranial enhancement identified. No pachymeningeal thickening. The enhancing mass is visible on diffusion weighted imaging, and additionally there is a punctate area of abnormal trace diffusion in the left anterior frontal lobe white matter on series 2, image 39. This might be restricted on ADC (series 250 image 39) but there is no associated enhancement, and no discrete T2 or FLAIR correlate. No other abnormal diffusion. Minimal nonspecific cerebral white matter T2 and FLAIR hyperintensity elsewhere. No cortical encephalomalacia or chronic cerebral blood products. Suprasellar and interpeduncular cisterns remain normal. No superimposed midline shift, extra-axial collection or acute intracranial hemorrhage. Pituitary within normal limits. Vascular: Major intracranial vascular flow voids are preserved. The major dural venous sinuses are enhancing and  appear to be patent. Skull and upper cervical spine: Negative visible cervical spine and spinal cord. Visualized bone marrow signal is within normal limits. Sinuses/Orbits: Negative orbits. Trace paranasal sinus mucosal thickening. Other: Mastoids are clear. Visible internal auditory structures appear normal. Benign appearing right lateral face sebaceous cyst or similar lesion. Otherwise negative visible scalp and face soft tissues. IMPRESSION: 1. Solitary 24 mm enhancing mass in the right superior cerebellum most compatible with a solitary brain metastasis in this setting. Superficial position and spiculated margins such that early leptomeningeal involvement is not excluded. Confluent cerebellar edema which crosses midline. Mass effect in the posterior fossa, including compression of the 4th ventricle and crowding of the cisterna magna, but no ventriculomegaly or transependymal edema. 2. Superimposed punctate diffusion abnormality in the left anterior frontal lobe white matter with no associated enhancement, T2 or FLAIR signal. This is nonspecific at this time, but punctate lacunar infarct is currently favored over early metastasis. Attention directed on follow-up. 3. No other metastatic disease or acute intracranial abnormality identified. Electronically Signed   By: Genevie Ann M.D.   On: 01/13/2019 04:35   Ct Abdomen Pelvis W Contrast  Result Date: 01/12/2019 CLINICAL DATA:  Known left upper lobe lung mass EXAM: CT CHEST, ABDOMEN, AND PELVIS WITH CONTRAST TECHNIQUE: Multidetector CT imaging of the chest, abdomen and pelvis was performed following the standard protocol during bolus administration of intravenous contrast. CONTRAST:  171mL OMNIPAQUE IOHEXOL 300 MG/ML  SOLN COMPARISON:  11/11/2018 CT chest, 11/12/2018 CT abdomen and pelvis and 11/13/2018 MRI of the abdomen. FINDINGS: CT CHEST FINDINGS Cardiovascular: Thoracic aorta and its branches are within normal limits. No cardiac enlargement is seen. No  pulmonary emboli are seen although opacification is poor of the pulmonary artery. No coronary calcifications are seen. No pericardial effusion is noted. Mediastinum/Nodes: Thoracic inlet is not well visualized due to a technical abnormality within the imaging plane. No mediastinal adenopathy is noted. There is a centrally necrotic lymph node identified in the left hilum measuring 13.6 mm in short axis. The esophagus as visualized is unremarkable. Lungs/Pleura: The  right lung demonstrates no focal abnormality. The left lung again demonstrates the previously seen mass lesion with displacement of the bronchial tree. This lies along the major fissure and now measures approximately 5.9 x 3.9 cm increased from the prior exam at which time it measured 4.1 x 2.9 cm. Additionally deforms the major fissure more than that seen on the prior exam consistent with the focal mass effect. Additionally there is extension of the lesion through the major fissure into the superior segment of the left lower lobe best seen on image number 26 of series 4. Some increasing interstitial thickening is noted adjacent to the mass lesion likely representing some lymphangitic spread of tumor. Additionally significant central necrosis is now seen. Musculoskeletal: No rib lesions are noted although the superior aspect of the chest again is not visualized on this exam. Mild degenerative changes of the thoracic spine are seen. No acute bony lesion is noted. CT ABDOMEN PELVIS FINDINGS Hepatobiliary: The liver again demonstrates scattered hypodensities some which demonstrate peripheral enhancement consistent with hemangiomas. These are stable from the prior CT examination. Gallbladder is incompletely distended. Some pericholecystic fluid attenuation is noted stable from the prior exam. This was well visualized on prior CT and MR and felt to be related to adenomyomatosis. Pancreas: Unremarkable. No pancreatic ductal dilatation or surrounding inflammatory  changes. Spleen: Normal in size without focal abnormality. Adrenals/Urinary Tract: Adrenal glands are within normal limits. The kidneys again demonstrate a normal enhancement pattern. Scattered small cysts are seen bilaterally stable from the prior CT. No ureteral calculi are seen. The bladder is well distended with contrast enhanced urine. Stomach/Bowel: The colon is well visualized without obstructive or inflammatory changes. The appendix is well visualized. No small bowel or gastric abnormality is seen. Vascular/Lymphatic: No significant lymphadenopathy is noted. Scattered atherosclerotic changes of the aorta and iliac vessels are seen. Reproductive: Prostate is unremarkable. Other: No abdominal wall hernia or abnormality. No abdominopelvic ascites. Musculoskeletal: No acute or significant osseous findings. IMPRESSION: Enlarging left upper lobe mass lesion with significant increased central necrosis consistent with enlarging pulmonary neoplasm. There is now extension of the lesion across the major fissure into the superior segment of the left as well as changes consistent with lymphangitic spread of carcinoma. Central left hilar adenopathy with necrosis is seen new from the prior exam. The dominant node measures 13.6 mm within the left hilum and is new from the prior exam. These changes correspond with that seen on recent MRI and CT suspicious for metastatic disease. No intra-abdominal metastatic disease is noted. Chronic changes are noted within the kidneys and liver as well as the gallbladder similar to that seen on prior CT and MRI examination. Electronically Signed   By: Inez Catalina M.D.   On: 01/12/2019 20:55    Assessment/Plan:   LOS: 1 day  MRI completed this morning will be reviewed by Dr. Vertell Limber.   Verdis Prime 01/13/2019, 8:20 AM   Patient will need expedited biopsy of lung mass, likely by bronchoscopy.  Depending on Pathology, his solitary brain mass will be treated with stereotactic  radiosurgery ([facility]) or whole brain radiotherapy if small cell.

## 2019-01-13 NOTE — Progress Notes (Signed)
  Radiation Oncology         [phone]) [phone] ________________________________  Name: Micheal Vincent MRN: [id number]  Date: 01/12/2019  DOB: [birth year]  Chart Note:  This patient was presented in our brain conference this morning.  We reviewed his brain MRI and chest CT.  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  Given the degree of cerebellar edema, the patient would benefit from tissue diagnosis as soon as possible.  I have discussed the case with Dr. Valeta Harms of pulmonology, and he is working to coordinate bronchoscopy within the next 48 hours to obtain tissue.  ________________________________  Sheral Apley. Tammi Klippel, M.D.

## 2019-01-13 NOTE — Consult Note (Signed)
NAME:  Micheal Vincent, MRN:  [id number], DOB:  [birth year], LOS: 1 ADMISSION DATE:  01/12/2019, CONSULTATION DATE:  01/13/2019 REFERRING MD:  Dr. Tammi Klippel, Radiation Oncology, CHIEF COMPLAINT:  headache  Brief History   58 yo male smoker presented to ER with headache for few weeks.  CT head showed 3 cm Rt cerebellar mass and CT chest showed lung mass.  Pulmonary consulted to arrange for bronchoscopy and tissue sampling.  History of present illness   He was in hospital in September 2020 and had CT imaging.  Found to have Lt peritonsillar abscess, 4.3 cm spiculated mass in LUL.  He was seen by pulmonary.  Recommended to complete ABx and f/u with PET scan as outpt in 3 to 4 weeks.  Pt did not arrange f/u outpt appointment.  He has been getting pain in the back of his head.  Also getting dizzy and feeling like the room spins sometimes.  He came to ER.  CT imaging showed cerebellar mass and increased size of Lt upper lobe mass.    He denies fever, sweats, chills, weight loss, hoarseness, hemoptysis, chest pain, sputum, or gland swelling.  Past Medical History  Septic arthritis  Significant Hospital Events   11/24 Admit  Consults:  Neurosurgery  Procedures:    Significant Diagnostic Tests:  CT chest 11/24 >> central necrotic LN in Lt hilum, 5.9 x 3.9 cm LUL mass with central necrosis MRI brain 11/25 >> 2.4 cm mass Rt superior cerebellum  Micro Data:  SARS CoV2 11/24 >> negative  Antimicrobials:    Interim history/subjective:    Objective   Blood pressure 122/80, pulse 71, temperature 97.9 F (36.6 C), temperature source Oral, resp. rate 18, height 6' (1.829 m), weight 72 kg, SpO2 97 %.        Intake/Output Summary (Last 24 hours) at 01/13/2019 1317 Last data filed at 01/12/2019 2100 Gross per 24 hour  Intake -  Output 1600 ml  Net -1600 ml   Filed Weights   01/12/19 1132 01/12/19 2056  Weight: 70.3 kg 72 kg    Examination:  Appearance - well kempt   ENMT - no sinus  tenderness, no nasal discharge, no oral exudate, poor dentition  Neck - no masses, trachea midline, no thyromegaly, no elevation in JVP  Respiratory - normal appearance of chest wall, normal respiratory effort w/o accessory muscle use, no dullness on percussion, no wheezing or rales  CV - s1s2 regular rate and rhythm, no murmurs, no peripheral edema, radial pulses symmetric  GI - soft, non tender  Lymph - no adenopathy noted in neck and axillary areas  MSK - normal gait  Ext - no cyanosis, clubbing, or joint inflammation noted  Skin - no rashes, lesions, or ulcers  Neuro - normal strength, oriented x 3  Psych - normal mood and affect    Assessment & Plan:   57 yo male smoker with enlarging Lt upper lobe mass with Lt hilar adenopathy and Rt cerebellar mass. - this is primary lung cancer until proven otherwise - have arranged for bronchoscopy with Dr. Leroy Sea Icard to be done on Friday 01/15/19 - procedure reviewed with pt - risks of procedure detailed as bleeding, infection, pneumothorax, and non diagnosis - he is agreeable to proceed with bronchoscopy  Rt cerebellar mass. - neurosurgery consulted  - radiation oncology assessing for XRT - keppra, decadron per primary team and neurosurgery   Best practice:  Diet: regular DVT prophylaxis: Lovenox GI prophylaxis: Not indicated Mobility: OOB to chair  Code Status: full code   Labs   CBC: Recent Labs  Lab 01/12/19 1457 01/13/19 0245  WBC 6.1 7.0  HGB 13.1 12.3*  HCT 40.4 37.0*  MCV 87.1 84.7  PLT 532* 537*    Basic Metabolic Panel: Recent Labs  Lab 01/12/19 1457 01/12/19 1950 01/13/19 0245  NA 138  --  135  K 4.0  --  4.5  CL 105  --  99  CO2 24  --  25  GLUCOSE 91  --  136*  BUN 8  --  12  CREATININE 0.84  --  0.84  CALCIUM 8.9  --  9.0  MG  --  2.5*  --   PHOS  --  3.1  --     Coagulation Profile: Recent Labs  Lab 01/12/19 2116  INR 1.3*    CBG: Recent Labs  Lab 01/13/19 0615  GLUCAP  138*    Review of Systems:   Reviewed and negative  Past Medical History  He,  has a past medical history of Septic arthritis ([place]).   Surgical History    Past Surgical History:  Procedure Laterality Date  . knee surgery       Social History   reports that he has been smoking cigarettes. He has been smoking about 1.50 packs per day. He has never used smokeless tobacco. He reports current alcohol use. He reports current drug use. Frequency: 6.00 times per week. Drugs: Cocaine and Marijuana.   Family History   His family history includes Cancer in his father and maternal aunt; Diabetes in his mother; Heart disease in his mother; Hypertension in his mother.   Allergies No Known Allergies   Home Medications  Prior to Admission medications   Medication Sig Start Date End Date Taking? Authorizing Provider  methocarbamol (ROBAXIN) 500 MG tablet Take 1 tablet (500 mg total) by mouth 2 (two) times daily. 01/05/19  Yes Khatri, Nicanor Alcon, PA-C    Chesley Mires, MD [place] 01/13/2019, 1:51 PM

## 2019-01-13 NOTE — Progress Notes (Signed)
  PCCM:  Case discussed with Dr. Tammi Klippel.  I have spoke with the operating room we will plan for bronchoscopy with navigation and endobronchial ultrasound for Friday January 15, 2019.  Case will be delayed by 1 day as the OR is open for urgent/emergent cases only tomorrow.  Case ID: [id number] Start time: 10:00AM  Recommendations: NPO midnight night before procedure.  Full consultation note to follow.  I have spoke with our inpatient team who will see the patient today.  I will be in the hospital tomorrow and able to meet Micheal Vincent then.  Micheal Nash, DO [facility] 01/13/2019 12:30 PM

## 2019-01-13 NOTE — Progress Notes (Signed)
PROGRESS NOTE    Micheal Vincent  MLY:[id number] DOB: 18-May-1960 DOA: 01/12/2019 PCP: Patient, No Pcp Per   Brief Narrative:  58 year old male with history of recently discovered left upper lobe mass, cocaine, alcohol abuse presented to the emergency department with complaints of intractable headache.  Left upper lobe mass was discovered on CT chest in September 2020, no biopsy has been done.  Patient has been having headache, gait instability, numbness and tingling in his hands and feet for the last few weeks.  Patient also has been having chronic cough, night sweats, sporadic fevers and chills.  Patient continues to smoke 1 pack a day, has 40 pack year history of smoking.  Patient has been homeless, however has been in jail multiple times in the past.  Work-up in the emergency department with a CT head and neck demonstrated 3 cm right cerebellar mass with surrounding edema and mass-effect, 5 cm cavitary left upper lobe mass with associated mild mediastinal lymphadenopathy.  Lab work-up is unremarkable  Assessment & Plan:   Active Problems:   Cerebellar mass   ##Left cerebellar mass -Neurosurgery is consulted -Recommended to obtain lung biopsy of the left upper lobe mass -Keep the patient on Decadron  Left upper lobe mass -Highly concerning about stage IV malignancy -Get CT-guided biopsy of the lung -Consulted pulmonary, plan to do bronchoscopy on 01/14/2019   Continued tobacco use -Keep the patient on nicotine patch  Homeless state -Complicating medical treatment   DVT prophylaxis: Heparin Code Status: Full code  family Communication: Patient  disposition Plan: Pending work-up and clinical improvement Consultants:   Neurosurgery  Pulmonary  Procedures:   Antimicrobials:  Subjective: Has headache, numbness.  Objective: Vitals:   01/12/19 2056 01/12/19 2344 01/13/19 0428 01/13/19 0831  BP: 134/83 134/77 (!) 144/78 120/86  Pulse: 81 86 72 71  Resp: 18 16 16 18     Temp: 98.6 F (37 C) 97.7 F (36.5 C) 97.7 F (36.5 C) 97.7 F (36.5 C)  TempSrc: Oral Oral Oral Oral  SpO2: 98% 96% 99% 100%  Weight: 72 kg     Height: 6' (1.829 m)       Intake/Output Summary (Last 24 hours) at 01/13/2019 1000 Last data filed at 01/12/2019 2100 Gross per 24 hour  Intake --  Output 1600 ml  Net -1600 ml   Filed Weights   01/12/19 1132 01/12/19 2056  Weight: 70.3 kg 72 kg    Examination:  General exam: Appears calm and comfortable  Respiratory system: Clear to auscultation. Respiratory effort normal. Cardiovascular system: S1 & S2 heard, RRR. No JVD, murmurs, rubs, gallops or clicks. No pedal edema. Gastrointestinal system: Abdomen is nondistended, soft and nontender. No organomegaly or masses felt. Normal bowel sounds heard. Central nervous system: Alert and oriented. No focal neurological deficits. Extremities: Symmetric 5 x 5 power. Skin: No rashes, lesions or ulcers Psychiatry: Judgement and insight appear normal. Mood & affect appropriate.     Data Reviewed: I have personally reviewed following labs and imaging studies  CBC: Recent Labs  Lab 01/12/19 1457 01/13/19 0245  WBC 6.1 7.0  HGB 13.1 12.3*  HCT 40.4 37.0*  MCV 87.1 84.7  PLT 532* 812*   Basic Metabolic Panel: Recent Labs  Lab 01/12/19 1457 01/12/19 1950 01/13/19 0245  NA 138  --  135  K 4.0  --  4.5  CL 105  --  99  CO2 24  --  25  GLUCOSE 91  --  136*  BUN 8  --  12  CREATININE 0.84  --  0.84  CALCIUM 8.9  --  9.0  MG  --  2.5*  --   PHOS  --  3.1  --    GFR: Estimated Creatinine Clearance: 97.6 mL/min (by C-G formula based on SCr of 0.84 mg/dL). Liver Function Tests: No results for input(s): AST, ALT, ALKPHOS, BILITOT, PROT, ALBUMIN in the last 168 hours. No results for input(s): LIPASE, AMYLASE in the last 168 hours. No results for input(s): AMMONIA in the last 168 hours. Coagulation Profile: Recent Labs  Lab 01/12/19 2116  INR 1.3*   Cardiac Enzymes: No  results for input(s): CKTOTAL, CKMB, CKMBINDEX, TROPONINI in the last 168 hours. BNP (last 3 results) No results for input(s): PROBNP in the last 8760 hours. HbA1C: No results for input(s): HGBA1C in the last 72 hours. CBG: Recent Labs  Lab 01/13/19 0615  GLUCAP 138*   Lipid Profile: No results for input(s): CHOL, HDL, LDLCALC, TRIG, CHOLHDL, LDLDIRECT in the last 72 hours. Thyroid Function Tests: No results for input(s): TSH, T4TOTAL, FREET4, T3FREE, THYROIDAB in the last 72 hours. Anemia Panel: No results for input(s): VITAMINB12, FOLATE, FERRITIN, TIBC, IRON, RETICCTPCT in the last 72 hours. Sepsis Labs: No results for input(s): PROCALCITON, LATICACIDVEN in the last 168 hours.  Recent Results (from the past 240 hour(s))  SARS Coronavirus 2 by RT PCR (hospital order, performed in [facility] hospital lab) Nasopharyngeal Nasopharyngeal Swab     Status: None   Collection Time: 01/12/19  7:29 PM   Specimen: Nasopharyngeal Swab  Result Value Ref Range Status   SARS Coronavirus 2 NEGATIVE NEGATIVE Final    Comment: (NOTE) SARS-CoV-2 target nucleic acids are NOT DETECTED. The SARS-CoV-2 RNA is generally detectable in upper and lower respiratory specimens during the acute phase of infection. The lowest concentration of SARS-CoV-2 viral copies this assay can detect is 250 copies / mL. A negative result does not preclude SARS-CoV-2 infection and should not be used as the sole basis for treatment or other patient management decisions.  A negative result may occur with improper specimen collection / handling, submission of specimen other than nasopharyngeal swab, presence of viral mutation(s) within the areas targeted by this assay, and inadequate number of viral copies (<250 copies / mL). A negative result must be combined with clinical observations, patient history, and epidemiological information. Fact Sheet for Patients:   StrictlyIdeas.no Fact Sheet for  Healthcare Providers: BankingDealers.co.za This test is not yet approved or cleared  by the [place] FDA and has been authorized for detection and/or diagnosis of SARS-CoV-2 by FDA under an Emergency Use Authorization (EUA).  This EUA will remain in effect (meaning this test can be used) for the duration of the COVID-19 declaration under Section 564(b)(1) of the Act, 21 U.S.C. section 360bbb-3(b)(1), unless the authorization is terminated or revoked sooner. Performed at [facility], [place] [address], [place], [place] [address]          Radiology Studies: Ct Angio Head W/cm &/or Wo Cm  Result Date: 01/12/2019 CLINICAL DATA:  Rule out subarachnoid hemorrhage. Headache for several weeks. Lung mass. EXAM: CT ANGIOGRAPHY HEAD AND NECK TECHNIQUE: Multidetector CT imaging of the head and neck was performed using the standard protocol during bolus administration of intravenous contrast. Multiplanar CT image reconstructions and MIPs were obtained to evaluate the vascular anatomy. Carotid stenosis measurements (when applicable) are obtained utilizing NASCET criteria, using the distal internal carotid diameter as the denominator. CONTRAST:  69mL OMNIPAQUE IOHEXOL 350 MG/ML  SOLN COMPARISON:  CT head 01/05/2019 FINDINGS: CT HEAD FINDINGS Brain: Hypodensity right cerebellum with mild progression. This measures approximately 3 cm. There is progressive edema and mass-effect on the fourth ventricle. No hemorrhage. Ventricle size normal. No midline shift. Vascular: Negative for hyperdense vessel. Skull: Negative Sinuses: Negative Orbits: Negative Review of the MIP images confirms the above findings CTA NECK FINDINGS Aortic arch: Standard branching. Imaged portion shows no evidence of aneurysm or dissection. No significant stenosis of the major arch vessel origins. Right carotid system: Right carotid widely patent. Mild atherosclerotic disease right carotid bulb. Left carotid  system: Left carotid widely patent. Mild atherosclerotic disease left carotid bulb. Vertebral arteries: Both vertebral arteries are patent to the basilar without stenosis or irregularity Skeleton: No acute spinal abnormality. Poor dentition with extensive caries and periapical lucencies due to dental infections. Other neck: Negative for mass or adenopathy Upper chest: 5 cm left upper lobe mass with mediastinal adenopathy consistent with carcinoma of the lung. The mass and adenopathy have progressed since chest CT 11/11/2018 Review of the MIP images confirms the above findings CTA HEAD FINDINGS Anterior circulation: Cavernous carotid patent bilaterally without stenosis. Anterior and middle cerebral arteries patent bilaterally without stenosis or occlusion Posterior circulation: Both vertebral arteries patent to the basilar. PICA patent bilaterally. Basilar patent. Superior cerebellar and posterior cerebral arteries patent bilaterally without stenosis. Venous sinuses: Normal venous enhancement. Right cerebellar hypodensity does not show significant enhancement by CT however this is arterial phase scanning. No delayed imaging performed of the head. Review of the MIP images confirms the above findings IMPRESSION: 3 cm hypodensity right cerebellum appears larger with increased mass effect compared to the recent head CT of 01/05/2019. Given the left upper lobe lung mass, this is most likely metastatic disease. Recommend MRI brain without with contrast. No significant intracranial or extracranial arterial stenosis 5 cm left upper lobe mass with adenopathy, this has progressed since chest CT of 11/11/2018 These results were called by telephone at the time of interpretation on 01/12/2019 at 6:07 pm to provider Nanda Quinton , who verbally acknowledged these results. Electronically Signed   By: Franchot Gallo M.D.   On: 01/12/2019 18:07   Ct Angio Neck W And/or Wo Contrast  Result Date: 01/12/2019 CLINICAL DATA:  Rule out  subarachnoid hemorrhage. Headache for several weeks. Lung mass. EXAM: CT ANGIOGRAPHY HEAD AND NECK TECHNIQUE: Multidetector CT imaging of the head and neck was performed using the standard protocol during bolus administration of intravenous contrast. Multiplanar CT image reconstructions and MIPs were obtained to evaluate the vascular anatomy. Carotid stenosis measurements (when applicable) are obtained utilizing NASCET criteria, using the distal internal carotid diameter as the denominator. CONTRAST:  59mL OMNIPAQUE IOHEXOL 350 MG/ML SOLN COMPARISON:  CT head 01/05/2019 FINDINGS: CT HEAD FINDINGS Brain: Hypodensity right cerebellum with mild progression. This measures approximately 3 cm. There is progressive edema and mass-effect on the fourth ventricle. No hemorrhage. Ventricle size normal. No midline shift. Vascular: Negative for hyperdense vessel. Skull: Negative Sinuses: Negative Orbits: Negative Review of the MIP images confirms the above findings CTA NECK FINDINGS Aortic arch: Standard branching. Imaged portion shows no evidence of aneurysm or dissection. No significant stenosis of the major arch vessel origins. Right carotid system: Right carotid widely patent. Mild atherosclerotic disease right carotid bulb. Left carotid system: Left carotid widely patent. Mild atherosclerotic disease left carotid bulb. Vertebral arteries: Both vertebral arteries are patent to the basilar without stenosis or irregularity Skeleton: No acute spinal abnormality. Poor dentition with extensive caries and  periapical lucencies due to dental infections. Other neck: Negative for mass or adenopathy Upper chest: 5 cm left upper lobe mass with mediastinal adenopathy consistent with carcinoma of the lung. The mass and adenopathy have progressed since chest CT 11/11/2018 Review of the MIP images confirms the above findings CTA HEAD FINDINGS Anterior circulation: Cavernous carotid patent bilaterally without stenosis. Anterior and middle  cerebral arteries patent bilaterally without stenosis or occlusion Posterior circulation: Both vertebral arteries patent to the basilar. PICA patent bilaterally. Basilar patent. Superior cerebellar and posterior cerebral arteries patent bilaterally without stenosis. Venous sinuses: Normal venous enhancement. Right cerebellar hypodensity does not show significant enhancement by CT however this is arterial phase scanning. No delayed imaging performed of the head. Review of the MIP images confirms the above findings IMPRESSION: 3 cm hypodensity right cerebellum appears larger with increased mass effect compared to the recent head CT of 01/05/2019. Given the left upper lobe lung mass, this is most likely metastatic disease. Recommend MRI brain without with contrast. No significant intracranial or extracranial arterial stenosis 5 cm left upper lobe mass with adenopathy, this has progressed since chest CT of 11/11/2018 These results were called by telephone at the time of interpretation on 01/12/2019 at 6:07 pm to provider Nanda Quinton , who verbally acknowledged these results. Electronically Signed   By: Franchot Gallo M.D.   On: 01/12/2019 18:07   Ct Chest W Contrast  Result Date: 01/12/2019 CLINICAL DATA:  Known left upper lobe lung mass EXAM: CT CHEST, ABDOMEN, AND PELVIS WITH CONTRAST TECHNIQUE: Multidetector CT imaging of the chest, abdomen and pelvis was performed following the standard protocol during bolus administration of intravenous contrast. CONTRAST:  148mL OMNIPAQUE IOHEXOL 300 MG/ML  SOLN COMPARISON:  11/11/2018 CT chest, 11/12/2018 CT abdomen and pelvis and 11/13/2018 MRI of the abdomen. FINDINGS: CT CHEST FINDINGS Cardiovascular: Thoracic aorta and its branches are within normal limits. No cardiac enlargement is seen. No pulmonary emboli are seen although opacification is poor of the pulmonary artery. No coronary calcifications are seen. No pericardial effusion is noted. Mediastinum/Nodes: Thoracic  inlet is not well visualized due to a technical abnormality within the imaging plane. No mediastinal adenopathy is noted. There is a centrally necrotic lymph node identified in the left hilum measuring 13.6 mm in short axis. The esophagus as visualized is unremarkable. Lungs/Pleura: The right lung demonstrates no focal abnormality. The left lung again demonstrates the previously seen mass lesion with displacement of the bronchial tree. This lies along the major fissure and now measures approximately 5.9 x 3.9 cm increased from the prior exam at which time it measured 4.1 x 2.9 cm. Additionally deforms the major fissure more than that seen on the prior exam consistent with the focal mass effect. Additionally there is extension of the lesion through the major fissure into the superior segment of the left lower lobe best seen on image number 26 of series 4. Some increasing interstitial thickening is noted adjacent to the mass lesion likely representing some lymphangitic spread of tumor. Additionally significant central necrosis is now seen. Musculoskeletal: No rib lesions are noted although the superior aspect of the chest again is not visualized on this exam. Mild degenerative changes of the thoracic spine are seen. No acute bony lesion is noted. CT ABDOMEN PELVIS FINDINGS Hepatobiliary: The liver again demonstrates scattered hypodensities some which demonstrate peripheral enhancement consistent with hemangiomas. These are stable from the prior CT examination. Gallbladder is incompletely distended. Some pericholecystic fluid attenuation is noted stable from the prior exam. This  was well visualized on prior CT and MR and felt to be related to adenomyomatosis. Pancreas: Unremarkable. No pancreatic ductal dilatation or surrounding inflammatory changes. Spleen: Normal in size without focal abnormality. Adrenals/Urinary Tract: Adrenal glands are within normal limits. The kidneys again demonstrate a normal enhancement  pattern. Scattered small cysts are seen bilaterally stable from the prior CT. No ureteral calculi are seen. The bladder is well distended with contrast enhanced urine. Stomach/Bowel: The colon is well visualized without obstructive or inflammatory changes. The appendix is well visualized. No small bowel or gastric abnormality is seen. Vascular/Lymphatic: No significant lymphadenopathy is noted. Scattered atherosclerotic changes of the aorta and iliac vessels are seen. Reproductive: Prostate is unremarkable. Other: No abdominal wall hernia or abnormality. No abdominopelvic ascites. Musculoskeletal: No acute or significant osseous findings. IMPRESSION: Enlarging left upper lobe mass lesion with significant increased central necrosis consistent with enlarging pulmonary neoplasm. There is now extension of the lesion across the major fissure into the superior segment of the left as well as changes consistent with lymphangitic spread of carcinoma. Central left hilar adenopathy with necrosis is seen new from the prior exam. The dominant node measures 13.6 mm within the left hilum and is new from the prior exam. These changes correspond with that seen on recent MRI and CT suspicious for metastatic disease. No intra-abdominal metastatic disease is noted. Chronic changes are noted within the kidneys and liver as well as the gallbladder similar to that seen on prior CT and MRI examination. Electronically Signed   By: Inez Catalina M.D.   On: 01/12/2019 20:55   Mr Jeri Cos And Wo Contrast  Result Date: 01/13/2019 CLINICAL DATA:  58 year old male with persistent headache. Progressive left upper lung mass, abnormal hypodensity in the right cerebellum on CT. EXAM: MRI HEAD WITHOUT AND WITH CONTRAST TECHNIQUE: Multiplanar, multiecho pulse sequences of the brain and surrounding structures were obtained without and with intravenous contrast. CONTRAST:  7.92mL GADAVIST GADOBUTROL 1 MMOL/ML IV SOLN COMPARISON:  CTA head and neck  01/12/2019, CT head 01/05/2019. FINDINGS: Brain: Approximately 24 millimeter mostly rim enhancing mass located in the right superior cerebellum corresponds to the hypodense lesion by CT. This has spiculated margins (series 10, image 35) and is superficially located, raising the possibility of some leptomeningeal involvement. Confluent cerebellar edema which crosses midline (series 5, image 17). Subsequent partially compressed 4th ventricle. Although the lateral and 3rd ventricles remain normal without transependymal edema. Subsequent crowding of the posterior fossa with mild effacement of the cisterna magna. No overt tonsillar herniation at this time. No other enhancing brain mass or abnormal intracranial enhancement identified. No pachymeningeal thickening. The enhancing mass is visible on diffusion weighted imaging, and additionally there is a punctate area of abnormal trace diffusion in the left anterior frontal lobe white matter on series 2, image 39. This might be restricted on ADC (series 250 image 39) but there is no associated enhancement, and no discrete T2 or FLAIR correlate. No other abnormal diffusion. Minimal nonspecific cerebral white matter T2 and FLAIR hyperintensity elsewhere. No cortical encephalomalacia or chronic cerebral blood products. Suprasellar and interpeduncular cisterns remain normal. No superimposed midline shift, extra-axial collection or acute intracranial hemorrhage. Pituitary within normal limits. Vascular: Major intracranial vascular flow voids are preserved. The major dural venous sinuses are enhancing and appear to be patent. Skull and upper cervical spine: Negative visible cervical spine and spinal cord. Visualized bone marrow signal is within normal limits. Sinuses/Orbits: Negative orbits. Trace paranasal sinus mucosal thickening. Other: Mastoids are clear. Visible internal  auditory structures appear normal. Benign appearing right lateral face sebaceous cyst or similar lesion.  Otherwise negative visible scalp and face soft tissues. IMPRESSION: 1. Solitary 24 mm enhancing mass in the right superior cerebellum most compatible with a solitary brain metastasis in this setting. Superficial position and spiculated margins such that early leptomeningeal involvement is not excluded. Confluent cerebellar edema which crosses midline. Mass effect in the posterior fossa, including compression of the 4th ventricle and crowding of the cisterna magna, but no ventriculomegaly or transependymal edema. 2. Superimposed punctate diffusion abnormality in the left anterior frontal lobe white matter with no associated enhancement, T2 or FLAIR signal. This is nonspecific at this time, but punctate lacunar infarct is currently favored over early metastasis. Attention directed on follow-up. 3. No other metastatic disease or acute intracranial abnormality identified. Electronically Signed   By: Genevie Ann M.D.   On: 01/13/2019 04:35   Ct Abdomen Pelvis W Contrast  Result Date: 01/12/2019 CLINICAL DATA:  Known left upper lobe lung mass EXAM: CT CHEST, ABDOMEN, AND PELVIS WITH CONTRAST TECHNIQUE: Multidetector CT imaging of the chest, abdomen and pelvis was performed following the standard protocol during bolus administration of intravenous contrast. CONTRAST:  133mL OMNIPAQUE IOHEXOL 300 MG/ML  SOLN COMPARISON:  11/11/2018 CT chest, 11/12/2018 CT abdomen and pelvis and 11/13/2018 MRI of the abdomen. FINDINGS: CT CHEST FINDINGS Cardiovascular: Thoracic aorta and its branches are within normal limits. No cardiac enlargement is seen. No pulmonary emboli are seen although opacification is poor of the pulmonary artery. No coronary calcifications are seen. No pericardial effusion is noted. Mediastinum/Nodes: Thoracic inlet is not well visualized due to a technical abnormality within the imaging plane. No mediastinal adenopathy is noted. There is a centrally necrotic lymph node identified in the left hilum measuring 13.6  mm in short axis. The esophagus as visualized is unremarkable. Lungs/Pleura: The right lung demonstrates no focal abnormality. The left lung again demonstrates the previously seen mass lesion with displacement of the bronchial tree. This lies along the major fissure and now measures approximately 5.9 x 3.9 cm increased from the prior exam at which time it measured 4.1 x 2.9 cm. Additionally deforms the major fissure more than that seen on the prior exam consistent with the focal mass effect. Additionally there is extension of the lesion through the major fissure into the superior segment of the left lower lobe best seen on image number 26 of series 4. Some increasing interstitial thickening is noted adjacent to the mass lesion likely representing some lymphangitic spread of tumor. Additionally significant central necrosis is now seen. Musculoskeletal: No rib lesions are noted although the superior aspect of the chest again is not visualized on this exam. Mild degenerative changes of the thoracic spine are seen. No acute bony lesion is noted. CT ABDOMEN PELVIS FINDINGS Hepatobiliary: The liver again demonstrates scattered hypodensities some which demonstrate peripheral enhancement consistent with hemangiomas. These are stable from the prior CT examination. Gallbladder is incompletely distended. Some pericholecystic fluid attenuation is noted stable from the prior exam. This was well visualized on prior CT and MR and felt to be related to adenomyomatosis. Pancreas: Unremarkable. No pancreatic ductal dilatation or surrounding inflammatory changes. Spleen: Normal in size without focal abnormality. Adrenals/Urinary Tract: Adrenal glands are within normal limits. The kidneys again demonstrate a normal enhancement pattern. Scattered small cysts are seen bilaterally stable from the prior CT. No ureteral calculi are seen. The bladder is well distended with contrast enhanced urine. Stomach/Bowel: The colon is well visualized  without  obstructive or inflammatory changes. The appendix is well visualized. No small bowel or gastric abnormality is seen. Vascular/Lymphatic: No significant lymphadenopathy is noted. Scattered atherosclerotic changes of the aorta and iliac vessels are seen. Reproductive: Prostate is unremarkable. Other: No abdominal wall hernia or abnormality. No abdominopelvic ascites. Musculoskeletal: No acute or significant osseous findings. IMPRESSION: Enlarging left upper lobe mass lesion with significant increased central necrosis consistent with enlarging pulmonary neoplasm. There is now extension of the lesion across the major fissure into the superior segment of the left as well as changes consistent with lymphangitic spread of carcinoma. Central left hilar adenopathy with necrosis is seen new from the prior exam. The dominant node measures 13.6 mm within the left hilum and is new from the prior exam. These changes correspond with that seen on recent MRI and CT suspicious for metastatic disease. No intra-abdominal metastatic disease is noted. Chronic changes are noted within the kidneys and liver as well as the gallbladder similar to that seen on prior CT and MRI examination. Electronically Signed   By: Inez Catalina M.D.   On: 01/12/2019 20:55        Scheduled Meds:  dexamethasone (DECADRON) injection  4 mg Intravenous Q6H   docusate sodium  100 mg Oral BID   enoxaparin (LOVENOX) injection  40 mg Subcutaneous Y58P   folic acid  1 mg Oral Daily   levETIRAcetam  500 mg Oral BID   multivitamin with minerals  1 tablet Oral Daily   senna  1 tablet Oral BID   sodium chloride flush  3 mL Intravenous Q12H   thiamine  100 mg Oral Daily   Continuous Infusions:   LOS: 1 day    Time spent: 35 minutes   Justn Quale, MD [facility] Pager [phone]  If 7PM-7AM, please contact night-coverage [email or web address] Password [email or web address] 01/13/2019, 10:00 AM

## 2019-01-14 DIAGNOSIS — R519 Headache, unspecified: Secondary | ICD-10-CM

## 2019-01-14 DIAGNOSIS — R918 Other nonspecific abnormal finding of lung field: Secondary | ICD-10-CM

## 2019-01-14 DIAGNOSIS — Z720 Tobacco use: Secondary | ICD-10-CM

## 2019-01-14 DIAGNOSIS — G936 Cerebral edema: Secondary | ICD-10-CM

## 2019-01-14 LAB — GLUCOSE, CAPILLARY: Glucose-Capillary: 165 mg/dL — ABNORMAL HIGH (ref 70–99)

## 2019-01-14 NOTE — Progress Notes (Signed)
Hold Lovenox per MD

## 2019-01-14 NOTE — Anesthesia Preprocedure Evaluation (Addendum)
Anesthesia Evaluation  Patient identified by MRN, date of birth, ID band Patient awake    Reviewed: Allergy & Precautions, NPO status , Patient's Chart, lab work & pertinent test results  Airway Mallampati: II  TM Distance: >3 FB Neck ROM: Full    Dental no notable dental hx. (+) Poor Dentition,    Pulmonary Current Smoker,    Pulmonary exam normal breath sounds clear to auscultation       Cardiovascular negative cardio ROS Normal cardiovascular exam Rhythm:Regular Rate:Normal     Neuro/Psych negative neurological ROS  negative psych ROS   GI/Hepatic negative GI ROS, (+)     substance abuse  alcohol use and cocaine use,   Endo/Other  negative endocrine ROS  Renal/GU K+ 4.5 Cr 0.84     Musculoskeletal  (+) Arthritis ,   Abdominal   Peds  Hematology hgb 12.3 plt 537   Anesthesia Other Findings LUL mass  Reproductive/Obstetrics                            Anesthesia Physical Anesthesia Plan  ASA: III  Anesthesia Plan: General   Post-op Pain Management:    Induction: Intravenous  PONV Risk Score and Plan: Treatment may vary due to age or medical condition, Midazolam and Ondansetron  Airway Management Planned: Oral ETT  Additional Equipment: None  Intra-op Plan:   Post-operative Plan: Extubation in OR  Informed Consent: I have reviewed the patients History and Physical, chart, labs and discussed the procedure including the risks, benefits and alternatives for the proposed anesthesia with the patient or authorized representative who has indicated his/her understanding and acceptance.     Dental advisory given  Plan Discussed with: CRNA  Anesthesia Plan Comments: (*5 [place] Progression Recent Vital Signs  BP (!) 150/87 (BP Location: Left Arm)   Pulse 70   Temp 37.4 C (Oral)   Resp 16   Ht 6' (1.829 m)   Wt 72 kg   SpO2 100%   BMI 21.53 kg/m    Past Medical  History: No date: Septic arthritis [facility])   Expected Discharge Date    Diet Order            Diet NPO time specified  Diet effective midnight        Diet NPO time specified  Diet effective midnight        Diet regular Room service appropriate? Yes; Fluid consistency: Thin   Diet effective now              VTE Documentation Other (Comment)(lovenox)   Work Intensity Score/Level of Care 1  @LEVELOFCARE @   Mobility Head of Bed Elevated : Self regulated Level of Assistance: Independent Assistive Device: None Transport method: Wheelchair     Consult Orders  (From admission, onward)         Start     Ordered   01/14/19 1716  Consult to social work  Once    Provider:  (Not yet assigned)  Question:  Reason for Consult:  Answer:  Homeless issues   01/14/19 1716          Significant Events 8.5 - 9.0 ETT 18g IV   - )       Anesthesia Quick Evaluation

## 2019-01-14 NOTE — H&P (View-Only) (Signed)
NAME:  Micheal Vincent, MRN:  [id number], DOB:  [birth year], LOS: 2 ADMISSION DATE:  01/12/2019, CONSULTATION DATE:  01/13/2019 REFERRING MD:  Dr. Tammi Klippel, Radiation Oncology, CHIEF COMPLAINT:  headache  Brief History   58 yo male smoker presented to ER with headache for few weeks.  CT head showed 3 cm Rt cerebellar mass and CT chest showed lung mass.  Pulmonary consulted to arrange for bronchoscopy and tissue sampling.  History of present illness   He was in hospital in September 2020 and had CT imaging.  Found to have Lt peritonsillar abscess, 4.3 cm spiculated mass in LUL.  He was seen by pulmonary.  Recommended to complete ABx and f/u with PET scan as outpt in 3 to 4 weeks.  Pt did not arrange f/u outpt appointment.  He has been getting pain in the back of his head.  Also getting dizzy and feeling like the room spins sometimes.  He came to ER.  CT imaging showed cerebellar mass and increased size of Lt upper lobe mass.    He denies fever, sweats, chills, weight loss, hoarseness, hemoptysis, chest pain, sputum, or gland swelling.  Past Medical History  Septic arthritis  Significant Hospital Events   11/24 Admit  Consults:  Neurosurgery  Procedures:  01/15/19 Bronch for tissue typing to determine treatment  Significant Diagnostic Tests:  CT chest 11/24 >> central necrotic LN in Lt hilum, 5.9 x 3.9 cm LUL mass with central necrosis MRI brain 11/25 >> 2.4 cm mass Rt superior cerebellum  Micro Data:  SARS CoV2 11/24 >> negative  Antimicrobials:  None  Interim history/subjective:  Awake and alert, appropriate, In NAD States he is ready to get bronch done 11/27  Objective   Blood pressure 117/81, pulse 71, temperature 98.1 F (36.7 C), temperature source Oral, resp. rate 18, height 6' (1.829 m), weight 72 kg, SpO2 96 %.        Intake/Output Summary (Last 24 hours) at 01/14/2019 0740 Last data filed at 01/14/2019 0500 Gross per 24 hour  Intake 815 ml  Output -  Net 815 ml    Filed Weights   01/12/19 1132 01/12/19 2056  Weight: 70.3 kg 72 kg    Physical Exam  Appearance - supine in bed,awake and alert and appropriate,  In NAD  ENMT - NCAT no sinus tenderness, no nasal discharge, no oral exudate, poor dentition  Neck - no masses, trachea midline, no thyromegaly, no elevation in JVP, No LAD  Respiratory - Bilateral chest excursion, normal appearance of chest wall, normal respiratory effort w/o accessory muscle use, no dullness on percussion,clear throughout,  no wheezing or rales  CV - s1s2 regular rate and rhythm, no murmurs, no peripheral edema, radial pulses symmetric  GI - soft, non tender, ND, BS +, Body mass index is 21.53 kg/m.   MSK - normal gait, normal muscle bulk and tone  Ext - No obvious deformities, no cyanosis, clubbing, or joint inflammation noted  Skin - no rashes, lesions, or ulcers, warm and dry  Neuro - normal strength, oriented x 3, alert and appropriate  Psych - normal mood and affect, asking appropriate questions    Assessment & Plan:   58 yo male smoker with enlarging Lt upper lobe mass with Lt hilar adenopathy and Rt cerebellar mass. - this is primary lung cancer until proven otherwise - for bronchoscopy with Dr. Leroy Sea Tyrisha Benninger to be done on Friday 01/15/19 - procedure reviewed with pt, he is in agreement with proceeding - risks of  procedure detailed as bleeding, infection, pneumothorax, and non diagnosis   Rt cerebellar mass. - Neurosurgery consulted -  Tissue sampling per bronch>>solitary brain mass will be treated with stereotactic       radiosurgery (NSCLCA) or whole brain radiotherapy if small cell.   - radiation oncology assessing for XRT - Continue keppra, decadron per primary team and neurosurgery  Pt is aware of bronch scheduled for 11/27 .   Magdalen Spatz, MSN, AGACNP-BC [place] Pager # [phone] After 4 pm please call [phone]   CC APP Time 35 minutes   Best  practice:  Diet: regular DVT prophylaxis: Lovenox GI prophylaxis: Not indicated Mobility: OOB to chair Code Status: full code   Labs   CBC: Recent Labs  Lab 01/12/19 1457 01/13/19 0245  WBC 6.1 7.0  HGB 13.1 12.3*  HCT 40.4 37.0*  MCV 87.1 84.7  PLT 532* 537*    Basic Metabolic Panel: Recent Labs  Lab 01/12/19 1457 01/12/19 1950 01/13/19 0245  NA 138  --  135  K 4.0  --  4.5  CL 105  --  99  CO2 24  --  25  GLUCOSE 91  --  136*  BUN 8  --  12  CREATININE 0.84  --  0.84  CALCIUM 8.9  --  9.0  MG  --  2.5*  --   PHOS  --  3.1  --     Coagulation Profile: Recent Labs  Lab 01/12/19 2116  INR 1.3*    CBG: Recent Labs  Lab 01/13/19 0615 01/14/19 0609  GLUCAP 138* 165*    Review of Systems:   Reviewed and negative  Past Medical History  He,  has a past medical history of Septic arthritis ([place]).   Surgical History    Past Surgical History:  Procedure Laterality Date  . knee surgery       Social History   reports that he has been smoking cigarettes. He has been smoking about 1.50 packs per day. He has never used smokeless tobacco. He reports current alcohol use. He reports current drug use. Frequency: 6.00 times per week. Drugs: Cocaine and Marijuana.   Family History   His family history includes Cancer in his father and maternal aunt; Diabetes in his mother; Heart disease in his mother; Hypertension in his mother.   Allergies No Known Allergies   Home Medications  Prior to Admission medications   Medication Sig Start Date End Date Taking? Authorizing Provider  methocarbamol (ROBAXIN) 500 MG tablet Take 1 tablet (500 mg total) by mouth 2 (two) times daily. 01/05/19  Yes Khatri, Nicanor Alcon, PA-C   CC APP time 25 minutes  Magdalen Spatz, MSN, AGACNP-BC [place] Pager # [phone] After 4 pm please call [phone]  01/14/2019, 7:40 AM   PCCM attending:  Patient with right 3 cm cerebellar mass and CT imaging  with 4.3 cm spiculated lung mass.  BP 126/83 (BP Location: Left Arm)   Pulse 65   Temp 97.8 F (36.6 C) (Oral)   Resp 18   Ht 6' (1.829 m)   Wt 72 kg   SpO2 99%   BMI 21.53 kg/m   General: Male, resting in bed no distress watching television Heart: Regular rate rhythm, S1-S2 Lungs: Bilateral breath sounds, no crackles no wheeze Abdomen: Soft nontender Extremities: No edema Neuro: Awake alert following commands no focal deficit  CT scan of the chest: Large left upper lobe spiculated lung mass.The patient's images  have been independently reviewed by me.    Assessment: Likely stage IV lung cancer Left upper lobe spiculated lung mass consistent with primary bronchogenic carcinoma. Longstanding history of tobacco abuse. Cerebellar metastasis Cerebral edema  Plan: Today we discussed the risk, benefits and alternatives of proceeding with inpatient bronchoscopy.  We will plan for video bronchoscopy with endobronchial navigation and biopsies of the large left upper lobe lesion.  We discussed the risk of bleeding as well as potential for pneumothorax. Patient is agreeable to these risks.  We will need to sign consent tomorrow in preop. Patient to be n.p.o. at midnight. Bronchoscopy planned for tomorrow morning.  Garner Nash, DO [facility] 01/14/2019 3:30 PM

## 2019-01-14 NOTE — Progress Notes (Addendum)
NAME:  Micheal Vincent, MRN:  [id number], DOB:  13-Aug-1960, LOS: 2 ADMISSION DATE:  01/12/2019, CONSULTATION DATE:  01/13/2019 REFERRING MD:  Dr. Tammi Klippel, Radiation Oncology, CHIEF COMPLAINT:  headache  Brief History   58 yo male smoker presented to ER with headache for few weeks.  CT head showed 3 cm Rt cerebellar mass and CT chest showed lung mass.  Pulmonary consulted to arrange for bronchoscopy and tissue sampling.  History of present illness   He was in hospital in September 2020 and had CT imaging.  Found to have Lt peritonsillar abscess, 4.3 cm spiculated mass in LUL.  He was seen by pulmonary.  Recommended to complete ABx and f/u with PET scan as outpt in 3 to 4 weeks.  Pt did not arrange f/u outpt appointment.  He has been getting pain in the back of his head.  Also getting dizzy and feeling like the room spins sometimes.  He came to ER.  CT imaging showed cerebellar mass and increased size of Lt upper lobe mass.    He denies fever, sweats, chills, weight loss, hoarseness, hemoptysis, chest pain, sputum, or gland swelling.  Past Medical History  Septic arthritis  Significant Hospital Events   11/24 Admit  Consults:  Neurosurgery  Procedures:  01/15/19 Bronch for tissue typing to determine treatment  Significant Diagnostic Tests:  CT chest 11/24 >> central necrotic LN in Lt hilum, 5.9 x 3.9 cm LUL mass with central necrosis MRI brain 11/25 >> 2.4 cm mass Rt superior cerebellum  Micro Data:  SARS CoV2 11/24 >> negative  Antimicrobials:  None  Interim history/subjective:  Awake and alert, appropriate, In NAD States he is ready to get bronch done 11/27  Objective   Blood pressure 117/81, pulse 71, temperature 98.1 F (36.7 C), temperature source Oral, resp. rate 18, height 6' (1.829 m), weight 72 kg, SpO2 96 %.        Intake/Output Summary (Last 24 hours) at 01/14/2019 0740 Last data filed at 01/14/2019 0500 Gross per 24 hour  Intake 815 ml  Output -  Net 815 ml    Filed Weights   01/12/19 1132 01/12/19 2056  Weight: 70.3 kg 72 kg    Physical Exam  Appearance - supine in bed,awake and alert and appropriate,  In NAD  ENMT - NCAT no sinus tenderness, no nasal discharge, no oral exudate, poor dentition  Neck - no masses, trachea midline, no thyromegaly, no elevation in JVP, No LAD  Respiratory - Bilateral chest excursion, normal appearance of chest wall, normal respiratory effort w/o accessory muscle use, no dullness on percussion,clear throughout,  no wheezing or rales  CV - s1s2 regular rate and rhythm, no murmurs, no peripheral edema, radial pulses symmetric  GI - soft, non tender, ND, BS +, Body mass index is 21.53 kg/m.   MSK - normal gait, normal muscle bulk and tone  Ext - No obvious deformities, no cyanosis, clubbing, or joint inflammation noted  Skin - no rashes, lesions, or ulcers, warm and dry  Neuro - normal strength, oriented x 3, alert and appropriate  Psych - normal mood and affect, asking appropriate questions    Assessment & Plan:   58 yo male smoker with enlarging Lt upper lobe mass with Lt hilar adenopathy and Rt cerebellar mass. - this is primary lung cancer until proven otherwise - for bronchoscopy with Dr. Leroy Sea Jo Cerone to be done on Friday 01/15/19 - procedure reviewed with pt, he is in agreement with proceeding - risks of  procedure detailed as bleeding, infection, pneumothorax, and non diagnosis   Rt cerebellar mass. - Neurosurgery consulted -  Tissue sampling per bronch>>solitary brain mass will be treated with stereotactic       radiosurgery (NSCLCA) or whole brain radiotherapy if small cell.   - radiation oncology assessing for XRT - Continue keppra, decadron per primary team and neurosurgery  Pt is aware of bronch scheduled for 11/27 .   Magdalen Spatz, MSN, AGACNP-BC [place] Pager # [phone] After 4 pm please call [phone]   CC APP Time 35 minutes   Best  practice:  Diet: regular DVT prophylaxis: Lovenox GI prophylaxis: Not indicated Mobility: OOB to chair Code Status: full code   Labs   CBC: Recent Labs  Lab 01/12/19 1457 01/13/19 0245  WBC 6.1 7.0  HGB 13.1 12.3*  HCT 40.4 37.0*  MCV 87.1 84.7  PLT 532* 537*    Basic Metabolic Panel: Recent Labs  Lab 01/12/19 1457 01/12/19 1950 01/13/19 0245  NA 138  --  135  K 4.0  --  4.5  CL 105  --  99  CO2 24  --  25  GLUCOSE 91  --  136*  BUN 8  --  12  CREATININE 0.84  --  0.84  CALCIUM 8.9  --  9.0  MG  --  2.5*  --   PHOS  --  3.1  --     Coagulation Profile: Recent Labs  Lab 01/12/19 2116  INR 1.3*    CBG: Recent Labs  Lab 01/13/19 0615 01/14/19 0609  GLUCAP 138* 165*    Review of Systems:   Reviewed and negative  Past Medical History  He,  has a past medical history of Septic arthritis ([place]).   Surgical History    Past Surgical History:  Procedure Laterality Date  . knee surgery       Social History   reports that he has been smoking cigarettes. He has been smoking about 1.50 packs per day. He has never used smokeless tobacco. He reports current alcohol use. He reports current drug use. Frequency: 6.00 times per week. Drugs: Cocaine and Marijuana.   Family History   His family history includes Cancer in his father and maternal aunt; Diabetes in his mother; Heart disease in his mother; Hypertension in his mother.   Allergies No Known Allergies   Home Medications  Prior to Admission medications   Medication Sig Start Date End Date Taking? Authorizing Provider  methocarbamol (ROBAXIN) 500 MG tablet Take 1 tablet (500 mg total) by mouth 2 (two) times daily. 01/05/19  Yes Khatri, Nicanor Alcon, PA-C   CC APP time 25 minutes  Magdalen Spatz, MSN, AGACNP-BC [place] Pager # [phone] After 4 pm please call [phone]  01/14/2019, 7:40 AM   PCCM attending:  Patient with right 3 cm cerebellar mass and CT imaging  with 4.3 cm spiculated lung mass.  BP 126/83 (BP Location: Left Arm)   Pulse 65   Temp 97.8 F (36.6 C) (Oral)   Resp 18   Ht 6' (1.829 m)   Wt 72 kg   SpO2 99%   BMI 21.53 kg/m   General: Male, resting in bed no distress watching television Heart: Regular rate rhythm, S1-S2 Lungs: Bilateral breath sounds, no crackles no wheeze Abdomen: Soft nontender Extremities: No edema Neuro: Awake alert following commands no focal deficit  CT scan of the chest: Large left upper lobe spiculated lung mass.The patient's images  have been independently reviewed by me.    Assessment: Likely stage IV lung cancer Left upper lobe spiculated lung mass consistent with primary bronchogenic carcinoma. Longstanding history of tobacco abuse. Cerebellar metastasis Cerebral edema  Plan: Today we discussed the risk, benefits and alternatives of proceeding with inpatient bronchoscopy.  We will plan for video bronchoscopy with endobronchial navigation and biopsies of the large left upper lobe lesion.  We discussed the risk of bleeding as well as potential for pneumothorax. Patient is agreeable to these risks.  We will need to sign consent tomorrow in preop. Patient to be n.p.o. at midnight. Bronchoscopy planned for tomorrow morning.  Garner Nash, DO [facility] 01/14/2019 3:30 PM

## 2019-01-14 NOTE — Progress Notes (Signed)
Patient ID: Micheal Vincent, male   DOB: August 18, 1960, 58 y.o.   MRN: [id number] Patient remains clinically stable.  Scheduled for lung biopsy tomorrow per patient.  We will continue to follow

## 2019-01-14 NOTE — Progress Notes (Signed)
PROGRESS NOTE    Micheal Vincent  VOH:[id number] DOB: [birth year] DOA: 01/12/2019 PCP: Patient, No Pcp Per    Brief Narrative:  58 year old male with history of recently discovered left upper lobe mass, cocaine, alcohol abuse presented to the emergency department with complaints of intractable headache.  Left upper lobe mass was discovered on CT chest in September 2020, no biopsy has been done.  Patient has been having headache, gait instability, numbness and tingling in his hands and feet for the last few weeks.  Patient also has been having chronic cough, night sweats, sporadic fevers and chills.  Patient continues to smoke 1 pack a day, has 40 pack year history of smoking.  Patient has been homeless, however has been in jail multiple times in the past.  Work-up in the emergency department with a CT head and neck demonstrated 3 cm right cerebellar mass with surrounding edema and mass-effect, 5 cm cavitary left upper lobe mass with associated mild mediastinal lymphadenopathy.  Lab work-up is unremarkable  Assessment & Plan:   Active Problems:   Cerebellar mass   ##Left cerebellar mass -Neurosurgery following -NS had recommended to obtain lung biopsy of the left upper lobe mass -Pt continued on Decadron  Left upper lobe mass -Highly concerning about stage IV malignancy -Get CT-guided biopsy of the lung -Pulmonary planning on bronchoscopy on 01/14/2019   Continued tobacco use -Keep the patient on nicotine patch -Cessation recommended  Homeless state -Complicating medical treatment -Social work consulted  DVT prophylaxis: Lovenox subcutaneous Code Status: Full Family Communication: Patient in room, family not at bedside Disposition Plan: Uncertain at this time  Consultants:   Neurosurgery  Pulmonary  Procedures:     Antimicrobials: Anti-infectives (From admission, onward)   None       Subjective: Feeling anxious about upcoming procedure  tomorrow  Objective: Vitals:   01/14/19 0353 01/14/19 0820 01/14/19 1142 01/14/19 1530  BP: 117/81 127/74 126/83 127/71  Pulse: 71 70 65 64  Resp: 18 18  18   Temp: 98.1 F (36.7 C) 98.7 F (37.1 C) 97.8 F (36.6 C) 98.7 F (37.1 C)  TempSrc: Oral Oral Oral Oral  SpO2: 96% 96% 99% 100%  Weight:      Height:        Intake/Output Summary (Last 24 hours) at 01/14/2019 1710 Last data filed at 01/14/2019 1300 Gross per 24 hour  Intake 960 ml  Output --  Net 960 ml   Filed Weights   01/12/19 1132 01/12/19 2056  Weight: 70.3 kg 72 kg    Examination:  General exam: Appears calm and comfortable  Respiratory system: Clear to auscultation. Respiratory effort normal. Cardiovascular system: S1 & S2 heard, ocular Gastrointestinal system: Abdomen is nondistended, soft and nontender. No organomegaly or masses felt. Normal bowel sounds heard. Central nervous system: Alert and oriented. No focal neurological deficits. Extremities: Symmetric 5 x 5 power. Skin: No rashes, lesions Psychiatry: Judgement and insight appear normal. Mood & affect appropriate.   Data Reviewed: I have personally reviewed following labs and imaging studies  CBC: Recent Labs  Lab 01/12/19 1457 01/13/19 0245  WBC 6.1 7.0  HGB 13.1 12.3*  HCT 40.4 37.0*  MCV 87.1 84.7  PLT 532* 694*   Basic Metabolic Panel: Recent Labs  Lab 01/12/19 1457 01/12/19 1950 01/13/19 0245  NA 138  --  135  K 4.0  --  4.5  CL 105  --  99  CO2 24  --  25  GLUCOSE 91  --  136*  BUN 8  --  12  CREATININE 0.84  --  0.84  CALCIUM 8.9  --  9.0  MG  --  2.5*  --   PHOS  --  3.1  --    GFR: Estimated Creatinine Clearance: 97.6 mL/min (by C-G formula based on SCr of 0.84 mg/dL). Liver Function Tests: No results for input(s): AST, ALT, ALKPHOS, BILITOT, PROT, ALBUMIN in the last 168 hours. No results for input(s): LIPASE, AMYLASE in the last 168 hours. No results for input(s): AMMONIA in the last 168 hours. Coagulation  Profile: Recent Labs  Lab 01/12/19 2116  INR 1.3*   Cardiac Enzymes: No results for input(s): CKTOTAL, CKMB, CKMBINDEX, TROPONINI in the last 168 hours. BNP (last 3 results) No results for input(s): PROBNP in the last 8760 hours. HbA1C: No results for input(s): HGBA1C in the last 72 hours. CBG: Recent Labs  Lab 01/13/19 0615 01/14/19 0609  GLUCAP 138* 165*   Lipid Profile: No results for input(s): CHOL, HDL, LDLCALC, TRIG, CHOLHDL, LDLDIRECT in the last 72 hours. Thyroid Function Tests: No results for input(s): TSH, T4TOTAL, FREET4, T3FREE, THYROIDAB in the last 72 hours. Anemia Panel: No results for input(s): VITAMINB12, FOLATE, FERRITIN, TIBC, IRON, RETICCTPCT in the last 72 hours. Sepsis Labs: No results for input(s): PROCALCITON, LATICACIDVEN in the last 168 hours.  Recent Results (from the past 240 hour(s))  SARS Coronavirus 2 by RT PCR (hospital order, performed in [facility] hospital lab) Nasopharyngeal Nasopharyngeal Swab     Status: None   Collection Time: 01/12/19  7:29 PM   Specimen: Nasopharyngeal Swab  Result Value Ref Range Status   SARS Coronavirus 2 NEGATIVE NEGATIVE Final    Comment: (NOTE) SARS-CoV-2 target nucleic acids are NOT DETECTED. The SARS-CoV-2 RNA is generally detectable in upper and lower respiratory specimens during the acute phase of infection. The lowest concentration of SARS-CoV-2 viral copies this assay can detect is 250 copies / mL. A negative result does not preclude SARS-CoV-2 infection and should not be used as the sole basis for treatment or other patient management decisions.  A negative result may occur with improper specimen collection / handling, submission of specimen other than nasopharyngeal swab, presence of viral mutation(s) within the areas targeted by this assay, and inadequate number of viral copies (<250 copies / mL). A negative result must be combined with clinical observations, patient history, and epidemiological  information. Fact Sheet for Patients:   StrictlyIdeas.no Fact Sheet for Healthcare Providers: BankingDealers.co.za This test is not yet approved or cleared  by the [place] FDA and has been authorized for detection and/or diagnosis of SARS-CoV-2 by FDA under an Emergency Use Authorization (EUA).  This EUA will remain in effect (meaning this test can be used) for the duration of the COVID-19 declaration under Section 564(b)(1) of the Act, 21 U.S.C. section 360bbb-3(b)(1), unless the authorization is terminated or revoked sooner. Performed at [facility], [place] [address] [place], [place] [address]      Radiology Studies: Ct Angio Head W/cm &/or Wo Cm  Result Date: 01/12/2019 CLINICAL DATA:  Rule out subarachnoid hemorrhage. Headache for several weeks. Lung mass. EXAM: CT ANGIOGRAPHY HEAD AND NECK TECHNIQUE: Multidetector CT imaging of the head and neck was performed using the standard protocol during bolus administration of intravenous contrast. Multiplanar CT image reconstructions and MIPs were obtained to evaluate the vascular anatomy. Carotid stenosis measurements (when applicable) are obtained utilizing NASCET criteria, using the distal internal carotid diameter as the denominator. CONTRAST:  46mL  OMNIPAQUE IOHEXOL 350 MG/ML SOLN COMPARISON:  CT head 01/05/2019 FINDINGS: CT HEAD FINDINGS Brain: Hypodensity right cerebellum with mild progression. This measures approximately 3 cm. There is progressive edema and mass-effect on the fourth ventricle. No hemorrhage. Ventricle size normal. No midline shift. Vascular: Negative for hyperdense vessel. Skull: Negative Sinuses: Negative Orbits: Negative Review of the MIP images confirms the above findings CTA NECK FINDINGS Aortic arch: Standard branching. Imaged portion shows no evidence of aneurysm or dissection. No significant stenosis of the major arch vessel origins. Right carotid system:  Right carotid widely patent. Mild atherosclerotic disease right carotid bulb. Left carotid system: Left carotid widely patent. Mild atherosclerotic disease left carotid bulb. Vertebral arteries: Both vertebral arteries are patent to the basilar without stenosis or irregularity Skeleton: No acute spinal abnormality. Poor dentition with extensive caries and periapical lucencies due to dental infections. Other neck: Negative for mass or adenopathy Upper chest: 5 cm left upper lobe mass with mediastinal adenopathy consistent with carcinoma of the lung. The mass and adenopathy have progressed since chest CT 11/11/2018 Review of the MIP images confirms the above findings CTA HEAD FINDINGS Anterior circulation: Cavernous carotid patent bilaterally without stenosis. Anterior and middle cerebral arteries patent bilaterally without stenosis or occlusion Posterior circulation: Both vertebral arteries patent to the basilar. PICA patent bilaterally. Basilar patent. Superior cerebellar and posterior cerebral arteries patent bilaterally without stenosis. Venous sinuses: Normal venous enhancement. Right cerebellar hypodensity does not show significant enhancement by CT however this is arterial phase scanning. No delayed imaging performed of the head. Review of the MIP images confirms the above findings IMPRESSION: 3 cm hypodensity right cerebellum appears larger with increased mass effect compared to the recent head CT of 01/05/2019. Given the left upper lobe lung mass, this is most likely metastatic disease. Recommend MRI brain without with contrast. No significant intracranial or extracranial arterial stenosis 5 cm left upper lobe mass with adenopathy, this has progressed since chest CT of 11/11/2018 These results were called by telephone at the time of interpretation on 01/12/2019 at 6:07 pm to provider Nanda Quinton , who verbally acknowledged these results. Electronically Signed   By: Franchot Gallo M.D.   On: 01/12/2019 18:07    Ct Angio Neck W And/or Wo Contrast  Result Date: 01/12/2019 CLINICAL DATA:  Rule out subarachnoid hemorrhage. Headache for several weeks. Lung mass. EXAM: CT ANGIOGRAPHY HEAD AND NECK TECHNIQUE: Multidetector CT imaging of the head and neck was performed using the standard protocol during bolus administration of intravenous contrast. Multiplanar CT image reconstructions and MIPs were obtained to evaluate the vascular anatomy. Carotid stenosis measurements (when applicable) are obtained utilizing NASCET criteria, using the distal internal carotid diameter as the denominator. CONTRAST:  45mL OMNIPAQUE IOHEXOL 350 MG/ML SOLN COMPARISON:  CT head 01/05/2019 FINDINGS: CT HEAD FINDINGS Brain: Hypodensity right cerebellum with mild progression. This measures approximately 3 cm. There is progressive edema and mass-effect on the fourth ventricle. No hemorrhage. Ventricle size normal. No midline shift. Vascular: Negative for hyperdense vessel. Skull: Negative Sinuses: Negative Orbits: Negative Review of the MIP images confirms the above findings CTA NECK FINDINGS Aortic arch: Standard branching. Imaged portion shows no evidence of aneurysm or dissection. No significant stenosis of the major arch vessel origins. Right carotid system: Right carotid widely patent. Mild atherosclerotic disease right carotid bulb. Left carotid system: Left carotid widely patent. Mild atherosclerotic disease left carotid bulb. Vertebral arteries: Both vertebral arteries are patent to the basilar without stenosis or irregularity Skeleton: No acute spinal abnormality. Poor dentition  with extensive caries and periapical lucencies due to dental infections. Other neck: Negative for mass or adenopathy Upper chest: 5 cm left upper lobe mass with mediastinal adenopathy consistent with carcinoma of the lung. The mass and adenopathy have progressed since chest CT 11/11/2018 Review of the MIP images confirms the above findings CTA HEAD FINDINGS  Anterior circulation: Cavernous carotid patent bilaterally without stenosis. Anterior and middle cerebral arteries patent bilaterally without stenosis or occlusion Posterior circulation: Both vertebral arteries patent to the basilar. PICA patent bilaterally. Basilar patent. Superior cerebellar and posterior cerebral arteries patent bilaterally without stenosis. Venous sinuses: Normal venous enhancement. Right cerebellar hypodensity does not show significant enhancement by CT however this is arterial phase scanning. No delayed imaging performed of the head. Review of the MIP images confirms the above findings IMPRESSION: 3 cm hypodensity right cerebellum appears larger with increased mass effect compared to the recent head CT of 01/05/2019. Given the left upper lobe lung mass, this is most likely metastatic disease. Recommend MRI brain without with contrast. No significant intracranial or extracranial arterial stenosis 5 cm left upper lobe mass with adenopathy, this has progressed since chest CT of 11/11/2018 These results were called by telephone at the time of interpretation on 01/12/2019 at 6:07 pm to provider Nanda Quinton , who verbally acknowledged these results. Electronically Signed   By: Franchot Gallo M.D.   On: 01/12/2019 18:07   Ct Chest W Contrast  Result Date: 01/12/2019 CLINICAL DATA:  Known left upper lobe lung mass EXAM: CT CHEST, ABDOMEN, AND PELVIS WITH CONTRAST TECHNIQUE: Multidetector CT imaging of the chest, abdomen and pelvis was performed following the standard protocol during bolus administration of intravenous contrast. CONTRAST:  128mL OMNIPAQUE IOHEXOL 300 MG/ML  SOLN COMPARISON:  11/11/2018 CT chest, 11/12/2018 CT abdomen and pelvis and 11/13/2018 MRI of the abdomen. FINDINGS: CT CHEST FINDINGS Cardiovascular: Thoracic aorta and its branches are within normal limits. No cardiac enlargement is seen. No pulmonary emboli are seen although opacification is poor of the pulmonary artery. No  coronary calcifications are seen. No pericardial effusion is noted. Mediastinum/Nodes: Thoracic inlet is not well visualized due to a technical abnormality within the imaging plane. No mediastinal adenopathy is noted. There is a centrally necrotic lymph node identified in the left hilum measuring 13.6 mm in short axis. The esophagus as visualized is unremarkable. Lungs/Pleura: The right lung demonstrates no focal abnormality. The left lung again demonstrates the previously seen mass lesion with displacement of the bronchial tree. This lies along the major fissure and now measures approximately 5.9 x 3.9 cm increased from the prior exam at which time it measured 4.1 x 2.9 cm. Additionally deforms the major fissure more than that seen on the prior exam consistent with the focal mass effect. Additionally there is extension of the lesion through the major fissure into the superior segment of the left lower lobe best seen on image number 26 of series 4. Some increasing interstitial thickening is noted adjacent to the mass lesion likely representing some lymphangitic spread of tumor. Additionally significant central necrosis is now seen. Musculoskeletal: No rib lesions are noted although the superior aspect of the chest again is not visualized on this exam. Mild degenerative changes of the thoracic spine are seen. No acute bony lesion is noted. CT ABDOMEN PELVIS FINDINGS Hepatobiliary: The liver again demonstrates scattered hypodensities some which demonstrate peripheral enhancement consistent with hemangiomas. These are stable from the prior CT examination. Gallbladder is incompletely distended. Some pericholecystic fluid attenuation is noted stable from  the prior exam. This was well visualized on prior CT and MR and felt to be related to adenomyomatosis. Pancreas: Unremarkable. No pancreatic ductal dilatation or surrounding inflammatory changes. Spleen: Normal in size without focal abnormality. Adrenals/Urinary Tract:  Adrenal glands are within normal limits. The kidneys again demonstrate a normal enhancement pattern. Scattered small cysts are seen bilaterally stable from the prior CT. No ureteral calculi are seen. The bladder is well distended with contrast enhanced urine. Stomach/Bowel: The colon is well visualized without obstructive or inflammatory changes. The appendix is well visualized. No small bowel or gastric abnormality is seen. Vascular/Lymphatic: No significant lymphadenopathy is noted. Scattered atherosclerotic changes of the aorta and iliac vessels are seen. Reproductive: Prostate is unremarkable. Other: No abdominal wall hernia or abnormality. No abdominopelvic ascites. Musculoskeletal: No acute or significant osseous findings. IMPRESSION: Enlarging left upper lobe mass lesion with significant increased central necrosis consistent with enlarging pulmonary neoplasm. There is now extension of the lesion across the major fissure into the superior segment of the left as well as changes consistent with lymphangitic spread of carcinoma. Central left hilar adenopathy with necrosis is seen new from the prior exam. The dominant node measures 13.6 mm within the left hilum and is new from the prior exam. These changes correspond with that seen on recent MRI and CT suspicious for metastatic disease. No intra-abdominal metastatic disease is noted. Chronic changes are noted within the kidneys and liver as well as the gallbladder similar to that seen on prior CT and MRI examination. Electronically Signed   By: Inez Catalina M.D.   On: 01/12/2019 20:55   Mr Jeri Cos And Wo Contrast  Result Date: 01/13/2019 CLINICAL DATA:  58 year old male with persistent headache. Progressive left upper lung mass, abnormal hypodensity in the right cerebellum on CT. EXAM: MRI HEAD WITHOUT AND WITH CONTRAST TECHNIQUE: Multiplanar, multiecho pulse sequences of the brain and surrounding structures were obtained without and with intravenous  contrast. CONTRAST:  7.59mL GADAVIST GADOBUTROL 1 MMOL/ML IV SOLN COMPARISON:  CTA head and neck 01/12/2019, CT head 01/05/2019. FINDINGS: Brain: Approximately 24 millimeter mostly rim enhancing mass located in the right superior cerebellum corresponds to the hypodense lesion by CT. This has spiculated margins (series 10, image 35) and is superficially located, raising the possibility of some leptomeningeal involvement. Confluent cerebellar edema which crosses midline (series 5, image 17). Subsequent partially compressed 4th ventricle. Although the lateral and 3rd ventricles remain normal without transependymal edema. Subsequent crowding of the posterior fossa with mild effacement of the cisterna magna. No overt tonsillar herniation at this time. No other enhancing brain mass or abnormal intracranial enhancement identified. No pachymeningeal thickening. The enhancing mass is visible on diffusion weighted imaging, and additionally there is a punctate area of abnormal trace diffusion in the left anterior frontal lobe white matter on series 2, image 39. This might be restricted on ADC (series 250 image 39) but there is no associated enhancement, and no discrete T2 or FLAIR correlate. No other abnormal diffusion. Minimal nonspecific cerebral white matter T2 and FLAIR hyperintensity elsewhere. No cortical encephalomalacia or chronic cerebral blood products. Suprasellar and interpeduncular cisterns remain normal. No superimposed midline shift, extra-axial collection or acute intracranial hemorrhage. Pituitary within normal limits. Vascular: Major intracranial vascular flow voids are preserved. The major dural venous sinuses are enhancing and appear to be patent. Skull and upper cervical spine: Negative visible cervical spine and spinal cord. Visualized bone marrow signal is within normal limits. Sinuses/Orbits: Negative orbits. Trace paranasal sinus mucosal thickening. Other: Mastoids  are clear. Visible internal auditory  structures appear normal. Benign appearing right lateral face sebaceous cyst or similar lesion. Otherwise negative visible scalp and face soft tissues. IMPRESSION: 1. Solitary 24 mm enhancing mass in the right superior cerebellum most compatible with a solitary brain metastasis in this setting. Superficial position and spiculated margins such that early leptomeningeal involvement is not excluded. Confluent cerebellar edema which crosses midline. Mass effect in the posterior fossa, including compression of the 4th ventricle and crowding of the cisterna magna, but no ventriculomegaly or transependymal edema. 2. Superimposed punctate diffusion abnormality in the left anterior frontal lobe white matter with no associated enhancement, T2 or FLAIR signal. This is nonspecific at this time, but punctate lacunar infarct is currently favored over early metastasis. Attention directed on follow-up. 3. No other metastatic disease or acute intracranial abnormality identified. Electronically Signed   By: Genevie Ann M.D.   On: 01/13/2019 04:35   Ct Abdomen Pelvis W Contrast  Result Date: 01/12/2019 CLINICAL DATA:  Known left upper lobe lung mass EXAM: CT CHEST, ABDOMEN, AND PELVIS WITH CONTRAST TECHNIQUE: Multidetector CT imaging of the chest, abdomen and pelvis was performed following the standard protocol during bolus administration of intravenous contrast. CONTRAST:  122mL OMNIPAQUE IOHEXOL 300 MG/ML  SOLN COMPARISON:  11/11/2018 CT chest, 11/12/2018 CT abdomen and pelvis and 11/13/2018 MRI of the abdomen. FINDINGS: CT CHEST FINDINGS Cardiovascular: Thoracic aorta and its branches are within normal limits. No cardiac enlargement is seen. No pulmonary emboli are seen although opacification is poor of the pulmonary artery. No coronary calcifications are seen. No pericardial effusion is noted. Mediastinum/Nodes: Thoracic inlet is not well visualized due to a technical abnormality within the imaging plane. No mediastinal  adenopathy is noted. There is a centrally necrotic lymph node identified in the left hilum measuring 13.6 mm in short axis. The esophagus as visualized is unremarkable. Lungs/Pleura: The right lung demonstrates no focal abnormality. The left lung again demonstrates the previously seen mass lesion with displacement of the bronchial tree. This lies along the major fissure and now measures approximately 5.9 x 3.9 cm increased from the prior exam at which time it measured 4.1 x 2.9 cm. Additionally deforms the major fissure more than that seen on the prior exam consistent with the focal mass effect. Additionally there is extension of the lesion through the major fissure into the superior segment of the left lower lobe best seen on image number 26 of series 4. Some increasing interstitial thickening is noted adjacent to the mass lesion likely representing some lymphangitic spread of tumor. Additionally significant central necrosis is now seen. Musculoskeletal: No rib lesions are noted although the superior aspect of the chest again is not visualized on this exam. Mild degenerative changes of the thoracic spine are seen. No acute bony lesion is noted. CT ABDOMEN PELVIS FINDINGS Hepatobiliary: The liver again demonstrates scattered hypodensities some which demonstrate peripheral enhancement consistent with hemangiomas. These are stable from the prior CT examination. Gallbladder is incompletely distended. Some pericholecystic fluid attenuation is noted stable from the prior exam. This was well visualized on prior CT and MR and felt to be related to adenomyomatosis. Pancreas: Unremarkable. No pancreatic ductal dilatation or surrounding inflammatory changes. Spleen: Normal in size without focal abnormality. Adrenals/Urinary Tract: Adrenal glands are within normal limits. The kidneys again demonstrate a normal enhancement pattern. Scattered small cysts are seen bilaterally stable from the prior CT. No ureteral calculi are  seen. The bladder is well distended with contrast enhanced urine. Stomach/Bowel: The colon  is well visualized without obstructive or inflammatory changes. The appendix is well visualized. No small bowel or gastric abnormality is seen. Vascular/Lymphatic: No significant lymphadenopathy is noted. Scattered atherosclerotic changes of the aorta and iliac vessels are seen. Reproductive: Prostate is unremarkable. Other: No abdominal wall hernia or abnormality. No abdominopelvic ascites. Musculoskeletal: No acute or significant osseous findings. IMPRESSION: Enlarging left upper lobe mass lesion with significant increased central necrosis consistent with enlarging pulmonary neoplasm. There is now extension of the lesion across the major fissure into the superior segment of the left as well as changes consistent with lymphangitic spread of carcinoma. Central left hilar adenopathy with necrosis is seen new from the prior exam. The dominant node measures 13.6 mm within the left hilum and is new from the prior exam. These changes correspond with that seen on recent MRI and CT suspicious for metastatic disease. No intra-abdominal metastatic disease is noted. Chronic changes are noted within the kidneys and liver as well as the gallbladder similar to that seen on prior CT and MRI examination. Electronically Signed   By: Inez Catalina M.D.   On: 01/12/2019 20:55    Scheduled Meds:  dexamethasone (DECADRON) injection  4 mg Intravenous Q6H   docusate sodium  100 mg Oral BID   enoxaparin (LOVENOX) injection  40 mg Subcutaneous L24M   folic acid  1 mg Oral Daily   levETIRAcetam  500 mg Oral BID   multivitamin with minerals  1 tablet Oral Daily   senna  1 tablet Oral BID   sodium chloride flush  3 mL Intravenous Q12H   thiamine  100 mg Oral Daily   Continuous Infusions:   LOS: 2 days   Marylu Lund, MD [facility] Pager On Amion  If 7PM-7AM, please contact night-coverage 01/14/2019, 5:10 PM

## 2019-01-15 ENCOUNTER — Encounter (HOSPITAL_COMMUNITY)
Admission: EM | Disposition: A | Discharge status: Home / Self Care | Payer: Self-pay | Source: Home / Self Care | Attending: Internal Medicine

## 2019-01-15 ENCOUNTER — Inpatient Hospital Stay (HOSPITAL_COMMUNITY): Payer: Medicaid Other

## 2019-01-15 ENCOUNTER — Inpatient Hospital Stay (HOSPITAL_COMMUNITY): Payer: Medicaid Other | Admitting: Anesthesiology

## 2019-01-15 ENCOUNTER — Other Ambulatory Visit: Payer: Self-pay

## 2019-01-15 ENCOUNTER — Encounter (HOSPITAL_COMMUNITY): Payer: Self-pay | Admitting: Anesthesiology

## 2019-01-15 DIAGNOSIS — R918 Other nonspecific abnormal finding of lung field: Secondary | ICD-10-CM

## 2019-01-15 HISTORY — PX: VIDEO BRONCHOSCOPY WITH ENDOBRONCHIAL NAVIGATION: SHX6175

## 2019-01-15 LAB — GLUCOSE, CAPILLARY
Glucose-Capillary: 110 mg/dL — ABNORMAL HIGH (ref 70–99)
Glucose-Capillary: 106 mg/dL — ABNORMAL HIGH (ref 70–99)

## 2019-01-15 LAB — CBC
HCT: 36.6 % — ABNORMAL LOW (ref 39.0–52.0)
Hemoglobin: 11.9 g/dL — ABNORMAL LOW (ref 13.0–17.0)
MCH: 27.7 pg (ref 26.0–34.0)
MCHC: 32.5 g/dL (ref 30.0–36.0)
MCV: 85.1 fL (ref 80.0–100.0)
Platelets: 617 10*3/uL — ABNORMAL HIGH (ref 150–400)
RBC: 4.3 MIL/uL (ref 4.22–5.81)
RDW: 13.2 % (ref 11.5–15.5)
WBC: 12.5 10*3/uL — ABNORMAL HIGH (ref 4.0–10.5)
nRBC: 0 % (ref 0.0–0.2)

## 2019-01-15 SURGERY — VIDEO BRONCHOSCOPY WITH ENDOBRONCHIAL NAVIGATION
Anesthesia: General

## 2019-01-15 MED ORDER — SUGAMMADEX SODIUM 200 MG/2ML IV SOLN
INTRAVENOUS | Status: DC | PRN
  Administered 2019-01-15: 200 mg via INTRAVENOUS

## 2019-01-15 MED ORDER — DEXAMETHASONE SODIUM PHOSPHATE 10 MG/ML IJ SOLN
INTRAMUSCULAR | Status: DC | PRN
  Administered 2019-01-15: 10 mg via INTRAVENOUS

## 2019-01-15 MED ORDER — ROCURONIUM BROMIDE 50 MG/5ML IV SOSY
PREFILLED_SYRINGE | INTRAVENOUS | Status: DC | PRN
  Administered 2019-01-15: 50 mg via INTRAVENOUS
  Administered 2019-01-15: 20 mg via INTRAVENOUS

## 2019-01-15 MED ORDER — MIDAZOLAM HCL 5 MG/5ML IJ SOLN
INTRAMUSCULAR | Status: DC | PRN
  Administered 2019-01-15: 2 mg via INTRAVENOUS

## 2019-01-15 MED ORDER — KETOROLAC TROMETHAMINE 30 MG/ML IJ SOLN
INTRAMUSCULAR | Status: AC
  Filled 2019-01-15: qty 1

## 2019-01-15 MED ORDER — DEXAMETHASONE SODIUM PHOSPHATE 10 MG/ML IJ SOLN
INTRAMUSCULAR | Status: AC
  Filled 2019-01-15: qty 1

## 2019-01-15 MED ORDER — ONDANSETRON HCL 4 MG/2ML IJ SOLN
INTRAMUSCULAR | Status: AC
  Filled 2019-01-15: qty 2

## 2019-01-15 MED ORDER — PROPOFOL 10 MG/ML IV BOLUS
INTRAVENOUS | Status: AC
  Filled 2019-01-15: qty 40

## 2019-01-15 MED ORDER — KETOROLAC TROMETHAMINE 30 MG/ML IJ SOLN: 30.0000 mg | Freq: Once | INTRAMUSCULAR | Status: DC | PRN

## 2019-01-15 MED ORDER — PHENYLEPHRINE HCL-NACL 10-0.9 MG/250ML-% IV SOLN
INTRAVENOUS | Status: DC | PRN
  Administered 2019-01-15: 25 ug/min via INTRAVENOUS

## 2019-01-15 MED ORDER — FENTANYL CITRATE (PF) 100 MCG/2ML IJ SOLN
INTRAMUSCULAR | Status: DC | PRN
  Administered 2019-01-15: 50 ug via INTRAVENOUS

## 2019-01-15 MED ORDER — FENTANYL CITRATE (PF) 250 MCG/5ML IJ SOLN
INTRAMUSCULAR | Status: AC
  Filled 2019-01-15: qty 5

## 2019-01-15 MED ORDER — OXYCODONE HCL 5 MG PO TABS: 5.0000 mg | ORAL_TABLET | Freq: Once | ORAL | Status: DC | PRN

## 2019-01-15 MED ORDER — MIDAZOLAM HCL 2 MG/2ML IJ SOLN
INTRAMUSCULAR | Status: AC
  Filled 2019-01-15: qty 2

## 2019-01-15 MED ORDER — LIDOCAINE 2% (20 MG/ML) 5 ML SYRINGE
INTRAMUSCULAR | Status: DC | PRN
  Administered 2019-01-15: 100 mg via INTRAVENOUS

## 2019-01-15 MED ORDER — LACTATED RINGERS IV SOLN
INTRAVENOUS | Status: DC
  Administered 2019-01-15: 10:00:00 via INTRAVENOUS

## 2019-01-15 MED ORDER — ROCURONIUM BROMIDE 10 MG/ML (PF) SYRINGE
PREFILLED_SYRINGE | INTRAVENOUS | Status: AC
  Filled 2019-01-15: qty 10

## 2019-01-15 MED ORDER — PROPOFOL 10 MG/ML IV BOLUS
INTRAVENOUS | Status: DC | PRN
  Administered 2019-01-15: 150 mg via INTRAVENOUS

## 2019-01-15 MED ORDER — PROMETHAZINE HCL 25 MG/ML IJ SOLN: 6.2500 mg | INTRAMUSCULAR | Status: DC | PRN

## 2019-01-15 MED ORDER — LIDOCAINE 2% (20 MG/ML) 5 ML SYRINGE
INTRAMUSCULAR | Status: AC
  Filled 2019-01-15: qty 5

## 2019-01-15 MED ORDER — OXYCODONE HCL 5 MG/5ML PO SOLN: 5.0000 mg | Freq: Once | ORAL | Status: DC | PRN

## 2019-01-15 MED ORDER — HYDROMORPHONE HCL 1 MG/ML IJ SOLN: 0.2500 mg | INTRAMUSCULAR | Status: DC | PRN

## 2019-01-15 MED ORDER — ACETAMINOPHEN 500 MG PO TABS
1000.0000 mg | ORAL_TABLET | Freq: Once | ORAL | Status: AC
  Administered 2019-01-15: 1000 mg via ORAL
  Filled 2019-01-15: qty 2

## 2019-01-15 MED ORDER — MEPERIDINE HCL 25 MG/ML IJ SOLN: 6.2500 mg | INTRAMUSCULAR | Status: DC | PRN

## 2019-01-15 MED ORDER — 0.9 % SODIUM CHLORIDE (POUR BTL) OPTIME
TOPICAL | Status: DC | PRN
  Administered 2019-01-15: 1000 mL

## 2019-01-15 MED ORDER — ONDANSETRON HCL 4 MG/2ML IJ SOLN
INTRAMUSCULAR | Status: DC | PRN
  Administered 2019-01-15: 4 mg via INTRAVENOUS

## 2019-01-15 SURGICAL SUPPLY — 54 items
ADAPTER BRONCHOSCOPE OLYMP 190 (ADAPTER) ×2 IMPLANT
ADAPTER BRONCHOSCOPE OLYMPUS (ADAPTER) ×3 IMPLANT
ADAPTER VALVE BIOPSY EBUS (MISCELLANEOUS) IMPLANT
ADPTR VALVE BIOPSY EBUS (MISCELLANEOUS)
BALLN FOR EBUS SCOPE (BALLOONS) ×3
BALLOON FOR EBUS SCOPE (BALLOONS) IMPLANT
BRUSH CYTOL CELLEBRITY 1.5X140 (MISCELLANEOUS) ×3 IMPLANT
BRUSH SUPERTRAX BIOPSY (INSTRUMENTS) IMPLANT
BRUSH SUPERTRAX NDL-TIP CYTO (INSTRUMENTS) ×3 IMPLANT
CANISTER SUCT 3000ML PPV (MISCELLANEOUS) ×3 IMPLANT
CONT SPEC 4OZ CLIKSEAL STRL BL (MISCELLANEOUS) ×3 IMPLANT
COVER BACK TABLE 60X90IN (DRAPES) ×3 IMPLANT
COVER WAND RF STERILE (DRAPES) ×1 IMPLANT
FILTER STRAW FLUID ASPIR (MISCELLANEOUS) IMPLANT
FORCEPS BIOP RJ4 1.8 (CUTTING FORCEPS) IMPLANT
FORCEPS BIOP SUPERTRX PREMAR (INSTRUMENTS) ×3 IMPLANT
GAUZE SPONGE 4X4 12PLY STRL (GAUZE/BANDAGES/DRESSINGS) ×3 IMPLANT
GLOVE SURG SS PI 7.5 STRL IVOR (GLOVE) ×6 IMPLANT
GOWN STRL REUS W/ TWL LRG LVL3 (GOWN DISPOSABLE) ×2 IMPLANT
GOWN STRL REUS W/TWL LRG LVL3 (GOWN DISPOSABLE) ×4
KIT CLEAN ENDO COMPLIANCE (KITS) ×6 IMPLANT
KIT ILLUMISITE 180 PROCEDURE (KITS) IMPLANT
KIT ILLUMISITE 90 PROCEDURE (KITS) ×2 IMPLANT
KIT LOCATABLE GUIDE (CANNULA) IMPLANT
KIT MARKER FIDUCIAL DELIVERY (KITS) IMPLANT
KIT TURNOVER KIT B (KITS) ×3 IMPLANT
MARKER SKIN DUAL TIP RULER LAB (MISCELLANEOUS) ×3 IMPLANT
NDL ASPIRATION VIZISHOT 19G (NEEDLE) IMPLANT
NDL ASPIRATION VIZISHOT 21G (NEEDLE) IMPLANT
NDL SUPERTRX PREMARK BIOPSY (NEEDLE) ×1 IMPLANT
NEEDLE ASPIRATION VIZISHOT 19G (NEEDLE) IMPLANT
NEEDLE ASPIRATION VIZISHOT 21G (NEEDLE) IMPLANT
NEEDLE SUPERTRX PREMARK BIOPSY (NEEDLE) ×3 IMPLANT
NS IRRIG 1000ML POUR BTL (IV SOLUTION) ×3 IMPLANT
OIL SILICONE PENTAX (PARTS (SERVICE/REPAIRS)) ×3 IMPLANT
PAD ARMBOARD 7.5X6 YLW CONV (MISCELLANEOUS) ×6 IMPLANT
PATCHES PATIENT (LABEL) ×9 IMPLANT
STOPCOCK 4 WAY LG BORE MALE ST (IV SETS) ×3 IMPLANT
SYR 20ML ECCENTRIC (SYRINGE) ×9 IMPLANT
SYR 20ML LL LF (SYRINGE) ×3 IMPLANT
SYR 3ML LL SCALE MARK (SYRINGE) IMPLANT
SYR 50ML SLIP (SYRINGE) ×3 IMPLANT
SYR 5ML LL (SYRINGE) ×12 IMPLANT
SYSTEM GENCUT CORE BIOPSY (NEEDLE) ×2 IMPLANT
TOWEL GREEN STERILE FF (TOWEL DISPOSABLE) ×3 IMPLANT
TRAP SPECIMEN MUCOUS 40CC (MISCELLANEOUS) IMPLANT
TUBE CONNECTING 20'X1/4 (TUBING) ×1
TUBE CONNECTING 20X1/4 (TUBING) ×2 IMPLANT
TUBING EXTENTION W/L.L. (IV SETS) ×3 IMPLANT
UNDERPAD 30X30 (UNDERPADS AND DIAPERS) ×3 IMPLANT
VALVE BIOPSY  SINGLE USE (MISCELLANEOUS) ×2
VALVE BIOPSY SINGLE USE (MISCELLANEOUS) ×1 IMPLANT
VALVE SUCTION BRONCHIO DISP (MISCELLANEOUS) ×3 IMPLANT
WATER STERILE IRR 1000ML POUR (IV SOLUTION) ×3 IMPLANT

## 2019-01-15 NOTE — Transfer of Care (Signed)
Immediate Anesthesia Transfer of Care Note  Patient: Micheal Vincent  Procedure(s) Performed: VIDEO BRONCHOSCOPY WITH ENDOBRONCHIAL NAVIGATION (N/A )  Patient Location: PACU  Anesthesia Type:General  Level of Consciousness: awake, alert , oriented and sedated  Airway & Oxygen Therapy: Patient Spontanous Breathing and Patient connected to face mask oxygen  Post-op Assessment: Report given to RN, Post -op Vital signs reviewed and stable and Patient moving all extremities  Post vital signs: Reviewed  Last Vitals:  Vitals Value Taken Time  BP 135/82 01/15/19 1139  Temp 36.4 C 01/15/19 1138  Pulse 77 01/15/19 1142  Resp 16 01/15/19 1142  SpO2 97 % 01/15/19 1142  Vitals shown include unvalidated device data.  Last Pain:  Vitals:   01/15/19 1138  TempSrc:   PainSc: 0-No pain      Patients Stated Pain Goal: Other (Comment)(RN will look over admission orders and medicate apropriately) (44/81/[id number])  Complications: No apparent anesthesia complications

## 2019-01-15 NOTE — Op Note (Signed)
Video Bronchoscopy with Electromagnetic Navigation Procedure Note  Date of Operation: 01/15/2019  Pre-op Diagnosis: Left upper lobe lung mass  Post-op Diagnosis: Left upper lobe lung mass  Surgeon: Garner Nash, DO   Assistants: None  Anesthesia: General endotracheal anesthesia  Operation: Flexible video fiberoptic bronchoscopy with electromagnetic navigation and biopsies.  Estimated Blood Loss: Minimal, <3MH  Complications: None   Indications and History: Micheal Vincent is a 59 y.o. male with left upper lobe lung mass.  The risks, benefits, complications, treatment options and expected outcomes were discussed with the patient.  The possibilities of pneumothorax, pneumonia, reaction to medication, pulmonary aspiration, perforation of a viscus, bleeding, failure to diagnose a condition and creating a complication requiring transfusion or operation were discussed with the patient who freely signed the consent.    Description of Procedure: The patient was seen in the Preoperative Area, was examined and was deemed appropriate to proceed.  The patient was taken to [facility] R 10, identified as Micheal Vincent and the procedure verified as Flexible Video Fiberoptic Bronchoscopy.  A Time Out was held and the above information confirmed.   Prior to the date of the procedure a high-resolution CT scan of the chest was performed. Utilizing [place] a virtual tracheobronchial tree was generated to allow the creation of distinct navigation pathways to the patient's parenchymal abnormalities. After being taken to the operating room general anesthesia was initiated and the patient  was orally intubated. The video fiberoptic bronchoscope was introduced via the endotracheal tube and a general inspection was performed which showed normal right and left lung anatomy areas of anthracosis and bronchial pitting consistent with tobacco abuse. The extendable working channel and locator guide were introduced  into the bronchoscope. The distinct navigation pathways prepared prior to this procedure were then utilized to navigate to within 1 cm of patient's lesion(s) identified on CT scan. The extendable working channel was secured into place and the locator guide was withdrawn. Under fluoroscopic guidance transbronchial needle brushings, transbronchial needle biopsies, and transbronchial forceps biopsies were performed to be sent for cytology and pathology. A bronchioalveolar lavage was performed in the left upper lobe and sent for cytology. At the end of the procedure a general airway inspection was performed and there was no evidence of active bleeding. The bronchoscope was removed.  The patient tolerated the procedure well. There was no significant blood loss and there were no obvious complications. A post-procedural chest x-ray is pending.  Samples: 1. Transbronchial needle brushings from left upper lobe 2. Transbronchial needle biopsies from left upper lobe 3. Transbronchial forceps biopsies from left upper lobe 4. Bronchoalveolar lavage from left upper lung  Plans:  The patient will be discharged from the PACU to home when recovered from anesthesia and after chest x-ray is reviewed. We will review the cytology, pathology and microbiology results with the patient when they become available. Outpatient followup will be with Octavio Graves Babe Anthis, DO.  Preliminary pathology: Adequate for tissue diagnosis.  We appreciate the consultation.  Garner Nash, DO [facility] 01/15/2019 11:41 AM

## 2019-01-15 NOTE — Anesthesia Procedure Notes (Signed)
Procedure Name: Intubation Date/Time: 01/15/2019 10:14 AM Performed by: Scheryl Darter, CRNA Pre-anesthesia Checklist: Patient identified, Emergency Drugs available, Suction available and Patient being monitored Patient Re-evaluated:Patient Re-evaluated prior to induction Oxygen Delivery Method: Circle System Utilized Preoxygenation: Pre-oxygenation with 100% oxygen Induction Type: IV induction Ventilation: Mask ventilation without difficulty Grade View: Grade I Tube type: Oral Tube size: 8.5 mm Number of attempts: 1 Airway Equipment and Method: Stylet and Oral airway Placement Confirmation: ETT inserted through vocal cords under direct vision,  positive ETCO2 and breath sounds checked- equal and bilateral Secured at: 23 cm Tube secured with: Tape Dental Injury: Teeth and Oropharynx as per pre-operative assessment  Comments: Poor dentition/same as pre-op post intubation/none loose/many missing

## 2019-01-15 NOTE — Interval H&P Note (Signed)
History and Physical Interval Note:  01/15/2019 10:02 AM  Micheal Vincent  has presented today for surgery, with the diagnosis of LUNG MASS.  The various methods of treatment have been discussed with the patient and family. After consideration of risks, benefits and other options for treatment, the patient has consented to  Procedure(s): VIDEO BRONCHOSCOPY WITH ENDOBRONCHIAL ULTRASOUND (N/A) VIDEO BRONCHOSCOPY WITH ENDOBRONCHIAL NAVIGATION (N/A) as a surgical intervention.  The patient's history has been reviewed, patient examined, no change in status, stable for surgery.  I have reviewed the patient's chart and labs.  Questions were answered to the patient's satisfaction.    Patient seen in pre-op. All questions answered. No complaints this AM. No barriers to proceed. Discussed risks of bleeding and pneumothorax.   [place]

## 2019-01-15 NOTE — Progress Notes (Signed)
PROGRESS NOTE  Micheal Vincent [id number]:[id number] DOB: 1961/01/21 DOA: 01/12/2019 PCP: Patient, No Pcp Per  Brief History   58 year old male with history of recently discovered left upper lobe mass, cocaine, alcohol abuse presented to the emergency department with complaints of intractable headache. Left upper lobe mass was discovered on CT chest in September 2020, no biopsy has been done. Patient has been having headache, gait instability, numbness and tingling in his hands and feet for the last few weeks. Patient also has been having chronic cough, night sweats, sporadic fevers and chills. Patient continues to smoke 1 pack a day, has 40 pack year history of smoking. Patient has been homeless, however has been in jail multiple times in the past. Work-up in the emergency department with a CT head and neck demonstrated 3 cm right cerebellar mass with surrounding edema and mass-effect, 5 cm cavitary left upper lobe mass with associated mild mediastinal lymphadenopathy. Lab work-up is unremarkable  The patient has been admitted to a telemetry bed. Neurosurgery has been consulted as has PCCM. The patient underwent bronchoscopy today for biosy and brushing of lung lesion. He has tolerated the procedure well.  Consultants  . Neurosurgery . PCCM  Procedures  . Bronchoscopy with brushings and biopsy.  Antibiotics   Anti-infectives (From admission, onward)   None    .  Subjective  The patient is sitting up in bed with family at bedside. No new complaints.  Objective   Vitals:  Vitals:   01/15/19 1201 01/15/19 1246  BP:  136/84  Pulse: 67 62  Resp: 19 18  Temp: 97.9 F (36.6 C) 97.6 F (36.4 C)  SpO2: 98% 100%   Exam:  Constitutional:  . The patient is awake, alert, and oriented x 3. No acute distress. Respiratory:  . No increased work of breathing. . No wheezes, rales, or rhonchi . No tactile fremitus Cardiovascular:  . Regular rate and rhythm . No murmurs, ectopy, or gallups.  . No lateral PMI. No thrills. Abdomen:  . Abdomen is soft, non-tender, non-distended . No hernias, masses, or organomegaly . Normoactive bowel sounds.  Musculoskeletal:  . No cyanosis, clubbing, or edema Skin:  . No rashes, lesions, ulcers . palpation of skin: no induration or nodules Neurologic:  . CN 2-12 intact . Sensation all 4 extremities intact Psychiatric:  . Mental status o Mood, affect appropriate o Orientation to person, place, time  . judgment and insight appear intact   I have personally reviewed the following:   Today's Data  . Vitals, CBC   Scheduled Meds: . dexamethasone (DECADRON) injection  4 mg Intravenous Q6H  . docusate sodium  100 mg Oral BID  . enoxaparin (LOVENOX) injection  40 mg Subcutaneous Q24H  . folic acid  1 mg Oral Daily  . ketorolac      . levETIRAcetam  500 mg Oral BID  . multivitamin with minerals  1 tablet Oral Daily  . senna  1 tablet Oral BID  . sodium chloride flush  3 mL Intravenous Q12H  . thiamine  100 mg Oral Daily   Continuous Infusions: . lactated ringers      Active Problems:   Mass of upper lobe of left lung   Cerebellar mass   Lung mass   LOS: 3 days   A & P   Left cerebellar mass: Neurosurgery following. NS had recommended to obtain lung biopsy of the left upper lobe mass. Results of pathology will determine whether the patient receives stereotactic radiotherapy or whole brain radiation.  Pt continued on Decadron. Continue telemetry.  Left upper lobe mass: S/P Biopsy and brushings by bronchoscopy. Highly concerning about stage IV malignancy  Continued tobacco use: Keep the patient on nicotine patch. Cessation recommended.  Homeless state: Complicating medical treatment. Social work consulted.  I have seen and examined this patient myself. I have spent 32 minutes in his evaluation and care.  DVT prophylaxis: Lovenox subcutaneous Code Status: Full Family Communication: Patient in room, family not at  bedside Disposition Plan: Uncertain at this time Mylo Choi, DO [facility] Direct contact: see [email or web address]  7PM-7AM contact night coverage as above 01/15/2019, 5:48 PM  LOS: 3 days

## 2019-01-15 NOTE — Anesthesia Postprocedure Evaluation (Signed)
Anesthesia Post Note  Patient: Micheal Vincent  Procedure(s) Performed: VIDEO BRONCHOSCOPY WITH ENDOBRONCHIAL NAVIGATION (N/A )     Patient location during evaluation: PACU Anesthesia Type: General Level of consciousness: awake and alert Pain management: pain level controlled Vital Signs Assessment: post-procedure vital signs reviewed and stable Respiratory status: spontaneous breathing, nonlabored ventilation, respiratory function stable and patient connected to nasal cannula oxygen Cardiovascular status: blood pressure returned to baseline and stable Postop Assessment: no apparent nausea or vomiting Anesthetic complications: no    Last Vitals:  Vitals:   01/15/19 1157 01/15/19 1201  BP: 134/87   Pulse: 69 67  Resp: 18 19  Temp:  36.6 C  SpO2: 97% 98%    Last Pain:  Vitals:   01/15/19 1201  TempSrc:   PainSc: 0-No pain                 [place]

## 2019-01-16 ENCOUNTER — Encounter (HOSPITAL_COMMUNITY): Payer: Self-pay | Admitting: Pulmonary Disease

## 2019-01-16 LAB — CBC WITH DIFFERENTIAL/PLATELET
Abs Immature Granulocytes: 0.07 10*3/uL (ref 0.00–0.07)
Basophils Absolute: 0 10*3/uL (ref 0.0–0.1)
Basophils Relative: 0 %
Eosinophils Absolute: 0 10*3/uL (ref 0.0–0.5)
Eosinophils Relative: 0 %
HCT: 37.1 % — ABNORMAL LOW (ref 39.0–52.0)
Hemoglobin: 12.2 g/dL — ABNORMAL LOW (ref 13.0–17.0)
Immature Granulocytes: 1 %
Lymphocytes Relative: 14 %
Lymphs Abs: 1.8 10*3/uL (ref 0.7–4.0)
MCH: 27.9 pg (ref 26.0–34.0)
MCHC: 32.9 g/dL (ref 30.0–36.0)
MCV: 84.9 fL (ref 80.0–100.0)
Monocytes Absolute: 0.8 10*3/uL (ref 0.1–1.0)
Monocytes Relative: 6 %
Neutro Abs: 10.6 10*3/uL — ABNORMAL HIGH (ref 1.7–7.7)
Neutrophils Relative %: 79 %
Platelets: 546 10*3/uL — ABNORMAL HIGH (ref 150–400)
RBC: 4.37 MIL/uL (ref 4.22–5.81)
RDW: 13.2 % (ref 11.5–15.5)
WBC: 13.3 10*3/uL — ABNORMAL HIGH (ref 4.0–10.5)
nRBC: 0 % (ref 0.0–0.2)

## 2019-01-16 LAB — QUANTIFERON-TB GOLD PLUS: QuantiFERON-TB Gold Plus: UNDETERMINED — AB

## 2019-01-16 LAB — BASIC METABOLIC PANEL
Anion gap: 15 (ref 5–15)
BUN: 12 mg/dL (ref 6–20)
CO2: 25 mmol/L (ref 22–32)
Calcium: 8.8 mg/dL — ABNORMAL LOW (ref 8.9–10.3)
Chloride: 99 mmol/L (ref 98–111)
Creatinine, Ser: 0.8 mg/dL (ref 0.61–1.24)
GFR calc Af Amer: >60 mL/min (ref >60)
GFR calc non Af Amer: >60 mL/min (ref >60)
Glucose, Bld: 162 mg/dL — ABNORMAL HIGH (ref 70–99)
Potassium: 4.1 mmol/L (ref 3.5–5.1)
Sodium: 139 mmol/L (ref 135–145)

## 2019-01-16 LAB — QUANTIFERON-TB GOLD PLUS (RQFGPL)
QuantiFERON Mitogen Value: 0.39 IU/mL
QuantiFERON Nil Value: 0.02 IU/mL
QuantiFERON TB1 Ag Value: 0.01 IU/mL
QuantiFERON TB2 Ag Value: 0.01 IU/mL

## 2019-01-16 LAB — GLUCOSE, CAPILLARY: Glucose-Capillary: 122 mg/dL — ABNORMAL HIGH (ref 70–99)

## 2019-01-16 NOTE — Progress Notes (Signed)
Brief Progress Note:  58 yo male smoker with enlarging Lt upper lobe mass with Lt hilar adenopathy and Rt cerebellar mass. Pt. underwent Navigational Bronch 11/27 for tissue typing of LUL mass. Tolerated procedure well , post procedure CXR showed no pneumothorax or pneumomediastinum , unchanged LUL mass.   Pt is dong well this morning. He is on RA with 96% oxygen saturation.No respiratory distress. Breath sounds are clear. He is ambulating in the room. He states his breathing is good. No issues. We discussed that we will wait on the pathology results to determine if the mass in his lung is cancer, and if so, the  specific  type of cancer to determine best treatment options.He understands that we have a very high suspicion that this is cancer in his lung, as well as in his brain.  We also discussed that he will  be moved to [facility] soon to start radiation treatment to his brain.He is in agreement.   He verbalized understanding of the plan of care. We discussed that we will let him know the path results as soon as we know them.     CC APP time 20 minutes  Magdalen Spatz, MSN, AGACNP-BC [place] Pager # [phone] After 4 pm please call [phone] 01/16/2019 7:59 AM

## 2019-01-16 NOTE — Progress Notes (Signed)
PROGRESS NOTE  Micheal Vincent LGX:[id number] DOB: 06-Oct-1960 DOA: 01/12/2019 PCP: Patient, No Pcp Per  Brief History   58 year old male with history of recently discovered left upper lobe mass, cocaine, alcohol abuse presented to the emergency department with complaints of intractable headache. Left upper lobe mass was discovered on CT chest in September 2020, no biopsy has been done. Patient has been having headache, gait instability, numbness and tingling in his hands and feet for the last few weeks. Patient also has been having chronic cough, night sweats, sporadic fevers and chills. Patient continues to smoke 1 pack a day, has 40 pack year history of smoking. Patient has been homeless, however has been in jail multiple times in the past. Work-up in the emergency department with a CT head and neck demonstrated 3 cm right cerebellar mass with surrounding edema and mass-effect, 5 cm cavitary left upper lobe mass with associated mild mediastinal lymphadenopathy. Lab work-up is unremarkable  The patient has been admitted to a telemetry bed. Neurosurgery has been consulted as has PCCM. The patient underwent bronchoscopy on 01/15/2019 for biosy and brushing of lung lesion. He has tolerated the procedure well.  Consultants  . Neurosurgery . PCCM  Procedures  . Bronchoscopy with brushings and biopsy.  Antibiotics   Anti-infectives (From admission, onward)   None     Subjective  The patient is sitting up in bed with family at bedside. No new complaints.  Objective   Vitals:  Vitals:   01/16/19 1144 01/16/19 1604  BP: 118/73 118/64  Pulse: 63 61  Resp: 18 18  Temp: 97.8 F (36.6 C) 98.6 F (37 C)  SpO2: 99% 98%   Exam:  Constitutional:  . The patient is awake, alert, and oriented x 3. No acute distress. Respiratory:  . No increased work of breathing. . No wheezes, rales, or rhonchi . No tactile fremitus Cardiovascular:  . Regular rate and rhythm . No murmurs, ectopy, or  gallups. . No lateral PMI. No thrills. Abdomen:  . Abdomen is soft, non-tender, non-distended . No hernias, masses, or organomegaly . Normoactive bowel sounds.  Musculoskeletal:  . No cyanosis, clubbing, or edema Skin:  . No rashes, lesions, ulcers . palpation of skin: no induration or nodules Neurologic:  . CN 2-12 intact . Sensation all 4 extremities intact Psychiatric:  . Mental status o Mood, affect appropriate o Orientation to person, place, time  . judgment and insight appear intact  I have personally reviewed the following:   Today's Data  . Vitals, CBC, BMP  Scheduled Meds: . dexamethasone (DECADRON) injection  4 mg Intravenous Q6H  . docusate sodium  100 mg Oral BID  . enoxaparin (LOVENOX) injection  40 mg Subcutaneous Q24H  . folic acid  1 mg Oral Daily  . levETIRAcetam  500 mg Oral BID  . multivitamin with minerals  1 tablet Oral Daily  . senna  1 tablet Oral BID  . sodium chloride flush  3 mL Intravenous Q12H  . thiamine  100 mg Oral Daily   Continuous Infusions: . lactated ringers      Active Problems:   Mass of upper lobe of left lung   Cerebellar mass   Lung mass   LOS: 4 days   A & P   Left cerebellar mass: Neurosurgery following. NS had recommended to obtain lung biopsy of the left upper lobe mass. Results of pathology will determine whether the patient receives stereotactic radiotherapy or whole brain radiation. Pt continued on Decadron. Continue telemetry.  Left  upper lobe mass: S/P Biopsy and brushings by bronchoscopy. Highly concerning for stage IV malignancy. Awaiting pathology.   Continued tobacco use: Keep the patient on nicotine patch. Cessation recommended.  Homeless state: Complicating medical treatment. Social work consulted.  I have seen and examined this patient myself. I have spent 30 minutes in his evaluation and care.  DVT prophylaxis: Lovenox subcutaneous Code Status: Full Family Communication: Patient in room, family  not at bedside Disposition Plan: Uncertain at this time Kolden Dupee, DO [facility] Direct contact: see [email or web address]  7PM-7AM contact night coverage as above 01/16/2019, 4:35 PM  LOS: 3 days

## 2019-01-17 LAB — CBC WITH DIFFERENTIAL/PLATELET
Abs Immature Granulocytes: 0.2 10*3/uL — ABNORMAL HIGH (ref 0.00–0.07)
Basophils Absolute: 0 10*3/uL (ref 0.0–0.1)
Basophils Relative: 0 %
Eosinophils Absolute: 0 10*3/uL (ref 0.0–0.5)
Eosinophils Relative: 0 %
HCT: 38.6 % — ABNORMAL LOW (ref 39.0–52.0)
Hemoglobin: 12.5 g/dL — ABNORMAL LOW (ref 13.0–17.0)
Immature Granulocytes: 1 %
Lymphocytes Relative: 17 %
Lymphs Abs: 2.7 10*3/uL (ref 0.7–4.0)
MCH: 27.7 pg (ref 26.0–34.0)
MCHC: 32.4 g/dL (ref 30.0–36.0)
MCV: 85.6 fL (ref 80.0–100.0)
Monocytes Absolute: 1.1 10*3/uL — ABNORMAL HIGH (ref 0.1–1.0)
Monocytes Relative: 7 %
Neutro Abs: 11.7 10*3/uL — ABNORMAL HIGH (ref 1.7–7.7)
Neutrophils Relative %: 75 %
Platelets: 601 10*3/uL — ABNORMAL HIGH (ref 150–400)
RBC: 4.51 MIL/uL (ref 4.22–5.81)
RDW: 13.4 % (ref 11.5–15.5)
WBC: 15.7 10*3/uL — ABNORMAL HIGH (ref 4.0–10.5)
nRBC: 0 % (ref 0.0–0.2)

## 2019-01-17 LAB — BASIC METABOLIC PANEL
Anion gap: 10 (ref 5–15)
BUN: 12 mg/dL (ref 6–20)
CO2: 27 mmol/L (ref 22–32)
Calcium: 9 mg/dL (ref 8.9–10.3)
Chloride: 100 mmol/L (ref 98–111)
Creatinine, Ser: 0.68 mg/dL (ref 0.61–1.24)
GFR calc Af Amer: >60 mL/min (ref >60)
GFR calc non Af Amer: >60 mL/min (ref >60)
Glucose, Bld: 110 mg/dL — ABNORMAL HIGH (ref 70–99)
Potassium: 4.4 mmol/L (ref 3.5–5.1)
Sodium: 137 mmol/L (ref 135–145)

## 2019-01-17 LAB — GLUCOSE, CAPILLARY: Glucose-Capillary: 113 mg/dL — ABNORMAL HIGH (ref 70–99)

## 2019-01-17 NOTE — Progress Notes (Signed)
PROGRESS NOTE  Micheal Vincent [id number]:[id number] DOB: 08/18/1960 DOA: 01/12/2019 PCP: Patient, No Pcp Per  Brief History   58 year old male with history of recently discovered left upper lobe mass, cocaine, alcohol abuse presented to the emergency department with complaints of intractable headache. Left upper lobe mass was discovered on CT chest in September 2020, no biopsy has been done. Patient has been having headache, gait instability, numbness and tingling in his hands and feet for the last few weeks. Patient also has been having chronic cough, night sweats, sporadic fevers and chills. Patient continues to smoke 1 pack a day, has 40 pack year history of smoking. Patient has been homeless, however has been in jail multiple times in the past. Work-up in the emergency department with a CT head and neck demonstrated 3 cm right cerebellar mass with surrounding edema and mass-effect, 5 cm cavitary left upper lobe mass with associated mild mediastinal lymphadenopathy. Lab work-up is unremarkable  The patient has been admitted to a telemetry bed. Neurosurgery has been consulted as has PCCM. The patient underwent bronchoscopy on 01/15/2019 for biosy and brushing of lung lesion. He has tolerated the procedure well. Pathology is pending.  Consultants  . Neurosurgery . PCCM  Procedures  . Bronchoscopy with brushings and biopsy.  Antibiotics   Anti-infectives (From admission, onward)   None     Subjective  The patient is sitting up in bed with family at bedside. No new complaints.  Objective   Vitals:  Vitals:   01/17/19 1134 01/17/19 1529  BP: 117/68 120/68  Pulse: 60 61  Resp: 16 16  Temp: 98.6 F (37 C) 98.3 F (36.8 C)  SpO2: 98% 99%   Exam:  Constitutional:  . The patient is awake, alert, and oriented x 3. No acute distress. Respiratory:  . No increased work of breathing. . No wheezes, rales, or rhonchi . No tactile fremitus Cardiovascular:  . Regular rate and rhythm .  No murmurs, ectopy, or gallups. . No lateral PMI. No thrills. Abdomen:  . Abdomen is soft, non-tender, non-distended . No hernias, masses, or organomegaly . Normoactive bowel sounds.  Musculoskeletal:  . No cyanosis, clubbing, or edema Skin:  . No rashes, lesions, ulcers . palpation of skin: no induration or nodules Neurologic:  . CN 2-12 intact . Sensation all 4 extremities intact Psychiatric:  . Mental status o Mood, affect appropriate o Orientation to person, place, time  . judgment and insight appear intact  I have personally reviewed the following:   Today's Data  . Vitals, CBC, BMP  Scheduled Meds: . dexamethasone (DECADRON) injection  4 mg Intravenous Q6H  . docusate sodium  100 mg Oral BID  . enoxaparin (LOVENOX) injection  40 mg Subcutaneous Q24H  . folic acid  1 mg Oral Daily  . levETIRAcetam  500 mg Oral BID  . multivitamin with minerals  1 tablet Oral Daily  . senna  1 tablet Oral BID  . sodium chloride flush  3 mL Intravenous Q12H  . thiamine  100 mg Oral Daily   Continuous Infusions: . lactated ringers      Active Problems:   Mass of upper lobe of left lung   Cerebellar mass   Lung mass   LOS: 5 days   A & P   Left cerebellar mass: Neurosurgery following. NS had recommended to obtain lung biopsy of the left upper lobe mass. Results of pathology will determine whether the patient receives stereotactic radiotherapy or whole brain radiation. Pt continued on Decadron. Continue  telemetry.  Left upper lobe mass: S/P Biopsy and brushings by bronchoscopy. Highly concerning for stage IV malignancy. Awaiting pathology.   Leukocytosis: No infection is suspected. No fevers. Due to steroids.   Continued tobacco use: Keep the patient on nicotine patch. Cessation recommended.  Homeless state: Complicating medical treatment. Social work consulted.  I have seen and examined this patient myself. I have spent 32 minutes in his evaluation and care.  DVT  prophylaxis: Lovenox subcutaneous Code Status: Full Family Communication: Patient in room, family not at bedside Disposition Plan: Uncertain at this time Micheal Anthis, DO [facility] Direct contact: see [email or web address]  7PM-7AM contact night coverage as above 01/17/2019, 5:25 PM  LOS: 3 days

## 2019-01-18 ENCOUNTER — Encounter: Payer: Self-pay | Admitting: *Deleted

## 2019-01-18 ENCOUNTER — Ambulatory Visit: Payer: Self-pay

## 2019-01-18 ENCOUNTER — Inpatient Hospital Stay (HOSPITAL_COMMUNITY): Payer: Medicaid Other

## 2019-01-18 LAB — COMPREHENSIVE METABOLIC PANEL
ALT: 99 U/L — ABNORMAL HIGH (ref 0–44)
AST: 34 U/L (ref 15–41)
Albumin: 2.4 g/dL — ABNORMAL LOW (ref 3.5–5.0)
Alkaline Phosphatase: 78 U/L (ref 38–126)
Anion gap: 13 (ref 5–15)
BUN: 15 mg/dL (ref 6–20)
CO2: 24 mmol/L (ref 22–32)
Calcium: 8.9 mg/dL (ref 8.9–10.3)
Chloride: 97 mmol/L — ABNORMAL LOW (ref 98–111)
Creatinine, Ser: 0.82 mg/dL (ref 0.61–1.24)
GFR calc Af Amer: >60 mL/min (ref >60)
GFR calc non Af Amer: >60 mL/min (ref >60)
Glucose, Bld: 135 mg/dL — ABNORMAL HIGH (ref 70–99)
Potassium: 4.2 mmol/L (ref 3.5–5.1)
Sodium: 134 mmol/L — ABNORMAL LOW (ref 135–145)
Total Bilirubin: 0.2 mg/dL — ABNORMAL LOW (ref 0.3–1.2)
Total Protein: 6.5 g/dL (ref 6.5–8.1)

## 2019-01-18 LAB — CBC WITH DIFFERENTIAL/PLATELET
Abs Immature Granulocytes: 0.44 10*3/uL — ABNORMAL HIGH (ref 0.00–0.07)
Basophils Absolute: 0 10*3/uL (ref 0.0–0.1)
Basophils Relative: 0 %
Eosinophils Absolute: 0 10*3/uL (ref 0.0–0.5)
Eosinophils Relative: 0 %
HCT: 37.4 % — ABNORMAL LOW (ref 39.0–52.0)
Hemoglobin: 12.4 g/dL — ABNORMAL LOW (ref 13.0–17.0)
Immature Granulocytes: 3 %
Lymphocytes Relative: 15 %
Lymphs Abs: 2.7 10*3/uL (ref 0.7–4.0)
MCH: 27.9 pg (ref 26.0–34.0)
MCHC: 33.2 g/dL (ref 30.0–36.0)
MCV: 84.2 fL (ref 80.0–100.0)
Monocytes Absolute: 1.2 10*3/uL — ABNORMAL HIGH (ref 0.1–1.0)
Monocytes Relative: 7 %
Neutro Abs: 13.6 10*3/uL — ABNORMAL HIGH (ref 1.7–7.7)
Neutrophils Relative %: 75 %
Platelets: 568 10*3/uL — ABNORMAL HIGH (ref 150–400)
RBC: 4.44 MIL/uL (ref 4.22–5.81)
RDW: 13.5 % (ref 11.5–15.5)
WBC: 17.9 10*3/uL — ABNORMAL HIGH (ref 4.0–10.5)
nRBC: 0.1 % (ref 0.0–0.2)

## 2019-01-18 LAB — GLUCOSE, CAPILLARY: Glucose-Capillary: 112 mg/dL — ABNORMAL HIGH (ref 70–99)

## 2019-01-18 LAB — SARS CORONAVIRUS 2 (TAT 6-24 HRS): SARS Coronavirus 2: NEGATIVE

## 2019-01-18 MED ORDER — GADOBUTROL 1 MMOL/ML IV SOLN
7.0000 mL | Freq: Once | INTRAVENOUS | Status: AC | PRN
  Administered 2019-01-18: 7 mL via INTRAVENOUS

## 2019-01-18 NOTE — Progress Notes (Signed)
Oncology Nurse Navigator Documentation  Oncology Nurse Navigator Flowsheets 01/18/2019  Navigator Location [facility]  Referral Date to RadOnc/MedOnc 01/18/2019  Navigator Encounter Type Other/I received referral on Micheal Vincent.  He is currently inpatient with diagnosis pending.  I followed up with Dr. Tammi Klippel and Dr. Julien Nordmann on when to schedule patient for an appt.   Patient Visit Type Inpatient  Treatment Phase Abnormal Scans  Barriers/Navigation Needs Coordination of Care  Interventions Coordination of Care  Acuity Level 2-Minimal Needs (1-2 Barriers Identified)  Coordination of Care Other  Time Spent with Patient 15

## 2019-01-18 NOTE — Progress Notes (Signed)
PCCM:  Awaiting path results.   Garner Nash, DO  [facility] 01/18/2019 11:18 AM

## 2019-01-18 NOTE — Progress Notes (Signed)
PROGRESS NOTE    Micheal Vincent  IOX:[id number] DOB: [birth year] DOA: 01/12/2019 PCP: Patient, No Pcp Per   Brief Narrative: 58 year old male with history of recently discovered left upper lobe mass, cocaine, alcohol abuse presented to the emergency department with complaints of intractable headache. Left upper lobe mass was discovered on CT chest in September 2020, no biopsy has been done. Patient has been having headache, gait instability, numbness and tingling in his hands and feet for the last few weeks. Patient also has been having chronic cough, night sweats, sporadic fevers and chills. Patient continues to smoke 1 pack a day, has 40 pack year history of smoking. Patient has been homeless, however has been in jail multiple times in the past. Work-up in the emergency department with a CT head and neck demonstrated 3 cm right cerebellar mass with surrounding edema and mass-effect, 5 cm cavitary left upper lobe mass with associated mild mediastinal lymphadenopathy. Lab work-up is unremarkable   Neurosurgery has been consulted as has PCCM. The patient underwent bronchoscopy on 01/15/2019 for biosy and brushing of lung lesion. He has tolerated the procedure well. Pathology is pending   Assessment & Plan:   Active Problems:   Mass of upper lobe of left lung   Cerebellar mass   Lung mass  Left cerebellar mass: Neurosurgeryfollowing. NS had recommended to obtain lung biopsy of the left upper lobe mass. Results of pathology will determine whether the patient receives stereotactic radiotherapy or whole brain radiation. Pt continued onDecadron. Continue telemetry. -Appreciate Dr Tammi Klippel with radiation oncology evaluation.  -on Keppra.   Left upper lobe mass: S/P Biopsy and brushings by bronchoscopy. Highly concerning for stage IV malignancy. Awaiting pathology.   Leukocytosis: No infection is suspected. No fevers. Due to steroids.   Continued tobacco use: Keep the patient on nicotine  patch. Cessation recommended.  Homeless state:  Social work consulted.       Estimated body mass index is 21.98 kg/m as calculated from the following:   Height as of this encounter: 6' (1.829 m).   Weight as of this encounter: 73.5 kg.   DVT prophylaxis: lovenox Code Status: full code Family Communication: care discussed with patient Disposition Plan: remain in the hospital for treatment of probably malignancy Consultants:   CCM]  Radiation oncology  neurosurgery  Procedures:   Bronchoscopy   Antimicrobials:  none  Subjective: He is awaiting pathology result. Breathing ok.   Objective: Vitals:   01/18/19 0541 01/18/19 0732 01/18/19 1121 01/18/19 1533  BP: 113/76 119/75 126/77 108/67  Pulse: (!) 52 71 (!) 57 (!) 57  Resp: 16 18 18 18   Temp: 97.7 F (36.5 C) 98.3 F (36.8 C) 98 F (36.7 C) 98.6 F (37 C)  TempSrc:  Oral Oral Oral  SpO2: 97% 95% 100% 99%  Weight:      Height:        Intake/Output Summary (Last 24 hours) at 01/18/2019 1832 Last data filed at 01/18/2019 1500 Gross per 24 hour  Intake 560 ml  Output 1852 ml  Net -1292 ml   Filed Weights   01/15/19 0914 01/17/19 0415 01/18/19 0500  Weight: 76 kg 72.3 kg 73.5 kg    Examination:  General exam: Appears calm and comfortable  Respiratory system: Clear to auscultation. Respiratory effort normal. Cardiovascular system: S1 & S2 heard, RRR. No JVD, murmurs, rubs, gallops or clicks. No pedal edema. Gastrointestinal system: Abdomen is nondistended, soft and nontender. No organomegaly or masses felt. Normal bowel sounds heard. Central nervous system: Alert  and oriented.  Extremities: Symmetric 5 x 5 power. Skin: No rashes, lesions or ulcers    Data Reviewed: I have personally reviewed following labs and imaging studies  CBC: Recent Labs  Lab 01/13/19 0245 01/15/19 0249 01/16/19 0341 01/17/19 0718 01/18/19 0406  WBC 7.0 12.5* 13.3* 15.7* 17.9*  NEUTROABS  --   --  10.6* 11.7*  13.6*  HGB 12.3* 11.9* 12.2* 12.5* 12.4*  HCT 37.0* 36.6* 37.1* 38.6* 37.4*  MCV 84.7 85.1 84.9 85.6 84.2  PLT 537* 617* 546* 601* 353*   Basic Metabolic Panel: Recent Labs  Lab 01/12/19 1457 01/12/19 1950 01/13/19 0245 01/16/19 0341 01/17/19 0718 01/18/19 0406  NA 138  --  135 139 137 134*  K 4.0  --  4.5 4.1 4.4 4.2  CL 105  --  99 99 100 97*  CO2 24  --  25 25 27 24   GLUCOSE 91  --  136* 162* 110* 135*  BUN 8  --  12 12 12 15   CREATININE 0.84  --  0.84 0.80 0.68 0.82  CALCIUM 8.9  --  9.0 8.8* 9.0 8.9  MG  --  2.5*  --   --   --   --   PHOS  --  3.1  --   --   --   --    GFR: Estimated Creatinine Clearance: 102.1 mL/min (by C-G formula based on SCr of 0.82 mg/dL). Liver Function Tests: Recent Labs  Lab 01/18/19 0406  AST 34  ALT 99*  ALKPHOS 78  BILITOT 0.2*  PROT 6.5  ALBUMIN 2.4*   No results for input(s): LIPASE, AMYLASE in the last 168 hours. No results for input(s): AMMONIA in the last 168 hours. Coagulation Profile: Recent Labs  Lab 01/12/19 2116  INR 1.3*   Cardiac Enzymes: No results for input(s): CKTOTAL, CKMB, CKMBINDEX, TROPONINI in the last 168 hours. BNP (last 3 results) No results for input(s): PROBNP in the last 8760 hours. HbA1C: No results for input(s): HGBA1C in the last 72 hours. CBG: Recent Labs  Lab 01/15/19 0617 01/15/19 0910 01/16/19 0608 01/17/19 0601 01/18/19 0628  GLUCAP 110* 106* 122* 113* 112*   Lipid Profile: No results for input(s): CHOL, HDL, LDLCALC, TRIG, CHOLHDL, LDLDIRECT in the last 72 hours. Thyroid Function Tests: No results for input(s): TSH, T4TOTAL, FREET4, T3FREE, THYROIDAB in the last 72 hours. Anemia Panel: No results for input(s): VITAMINB12, FOLATE, FERRITIN, TIBC, IRON, RETICCTPCT in the last 72 hours. Sepsis Labs: No results for input(s): PROCALCITON, LATICACIDVEN in the last 168 hours.  Recent Results (from the past 240 hour(s))  SARS Coronavirus 2 by RT PCR (hospital order, performed in [facility] hospital lab) Nasopharyngeal Nasopharyngeal Swab     Status: None   Collection Time: 01/12/19  7:29 PM   Specimen: Nasopharyngeal Swab  Result Value Ref Range Status   SARS Coronavirus 2 NEGATIVE NEGATIVE Final    Comment: (NOTE) SARS-CoV-2 target nucleic acids are NOT DETECTED. The SARS-CoV-2 RNA is generally detectable in upper and lower respiratory specimens during the acute phase of infection. The lowest concentration of SARS-CoV-2 viral copies this assay can detect is 250 copies / mL. A negative result does not preclude SARS-CoV-2 infection and should not be used as the sole basis for treatment or other patient management decisions.  A negative result may occur with improper specimen collection / handling, submission of specimen other than nasopharyngeal swab, presence of viral mutation(s) within the areas targeted by this assay, and inadequate number  of viral copies (<250 copies / mL). A negative result must be combined with clinical observations, patient history, and epidemiological information. Fact Sheet for Patients:   StrictlyIdeas.no Fact Sheet for Healthcare Providers: BankingDealers.co.za This test is not yet approved or cleared  by the [place] FDA and has been authorized for detection and/or diagnosis of SARS-CoV-2 by FDA under an Emergency Use Authorization (EUA).  This EUA will remain in effect (meaning this test can be used) for the duration of the COVID-19 declaration under Section 564(b)(1) of the Act, 21 U.S.C. section 360bbb-3(b)(1), unless the authorization is terminated or revoked sooner. Performed at [facility], [place] [address], [place], [place] [address]   SARS CORONAVIRUS 2 (TAT 6-24 HRS) Nasopharyngeal Nasopharyngeal Swab     Status: None   Collection Time: 01/17/19  7:35 PM   Specimen: Nasopharyngeal Swab  Result Value Ref Range Status   SARS Coronavirus 2 NEGATIVE NEGATIVE Final     Comment: (NOTE) SARS-CoV-2 target nucleic acids are NOT DETECTED. The SARS-CoV-2 RNA is generally detectable in upper and lower respiratory specimens during the acute phase of infection. Negative results do not preclude SARS-CoV-2 infection, do not rule out co-infections with other pathogens, and should not be used as the sole basis for treatment or other patient management decisions. Negative results must be combined with clinical observations, patient history, and epidemiological information. The expected result is Negative. Fact Sheet for Patients: SugarRoll.be Fact Sheet for Healthcare Providers: https://www.woods-mathews.com/ This test is not yet approved or cleared by the [place] FDA and  has been authorized for detection and/or diagnosis of SARS-CoV-2 by FDA under an Emergency Use Authorization (EUA). This EUA will remain  in effect (meaning this test can be used) for the duration of the COVID-19 declaration under Section 56 4(b)(1) of the Act, 21 U.S.C. section 360bbb-3(b)(1), unless the authorization is terminated or revoked sooner. Performed at [facility], [place] [address], [place], [place] [address]          Radiology Studies: No results found.      Scheduled Meds:  dexamethasone (DECADRON) injection  4 mg Intravenous Q6H   docusate sodium  100 mg Oral BID   enoxaparin (LOVENOX) injection  40 mg Subcutaneous J85U   folic acid  1 mg Oral Daily   levETIRAcetam  500 mg Oral BID   multivitamin with minerals  1 tablet Oral Daily   senna  1 tablet Oral BID   sodium chloride flush  3 mL Intravenous Q12H   thiamine  100 mg Oral Daily   Continuous Infusions:  lactated ringers       LOS: 6 days    Time spent: 35 minutes.     Elmarie Shiley, MD [facility]   If 7PM-7AM, please contact night-coverage [email or web address] Password [facility] 01/18/2019, 6:32 PM

## 2019-01-18 NOTE — Progress Notes (Addendum)
  Radiation Oncology         [phone]) [phone] ________________________________  Name: Micheal Vincent MRN: [id number]  Date: 01/12/2019  DOB: 09/13/1960  Chart Note:  Thank you to Dr. Valeta Harms for getting bronch completed, path pending.  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    Given his diagnosis, he may be eligible for disability and Medicaid, and may benefit from social support if still homeless.  If path shows non-small cell cancer, would consider SRS without resection.  If small cell cancer, then whole brain RT.  Recommend social worker consult if not already.  ________________________________  Sheral Apley Tammi Klippel, M.D.

## 2019-01-19 ENCOUNTER — Ambulatory Visit
Admit: 2019-01-19 | Discharge: 2019-01-19 | Disposition: A | Discharge status: Home / Self Care | Payer: Medicaid Other | Attending: Radiation Oncology | Admitting: Radiation Oncology

## 2019-01-19 ENCOUNTER — Other Ambulatory Visit: Payer: Self-pay

## 2019-01-19 ENCOUNTER — Encounter: Payer: Self-pay | Admitting: Radiation Oncology

## 2019-01-19 ENCOUNTER — Ambulatory Visit
Admit: 2019-01-19 | Discharge: 2019-01-19 | Disposition: A | Discharge status: Home / Self Care | Payer: Medicaid Other | Source: Ambulatory Visit | Attending: Radiation Oncology | Admitting: Radiation Oncology

## 2019-01-19 VITALS — Ht 72.0 in | Wt 157.0 lb

## 2019-01-19 DIAGNOSIS — C7949 Secondary malignant neoplasm of other parts of nervous system: Secondary | ICD-10-CM

## 2019-01-19 DIAGNOSIS — G9389 Other specified disorders of brain: Secondary | ICD-10-CM

## 2019-01-19 DIAGNOSIS — C7931 Secondary malignant neoplasm of brain: Secondary | ICD-10-CM

## 2019-01-19 DIAGNOSIS — Z510 Encounter for antineoplastic radiation therapy: Secondary | ICD-10-CM | POA: Insufficient documentation

## 2019-01-19 DIAGNOSIS — C3412 Malignant neoplasm of upper lobe, left bronchus or lung: Secondary | ICD-10-CM | POA: Insufficient documentation

## 2019-01-19 LAB — GLUCOSE, CAPILLARY: Glucose-Capillary: 130 mg/dL — ABNORMAL HIGH (ref 70–99)

## 2019-01-19 NOTE — Progress Notes (Signed)
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 lobe white matter on series 3, image 40. This remains without enhancement. But there is now faint FLAIR hyperintense correlate on series 6, image 41. Stable gray and white matter signal elsewhere. No other restricted diffusion. No midline shift, ventriculomegaly, extra-axial collection or acute intracranial hemorrhage. Cervicomedullary junction and pituitary are within normal limits. Vascular: Major intracranial vascular flow voids are stable, the left vertebral artery appears somewhat dominant. The major dural venous sinuses are enhancing and appear to be patent. Skull and upper cervical spine: Negative visible cervical spine and spinal cord. Visualized bone marrow signal is within normal limits. Sinuses/Orbits: Stable and negative. Other: Mastoids remain clear. Visible internal auditory structures appear normal. Stable right face superficial sebaceous type cyst. IMPRESSION: 1. Right cerebellar mass with regression of regional edema and intensity of enhancement compared to 01/13/2019. This remains most compatible with solitary brain metastasis. 2. No other abnormal enhancement or metastatic disease identified. The punctate  left frontal lobe white matter DWI lesion remains nonenhancing but now has faint correlation on FLAIR imaging, favoring punctate infarct. 3. No new intracranial abnormality. Electronically Signed   By: Genevie Ann M.D.   On: 01/18/2019 23:28   Mr Jeri Cos And Wo Contrast  Result Date: 01/13/2019 CLINICAL DATA:  58 year old male with persistent headache. Progressive left upper lung mass, abnormal hypodensity in the right cerebellum on CT. EXAM: MRI HEAD WITHOUT AND WITH CONTRAST TECHNIQUE: Multiplanar, multiecho pulse sequences of the brain and surrounding structures were obtained without and with intravenous contrast. CONTRAST:  7.77mL GADAVIST GADOBUTROL 1 MMOL/ML IV SOLN COMPARISON:  CTA head and neck 01/12/2019,  CT head 01/05/2019. FINDINGS: Brain: Approximately 24 millimeter mostly rim enhancing mass located in the right superior cerebellum corresponds to the hypodense lesion by CT. This has spiculated margins (series 10, image 35) and is superficially located, raising the possibility of some leptomeningeal involvement. Confluent cerebellar edema which crosses midline (series 5, image 17). Subsequent partially compressed 4th ventricle. Although the lateral and 3rd ventricles remain normal without transependymal edema. Subsequent crowding of the posterior fossa with mild effacement of the cisterna magna. No overt tonsillar herniation at this time. No other enhancing brain mass or abnormal intracranial enhancement identified. No pachymeningeal thickening. The enhancing mass is visible on diffusion weighted imaging, and additionally there is a punctate area of abnormal trace diffusion in the left anterior frontal lobe white matter on series 2, image 39. This might be restricted on ADC (series 250 image 39) but there is no associated enhancement, and no discrete T2 or FLAIR correlate. No other abnormal diffusion. Minimal nonspecific cerebral white matter T2 and FLAIR hyperintensity elsewhere. No cortical  encephalomalacia or chronic cerebral blood products. Suprasellar and interpeduncular cisterns remain normal. No superimposed midline shift, extra-axial collection or acute intracranial hemorrhage. Pituitary within normal limits. Vascular: Major intracranial vascular flow voids are preserved. The major dural venous sinuses are enhancing and appear to be patent. Skull and upper cervical spine: Negative visible cervical spine and spinal cord. Visualized bone marrow signal is within normal limits. Sinuses/Orbits: Negative orbits. Trace paranasal sinus mucosal thickening. Other: Mastoids are clear. Visible internal auditory structures appear normal. Benign appearing right lateral face sebaceous cyst or similar lesion. Otherwise negative visible scalp and face soft tissues. IMPRESSION: 1. Solitary 24 mm enhancing mass in the right superior cerebellum most compatible with a solitary brain metastasis in this setting. Superficial position and spiculated margins such that early leptomeningeal involvement is not excluded. Confluent cerebellar edema which crosses midline. Mass effect in the posterior fossa, including compression of the 4th ventricle and crowding of the cisterna magna, but no ventriculomegaly or transependymal edema. 2. Superimposed punctate diffusion abnormality in the left anterior frontal lobe white matter with no associated enhancement, T2 or FLAIR signal. This is nonspecific at this time, but punctate lacunar infarct is currently favored over early metastasis. Attention directed on follow-up. 3. No other metastatic disease or acute intracranial abnormality identified. Electronically Signed   By: Genevie Ann M.D.   On: 01/13/2019 04:35   Ct Abdomen Pelvis W Contrast  Result Date: 01/12/2019 CLINICAL DATA:  Known left upper lobe lung mass EXAM: CT CHEST, ABDOMEN, AND PELVIS WITH CONTRAST TECHNIQUE: Multidetector CT imaging of the chest, abdomen and pelvis was performed following the standard protocol during  bolus administration of intravenous contrast. CONTRAST:  149mL OMNIPAQUE IOHEXOL 300 MG/ML  SOLN COMPARISON:  11/11/2018 CT chest, 11/12/2018 CT abdomen and pelvis and 11/13/2018 MRI of the abdomen. FINDINGS: CT CHEST FINDINGS Cardiovascular: Thoracic aorta and its branches are within normal limits. No cardiac enlargement is seen. No pulmonary emboli are seen although opacification is poor of the pulmonary artery. No coronary calcifications are seen. No pericardial effusion is noted. Mediastinum/Nodes: Thoracic inlet is not well visualized due to a technical abnormality within the imaging plane. No mediastinal adenopathy is noted. There is a centrally necrotic lymph node identified in the left hilum measuring 13.6 mm in short axis. The esophagus as visualized is unremarkable. Lungs/Pleura: The right lung demonstrates no focal abnormality. The left lung again demonstrates the previously seen mass lesion with displacement of the bronchial tree. This lies along the major  fissure and now measures approximately 5.9 x 3.9 cm increased from the prior exam at which time it measured 4.1 x 2.9 cm. Additionally deforms the major fissure more than that seen on the prior exam consistent with the focal mass effect. Additionally there is extension of the lesion through the major fissure into the superior segment of the left lower lobe best seen on image number 26 of series 4. Some increasing interstitial thickening is noted adjacent to the mass lesion likely representing some lymphangitic spread of tumor. Additionally significant central necrosis is now seen. Musculoskeletal: No rib lesions are noted although the superior aspect of the chest again is not visualized on this exam. Mild degenerative changes of the thoracic spine are seen. No acute bony lesion is noted. CT ABDOMEN PELVIS FINDINGS Hepatobiliary: The liver again demonstrates scattered hypodensities some which demonstrate peripheral enhancement consistent with  hemangiomas. These are stable from the prior CT examination. Gallbladder is incompletely distended. Some pericholecystic fluid attenuation is noted stable from the prior exam. This was well visualized on prior CT and MR and felt to be related to adenomyomatosis. Pancreas: Unremarkable. No pancreatic ductal dilatation or surrounding inflammatory changes. Spleen: Normal in size without focal abnormality. Adrenals/Urinary Tract: Adrenal glands are within normal limits. The kidneys again demonstrate a normal enhancement pattern. Scattered small cysts are seen bilaterally stable from the prior CT. No ureteral calculi are seen. The bladder is well distended with contrast enhanced urine. Stomach/Bowel: The colon is well visualized without obstructive or inflammatory changes. The appendix is well visualized. No small bowel or gastric abnormality is seen. Vascular/Lymphatic: No significant lymphadenopathy is noted. Scattered atherosclerotic changes of the aorta and iliac vessels are seen. Reproductive: Prostate is unremarkable. Other: No abdominal wall hernia or abnormality. No abdominopelvic ascites. Musculoskeletal: No acute or significant osseous findings. IMPRESSION: Enlarging left upper lobe mass lesion with significant increased central necrosis consistent with enlarging pulmonary neoplasm. There is now extension of the lesion across the major fissure into the superior segment of the left as well as changes consistent with lymphangitic spread of carcinoma. Central left hilar adenopathy with necrosis is seen new from the prior exam. The dominant node measures 13.6 mm within the left hilum and is new from the prior exam. These changes correspond with that seen on recent MRI and CT suspicious for metastatic disease. No intra-abdominal metastatic disease is noted. Chronic changes are noted within the kidneys and liver as well as the gallbladder similar to that seen on prior CT and MRI examination. Electronically Signed    By: Inez Catalina M.D.   On: 01/12/2019 20:55   Dg Chest Port 1 View  Result Date: 01/15/2019 CLINICAL DATA:  Post left video bronchoscopy with endobronchial navigational. Left lung mass. EXAM: PORTABLE CHEST 1 VIEW COMPARISON:  Most recent comparison chest CT 01/12/2019 FINDINGS: Left upper lobe mass, not significantly changed from recent CT. No visualized pneumothorax or pneumomediastinum. Normal heart size with unchanged mediastinal contours. No new airspace disease. No pleural fluid or pulmonary edema. IMPRESSION: 1. Unchanged left upper lobe mass. 2. No acute findings after bronchoscopy, no new abnormality. Electronically Signed   By: Keith Rake M.D.   On: 01/15/2019 12:11   Dg C-arm Bronchoscopy  Result Date: 01/15/2019 C-ARM BRONCHOSCOPY: Fluoroscopy was utilized by the requesting physician.  No radiographic interpretation.      IMPRESSION/PLAN: . 1. 58 y.o. gentleman with newly diagnosed LUL lung mass and solitary right cerebellar brain metastasis, most likely secondary to stage IV lung cancer but tissue  diagnosis remains pending. This patient was presented in our brain conference on 01/13/2019 where we reviewed his brain MRI and chest CT.  Today, we talked to the patient and his ex-wife about the findings and workup thus far. He appears to have a new primary left upper lobe lung cancer with solitary cerebellar metastasis, probably stage IV lung cancer. At this point, the patient would potentially benefit from radiotherapy. The options include whole brain irradiation versus stereotactic radiosurgery. There are pros and cons associated with each of these potential treatment options. Whole brain radiotherapy would treat the known metastatic deposits and help provide some reduction of risk for future brain metastases. However, whole brain radiotherapy carries potential risks including hair loss, subacute somnolence, and neurocognitive changes including a possible reduction in short-term  memory. Whole brain radiotherapy also may carry a lower likelihood of tumor control at the treatment sites because of the low-dose used. Stereotactic radiosurgery carries a higher likelihood for local tumor control at the targeted sites with lower associated risk for neurocognitive changes such as memory loss. However, the use of stereotactic radiosurgery in this setting may leave the patient at increased risk for new brain metastases elsewhere in the brain as high as 50-60%. Accordingly, patients who receive stereotactic radiosurgery in this setting should undergo ongoing surveillance imaging with brain MRI more frequently in order to identify and treat new small brain metastases before they become symptomatic. Stereotactic radiosurgery does carry some different risks, including a risk of radionecrosis.  We discussed the pros and cons of each. We also discussed the logistics and delivery of each. We reviewed the results associated with each of the treatments described above. If his pathology shows non-small cell lung cancer, we would consider SRS without surgical resection.  If it shows small cell lung cancer, then the recommendation is for whole brain radiation therapy. The patient seems to understand the treatment options and would like to proceed with stereotactic radiosurgery pending pathology confirmation of NSCLC.  He will have CT SIM/treatment planning today in anticipation of proceeding with a single fraction treatment of SRS on Friday, 01/22/19 pending pathology confirmation of NSCLC.  His treatment will be coordinated with neurosurgeon, Dr. Vertell Limber.  Preferably, the patient will be transferred to [facility] and remain admitted to better facilitate his radiotherapy as well as medical oncology consult once his pathology is confirmed.  He should remain on oral Decadron but this can be reduced to 4 mg p.o. twice daily with plans to taper him off once he has completed his brain  radiation.     Nicholos Johns, PA-C    Tyler Pita, MD  [facility] Direct Dial: [phone]   Fax: [phone] [email or web address]   Skype   [email or web address]   This document serves as a record of services personally performed by Tyler Pita, MD and Freeman Caldron, PA-C. It was created on their behalf by Wilburn Mylar, a trained medical scribe. The creation of this record is based on the scribe's personal observations and the provider's statements to them. This document has been checked and approved by the attending provider.

## 2019-01-19 NOTE — Progress Notes (Signed)
PROGRESS NOTE    Micheal Vincent  YNW:[id number] DOB: 12-Jun-1960 DOA: 01/12/2019 PCP: Patient, No Pcp Per   Brief Narrative: 58 year old male with history of recently discovered left upper lobe mass, cocaine, alcohol abuse presented to the emergency department with complaints of intractable headache. Left upper lobe mass was discovered on CT chest in September 2020, no biopsy has been done. Patient has been having headache, gait instability, numbness and tingling in his hands and feet for the last few weeks. Patient also has been having chronic cough, night sweats, sporadic fevers and chills. Patient continues to smoke 1 pack a day, has 40 pack year history of smoking. Patient has been homeless, however has been in jail multiple times in the past. Work-up in the emergency department with a CT head and neck demonstrated 3 cm right cerebellar mass with surrounding edema and mass-effect, 5 cm cavitary left upper lobe mass with associated mild mediastinal lymphadenopathy. Lab work-up is unremarkable   Neurosurgery has been consulted as has PCCM. The patient underwent bronchoscopy on 01/15/2019 for biosy and brushing of lung lesion. He has tolerated the procedure well. Pathology is pending   Assessment & Plan:   Active Problems:   Mass of upper lobe of left lung   Cerebellar mass   Lung mass   Left cerebellar mass: Neurosurgeryfollowing. NS had recommended to obtain lung biopsy of the left upper lobe mass. Results of pathology will determine whether the patient receives stereotactic radiotherapy or whole brain radiation. Pt continued onDecadron -Appreciate Dr Tammi Klippel with radiation oncology evaluation.  -on Keppra/decadron  Left upper lobe mass:  -S/P Biopsy and brushings by bronchoscopy. Highly concerning for stage IV malignancy.  -Awaiting pathology results  Leukocytosis:  -No infection is suspected. No fevers. Due to steroids.   Continued tobacco use:  -nicotine patch.  Cessation recommended.  Homeless state:  -care management consult appreciated    DVT prophylaxis: lovenox Code Status: full code Family Communication: care discussed with patient Disposition Plan: remain in the hospital-- as he is homeless, this complicates his discharge  Consultants:   CCM  Radiation oncology  neurosurgery  Procedures:   Bronchoscopy   Antimicrobials:  none  Subjective: No overnight events  Objective: Vitals:   01/19/19 0352 01/19/19 0500 01/19/19 0831 01/19/19 1235  BP: 131/80  120/82 139/83  Pulse: 64  70 (!) 55  Resp: 16  18 15   Temp: 97.9 F (36.6 C)  98.3 F (36.8 C) 98.6 F (37 C)  TempSrc: Oral  Oral Oral  SpO2: 98%  96% 100%  Weight:  72.6 kg    Height:        Intake/Output Summary (Last 24 hours) at 01/19/2019 1332 Last data filed at 01/18/2019 1500 Gross per 24 hour  Intake --  Output 600 ml  Net -600 ml   Filed Weights   01/17/19 0415 01/18/19 0500 01/19/19 0500  Weight: 72.3 kg 73.5 kg 72.6 kg    Examination:  General exam: up walking to the bathroom pleasant  Moving all 4 ext   Data Reviewed: I have personally reviewed following labs and imaging studies  CBC: Recent Labs  Lab 01/13/19 0245 01/15/19 0249 01/16/19 0341 01/17/19 0718 01/18/19 0406  WBC 7.0 12.5* 13.3* 15.7* 17.9*  NEUTROABS  --   --  10.6* 11.7* 13.6*  HGB 12.3* 11.9* 12.2* 12.5* 12.4*  HCT 37.0* 36.6* 37.1* 38.6* 37.4*  MCV 84.7 85.1 84.9 85.6 84.2  PLT 537* 617* 546* 601* 657*   Basic Metabolic Panel: Recent Labs  Lab 01/12/19 1457 01/12/19 1950 01/13/19 0245 01/16/19 0341 01/17/19 0718 01/18/19 0406  NA 138  --  135 139 137 134*  K 4.0  --  4.5 4.1 4.4 4.2  CL 105  --  99 99 100 97*  CO2 24  --  25 25 27 24   GLUCOSE 91  --  136* 162* 110* 135*  BUN 8  --  12 12 12 15   CREATININE 0.84  --  0.84 0.80 0.68 0.82  CALCIUM 8.9  --  9.0 8.8* 9.0 8.9  MG  --  2.5*  --   --   --   --   PHOS  --  3.1  --   --   --   --     GFR: Estimated Creatinine Clearance: 100.8 mL/min (by C-G formula based on SCr of 0.82 mg/dL). Liver Function Tests: Recent Labs  Lab 01/18/19 0406  AST 34  ALT 99*  ALKPHOS 78  BILITOT 0.2*  PROT 6.5  ALBUMIN 2.4*   No results for input(s): LIPASE, AMYLASE in the last 168 hours. No results for input(s): AMMONIA in the last 168 hours. Coagulation Profile: Recent Labs  Lab 01/12/19 2116  INR 1.3*   Cardiac Enzymes: No results for input(s): CKTOTAL, CKMB, CKMBINDEX, TROPONINI in the last 168 hours. BNP (last 3 results) No results for input(s): PROBNP in the last 8760 hours. HbA1C: No results for input(s): HGBA1C in the last 72 hours. CBG: Recent Labs  Lab 01/15/19 0910 01/16/19 0608 01/17/19 0601 01/18/19 0628 01/19/19 0608  GLUCAP 106* 122* 113* 112* 130*   Lipid Profile: No results for input(s): CHOL, HDL, LDLCALC, TRIG, CHOLHDL, LDLDIRECT in the last 72 hours. Thyroid Function Tests: No results for input(s): TSH, T4TOTAL, FREET4, T3FREE, THYROIDAB in the last 72 hours. Anemia Panel: No results for input(s): VITAMINB12, FOLATE, FERRITIN, TIBC, IRON, RETICCTPCT in the last 72 hours. Sepsis Labs: No results for input(s): PROCALCITON, LATICACIDVEN in the last 168 hours.  Recent Results (from the past 240 hour(s))  SARS Coronavirus 2 by RT PCR (hospital order, performed in [facility] hospital lab) Nasopharyngeal Nasopharyngeal Swab     Status: None   Collection Time: 01/12/19  7:29 PM   Specimen: Nasopharyngeal Swab  Result Value Ref Range Status   SARS Coronavirus 2 NEGATIVE NEGATIVE Final    Comment: (NOTE) SARS-CoV-2 target nucleic acids are NOT DETECTED. The SARS-CoV-2 RNA is generally detectable in upper and lower respiratory specimens during the acute phase of infection. The lowest concentration of SARS-CoV-2 viral copies this assay can detect is 250 copies / mL. A negative result does not preclude SARS-CoV-2 infection and should not be used as the  sole basis for treatment or other patient management decisions.  A negative result may occur with improper specimen collection / handling, submission of specimen other than nasopharyngeal swab, presence of viral mutation(s) within the areas targeted by this assay, and inadequate number of viral copies (<250 copies / mL). A negative result must be combined with clinical observations, patient history, and epidemiological information. Fact Sheet for Patients:   StrictlyIdeas.no Fact Sheet for Healthcare Providers: BankingDealers.co.za This test is not yet approved or cleared  by the [place] FDA and has been authorized for detection and/or diagnosis of SARS-CoV-2 by FDA under an Emergency Use Authorization (EUA).  This EUA will remain in effect (meaning this test can be used) for the duration of the COVID-19 declaration under Section 564(b)(1) of the Act, 21 U.S.C. section 360bbb-3(b)(1), unless the authorization  is terminated or revoked sooner. Performed at [facility], [place] [address], [place], [place] [address]   SARS CORONAVIRUS 2 (TAT 6-24 HRS) Nasopharyngeal Nasopharyngeal Swab     Status: None   Collection Time: 01/17/19  7:35 PM   Specimen: Nasopharyngeal Swab  Result Value Ref Range Status   SARS Coronavirus 2 NEGATIVE NEGATIVE Final    Comment: (NOTE) SARS-CoV-2 target nucleic acids are NOT DETECTED. The SARS-CoV-2 RNA is generally detectable in upper and lower respiratory specimens during the acute phase of infection. Negative results do not preclude SARS-CoV-2 infection, do not rule out co-infections with other pathogens, and should not be used as the sole basis for treatment or other patient management decisions. Negative results must be combined with clinical observations, patient history, and epidemiological information. The expected result is Negative. Fact Sheet for  Patients: SugarRoll.be Fact Sheet for Healthcare Providers: https://www.woods-mathews.com/ This test is not yet approved or cleared by the [place] FDA and  has been authorized for detection and/or diagnosis of SARS-CoV-2 by FDA under an Emergency Use Authorization (EUA). This EUA will remain  in effect (meaning this test can be used) for the duration of the COVID-19 declaration under Section 56 4(b)(1) of the Act, 21 U.S.C. section 360bbb-3(b)(1), unless the authorization is terminated or revoked sooner. Performed at [facility], [place] [address] [place], [place] [address]          Radiology Studies: Mr Jeri Cos Wo Contrast  Result Date: 01/18/2019 CLINICAL DATA:  58 year old male with enlarging left upper lung mass and right cerebellar presume solitary brain metastasis on recent MRI. Punctate diffusion abnormality in the left anterior frontal lobe at that time without enhancement. Study for stereotactic treatment planning. EXAM: MRI HEAD WITHOUT AND WITH CONTRAST TECHNIQUE: Multiplanar, multiecho pulse sequences of the brain and surrounding structures were obtained without and with intravenous contrast. CONTRAST:  61mL GADAVIST GADOBUTROL 1 MMOL/ML IV SOLN COMPARISON:  Brain MRI 01/13/2019. FINDINGS: Brain: Persistent oval rim enhancing mass in the posterosuperior right cerebellar hemisphere, although with decreased intensity of peripheral enhancement compared to 01/13/2019 (series 12, image 17 today versus series 10, image 35 previously). Size remains stable at 22 millimeters. Cerebellar edema has mildly regressed. Fourth ventricle patency appears improved. Basilar cisterns remain patent. The lesion is visible on diffusion as before. No other abnormal intracranial enhancement identified. No dural thickening. Unchanged punctate DWI lesion in the anterior left frontal lobe white matter on series 3, image 40. This remains without enhancement.  But there is now faint FLAIR hyperintense correlate on series 6, image 41. Stable gray and white matter signal elsewhere. No other restricted diffusion. No midline shift, ventriculomegaly, extra-axial collection or acute intracranial hemorrhage. Cervicomedullary junction and pituitary are within normal limits. Vascular: Major intracranial vascular flow voids are stable, the left vertebral artery appears somewhat dominant. The major dural venous sinuses are enhancing and appear to be patent. Skull and upper cervical spine: Negative visible cervical spine and spinal cord. Visualized bone marrow signal is within normal limits. Sinuses/Orbits: Stable and negative. Other: Mastoids remain clear. Visible internal auditory structures appear normal. Stable right face superficial sebaceous type cyst. IMPRESSION: 1. Right cerebellar mass with regression of regional edema and intensity of enhancement compared to 01/13/2019. This remains most compatible with solitary brain metastasis. 2. No other abnormal enhancement or metastatic disease identified. The punctate left frontal lobe white matter DWI lesion remains nonenhancing but now has faint correlation on FLAIR imaging, favoring punctate infarct. 3. No new intracranial abnormality.  Electronically Signed   By: Genevie Ann M.D.   On: 01/18/2019 23:28        Scheduled Meds:  dexamethasone (DECADRON) injection  4 mg Intravenous Q6H   docusate sodium  100 mg Oral BID   enoxaparin (LOVENOX) injection  40 mg Subcutaneous R15Q   folic acid  1 mg Oral Daily   levETIRAcetam  500 mg Oral BID   multivitamin with minerals  1 tablet Oral Daily   senna  1 tablet Oral BID   sodium chloride flush  3 mL Intravenous Q12H   thiamine  100 mg Oral Daily   Continuous Infusions:  lactated ringers       LOS: 7 days    Time spent: 25 minutes.     Geradine Girt, DO [facility]   If 7PM-7AM, please contact night-coverage [email or web address] Password  [email or web address] 01/19/2019, 1:32 PM

## 2019-01-19 NOTE — Plan of Care (Signed)
Patient progressing towards plan of care goals. 

## 2019-01-19 NOTE — Progress Notes (Signed)
Patient alert and oriented x 3. Denies pain. Reports seeing blood in stool intermittent x 1 year. Patient requested Rise Paganini, ex wife, be on speaker phone to hear consult with [employer], PA-C. Accommodated patient's request. Rise Paganini was able to ask questions of Ashlyn Bruning, PA-C.

## 2019-01-19 NOTE — TOC Initial Note (Addendum)
Transition of Care [facility]) - Initial/Assessment Note    Patient Details  Name: Micheal Vincent MRN: [id number] Date of Birth: January 28, 1961  [facility] CM/SW Contact:    Pollie Friar, RN Phone Number: 01/19/2019, 11:27 AM  Clinical Narrative:                 Pt states he has been staying at family and friends homes. No PCP. Pt will need assist with transportation for appointments.  Pt would like to get set up with one of the [facility]. Will arrange closer to d/c.  Pt agreeable to having information sent over [place] [facility] for assistance.   At d/c pt going to stay at daughters home: Micheal Vincent--[address] in [place]  [phone]--ER spoke with [facility] and verified. She is asking for a fold up bed for the patient. CM has place info in [place] [facility]. TOC following.  Expected Discharge Plan: Home/Self Care Barriers to Discharge: Inadequate or no insurance   Patient Goals and CMS Choice        Expected Discharge Plan and Services Expected Discharge Plan: Home/Self Care In-house Referral: Clinical Social Work Discharge Planning Services: CM Consult   Living arrangements for the past 2 months: Homeless(was living house to house)                                      Prior Living Arrangements/Services Living arrangements for the past 2 months: Homeless(was living house to house) Lives with:: Relatives(and friends) Patient language and need for interpreter reviewed:: Yes Do you feel safe going back to the place where you live?: Yes(will d/c to daughters home)      Need for Family Participation in Patient Care: Yes (Comment) Care giver support system in place?: Yes (comment)(daughter)   Criminal Activity/Legal Involvement Pertinent to Current Situation/Hospitalization: No - Comment as needed  Activities of Daily Living      Permission Sought/Granted                  Emotional Assessment Appearance:: Appears older than stated  age Attitude/Demeanor/Rapport: Engaged Affect (typically observed): Accepting, Pleasant Orientation: : Oriented to Self, Oriented to Place, Oriented to  Time, Oriented to Situation   Psych Involvement: No (comment)  Admission diagnosis:  Lung mass [R91.8] Cerebellar mass [G93.89] Acute nonintractable headache, unspecified headache type [R51.9] Patient Active Problem List   Diagnosis Date Noted  . Lung mass   . Cerebellar mass 01/12/2019  . Tobacco use 11/19/2018  . Cocaine use 11/19/2018  . Peritonsillar abscess 11/10/2018  . Mass of upper lobe of left lung 11/10/2018   PCP:  Patient, No Pcp Per Pharmacy:   [id number] - [place], [place] [address] Phone: [phone] Fax: [place], [place] [address] Phone: [phone] Fax: [phone]  [place] ([place]), [place] - [address] [place] [address] Phone: [phone] Fax: [place], [place] - [address] [place] [address] Phone: [phone] Fax: [phone]     Social Determinants of Health (SDOH) Interventions    Readmission Risk Interventions No flowsheet data found.

## 2019-01-19 NOTE — Progress Notes (Signed)
See progress note under physician encounter. 

## 2019-01-19 NOTE — Progress Notes (Signed)
[facility] called to transport patient to [facility] for oncology appointment.

## 2019-01-20 ENCOUNTER — Encounter: Payer: Self-pay | Admitting: *Deleted

## 2019-01-20 DIAGNOSIS — F1410 Cocaine abuse, uncomplicated: Secondary | ICD-10-CM

## 2019-01-20 LAB — SURGICAL PATHOLOGY

## 2019-01-20 LAB — GLUCOSE, CAPILLARY: Glucose-Capillary: 138 mg/dL — ABNORMAL HIGH (ref 70–99)

## 2019-01-20 MED ORDER — DEXAMETHASONE 4 MG PO TABS
4.0000 mg | ORAL_TABLET | Freq: Three times a day (TID) | ORAL | Status: DC
  Administered 2019-01-20 – 2019-01-21 (×3): 4 mg via ORAL
  Filled 2019-01-20 (×3): qty 1

## 2019-01-20 MED ORDER — FAMOTIDINE 20 MG PO TABS
20.0000 mg | ORAL_TABLET | Freq: Two times a day (BID) | ORAL | Status: DC
  Administered 2019-01-20 – 2019-01-21 (×2): 20 mg via ORAL
  Filled 2019-01-20 (×2): qty 1

## 2019-01-20 NOTE — Progress Notes (Signed)
Received this referral from the main cancer center at [facility] because patient's address was listed as [employer]. Called to reach out to the patient, but spoke to his daughter. Patient is still in the hospital, but will be discharged to the home of the daughter, who lives in [place]. She doesn't want the patient seen in [facility] as there are challenges with transportation, and [place] [facility] is closer to where the patient will be staying after discharge. She would like the referral sent back to [facility].   Sent a message to Norton Blizzard the Lung Cancer Navigator to redirect patient referral. Also updated Dr Maylon Peppers on the patient's preference.

## 2019-01-20 NOTE — Plan of Care (Signed)
Patient doing well, awaiting path results for plan.

## 2019-01-20 NOTE — Progress Notes (Signed)
On review of patient's imaging with Dr. Tammi Klippel, our plan will be Stereotactic Radiosurgery alone (no surgical resection) if non-small cell lung cancer and whole brain radiotherapy if small cell lung cancer.  As of this morning, pathology is still pending.  Treatment MRI has been completed in preparation for stereotactic radiosurgery, should this be recommended based on Pathology of brain metastasis.

## 2019-01-20 NOTE — Progress Notes (Addendum)
PROGRESS NOTE   Micheal Vincent  MWU:[id number]    DOB: [birth year]    DOA: 01/12/2019  PCP: Patient, No Pcp Per   I have briefly reviewed patients previous medical records in [facility].  Chief Complaint  Patient presents with  . Headache    Brief Narrative:  58 year old male, reportedly homeless and staying with his daughter for the last couple of months, incarcerated in jail multiple times in the past, PMH of recently discovered left upper lobe mass in September 2020 that had not been evaluated yet, ongoing polysubstance abuse (cocaine, tobacco &?  Alcohol) presented to [facility] ED on 11/24 due to few weeks history of headache, gait ataxia and numbness and tingling in his hands and feet for the last few weeks.  He also reported chronic cough, night sweats and sporadic fevers and chills.  CT head and neck showed right cerebellar mass with edema and mass-effect and left upper lobe cavitary mass.  Neurosurgery consulted, recommended expedited biopsy of lung mass.   Assessment & Plan:   Active Problems:   Mass of upper lobe of left lung   Cerebellar mass   Lung mass   Left upper lobe lung mass, suspect primary lung cancer  Hospitalized September 2020 when he had CT imaging, found to have left peritonsillar abscess, 4.3 cm spiculated mass in the left upper lobe.  He was then seen by pulmonology who recommended completing antibiotics and follow-up with PET scan as outpatient in 3 to 4 weeks.  Patient however did not arrange follow-up appointment.  Pulmonology was consulted for expedited biopsy.  S/p fiberoptic bronchoscopy with biopsy, brushings and BAL from L[facility] on 11/27.  As of now, none of the pathologies are back.  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  As per radiation oncology and neurosurgery input, if pathology shows non-small cell cancer, would consider SRS without resection.  If small cell cancer,  then whole brain RT.  Right cerebellar mass  Neurosurgery consulted, recommended expedited lung mass biopsy which was done on 11/27.  As per neurosurgery follow-up today, SRS versus whole brain RT depending on pathology.  Continue Decadron 4 mg IV every 6 hours.  Continue Keppra 500 mg twice daily for seizure prophylaxis  Edema around the mass had reduced on recent MRI 11/30.  I discussed with Dr. Dierdre Harness, neurosurgery who recommended changing Decadron to 4 mg p.o. 3 times daily, okay to DC Keppra, he will coordinate with the oncology service regarding appropriate follow-up.  He advised that patient could be discharged home for outpatient follow-up with oncology.  As per PCCM follow-up, pathology report: Poorly differentiated NSCLC.  Tobacco abuse  Reports that he smokes 1.5 pack of cigarettes per day for the last 30-40 years.  Cessation counseled.  Cocaine abuse  Patient reports that he smoked cocaine 3 weeks prior to admission.  Abstinence counseled.  Alcohol use  Reports infrequent alcohol use.  No withdrawal symptoms noted.  Leukocytosis  Likely related to steroids.  Low index of suspicion for infectious etiology.  Homelessness  Patient reports that he will be staying with his daughter upon discharge.  Case management consulted    DVT prophylaxis: Lovenox Code Status: Full Family Communication: None at bedside Disposition: DC home possibly 12/3 with close outpatient follow-up with medical and radiation oncology.   Consultants:  Neurosurgery Pulmonology Radiation Oncology  Procedures:  Navigational bronchoscopy with biopsies 11/27  Antimicrobials:  None   Subjective: Patient  interviewed and examined along with nursing in the room.  Patient reported transient dyspnea after activity.  He reports that he ate a good breakfast then had a BM and on his way back from the bathroom developed some dyspnea without chest pain, cough or wheezing.  Seems to have  resolved after resting and currently denies any complaints.  Did not report headaches.  Objective:  Vitals:   01/20/19 0342 01/20/19 0500 01/20/19 0722 01/20/19 1230  BP: 119/73  123/64 120/68  Pulse: 66  67 65  Resp: _0 Temp: 98.2 F (36.8 C)  98.2 F (36.8 C) 98 F (36.7 C)  TempSrc: Oral  Oral Oral  SpO2: 98%  98% 100%  Weight:  72.8 kg    Height:        Examination:  General exam: Pleasant young male, moderately built and nourished sitting up comfortably in bed without distress. Respiratory system: Clear to auscultation without wheezing, rhonchi or crackles. Respiratory effort normal. Cardiovascular system: S1 & S2 heard, RRR. No JVD, murmurs, rubs, gallops or clicks. No pedal edema.  Telemetry personally reviewed: Mostly sinus rhythm in the 60s.  Occasional sinus bradycardia in the 50s. Gastrointestinal system: Abdomen is nondistended, soft and nontender. No organomegaly or masses felt. Normal bowel sounds heard. Central nervous system: Alert and oriented. No focal neurological deficits. Extremities: Symmetric 5 x 5 power. Skin: No rashes, lesions or ulcers Psychiatry: Judgement and insight appear normal. Mood & affect appropriate.     Data Reviewed: I have personally reviewed following labs and imaging studies   CBC: Recent Labs  Lab 01/15/19 0249 01/16/19 0341 01/17/19 0718 01/18/19 0406  WBC 12.5* 13.3* 15.7* 17.9*  NEUTROABS  --  10.6* 11.7* 13.6*  HGB 11.9* 12.2* 12.5* 12.4*  HCT 36.6* 37.1* 38.6* 37.4*  MCV 85.1 84.9 85.6 84.2  PLT 617* 546* 601* 568*    Basic Metabolic Panel: Recent Labs  Lab 01/16/19 0341 01/17/19 0718 01/18/19 0406  NA 139 137 134*  K 4.1 4.4 4.2  CL 99 100 97*  CO2 _1 GLUCOSE 162* 110* 135*  BUN _2 CREATININE 0.80 0.68 0.82  CALCIUM 8.8* 9.0 8.9    Liver Function Tests: Recent Labs  Lab 01/18/19 0406  AST 34  ALT 99*  ALKPHOS 78  BILITOT 0.2*  PROT 6.5  ALBUMIN 2.4*    CBG: Recent Labs   Lab 01/16/19 0608 01/17/19 0601 01/18/19 0628 01/19/19 0608 01/20/19 0605  GLUCAP 122* 113* 112* 130* 138*    Recent Results (from the past 240 hour(s))  SARS Coronavirus 2 by RT PCR (hospital order, performed in [facility] hospital lab) Nasopharyngeal Nasopharyngeal Swab     Status: None   Collection Time: 01/12/19  7:29 PM   Specimen: Nasopharyngeal Swab  Result Value Ref Range Status   SARS Coronavirus 2 NEGATIVE NEGATIVE Final    Comment: (NOTE) SARS-CoV-2 target nucleic acids are NOT DETECTED. The SARS-CoV-2 RNA is generally detectable in upper and lower respiratory specimens during the acute phase of infection. The lowest concentration of SARS-CoV-2 viral copies this assay can detect is 250 copies / mL. A negative result does not preclude SARS-CoV-2 infection and should not be used as the sole basis for treatment or other patient management decisions.  A negative result may occur with improper specimen collection / handling, submission of specimen other than nasopharyngeal swab, presence of viral mutation(s) within the areas targeted by this assay, and inadequate number of viral copies (<  250 copies / mL). A negative result must be combined with clinical observations, patient history, and epidemiological information. Fact Sheet for Patients:   StrictlyIdeas.no Fact Sheet for Healthcare Providers: BankingDealers.co.za This test is not yet approved or cleared  by the [place] FDA and has been authorized for detection and/or diagnosis of SARS-CoV-2 by FDA under an Emergency Use Authorization (EUA).  This EUA will remain in effect (meaning this test can be used) for the duration of the COVID-19 declaration under Section 564(b)(1) of the Act, 21 U.S.C. section 360bbb-3(b)(1), unless the authorization is terminated or revoked sooner. Performed at [facility], [place] [address] [place], [place] [address]   SARS  CORONAVIRUS 2 (TAT 6-24 HRS) Nasopharyngeal Nasopharyngeal Swab     Status: None   Collection Time: 01/17/19  7:35 PM   Specimen: Nasopharyngeal Swab  Result Value Ref Range Status   SARS Coronavirus 2 NEGATIVE NEGATIVE Final    Comment: (NOTE) SARS-CoV-2 target nucleic acids are NOT DETECTED. The SARS-CoV-2 RNA is generally detectable in upper and lower respiratory specimens during the acute phase of infection. Negative results do not preclude SARS-CoV-2 infection, do not rule out co-infections with other pathogens, and should not be used as the sole basis for treatment or other patient management decisions. Negative results must be combined with clinical observations, patient history, and epidemiological information. The expected result is Negative. Fact Sheet for Patients: SugarRoll.be Fact Sheet for Healthcare Providers: https://www.woods-mathews.com/ This test is not yet approved or cleared by the [place] FDA and  has been authorized for detection and/or diagnosis of SARS-CoV-2 by FDA under an Emergency Use Authorization (EUA). This EUA will remain  in effect (meaning this test can be used) for the duration of the COVID-19 declaration under Section 56 4(b)(1) of the Act, 21 U.S.C. section 360bbb-3(b)(1), unless the authorization is terminated or revoked sooner. Performed at [facility], [place] [address], [place], [place] [address]       Radiology Studies: Mr Jeri Cos Wo Contrast  Result Date: 01/18/2019 CLINICAL DATA:  58 year old male with enlarging left upper lung mass and right cerebellar presume solitary brain metastasis on recent MRI. Punctate diffusion abnormality in the left anterior frontal lobe at that time without enhancement. Study for stereotactic treatment planning. EXAM: MRI HEAD WITHOUT AND WITH CONTRAST TECHNIQUE: Multiplanar, multiecho pulse sequences of the brain and surrounding structures were obtained  without and with intravenous contrast. CONTRAST:  23m GADAVIST GADOBUTROL 1 MMOL/ML IV SOLN COMPARISON:  Brain MRI 01/13/2019. FINDINGS: Brain: Persistent oval rim enhancing mass in the posterosuperior right cerebellar hemisphere, although with decreased intensity of peripheral enhancement compared to 01/13/2019 (series 12, image 17 today versus series 10, image 35 previously). Size remains stable at 22 millimeters. Cerebellar edema has mildly regressed. Fourth ventricle patency appears improved. Basilar cisterns remain patent. The lesion is visible on diffusion as before. No other abnormal intracranial enhancement identified. No dural thickening. Unchanged punctate DWI lesion in the anterior left frontal lobe white matter on series 3, image 40. This remains without enhancement. But there is now faint FLAIR hyperintense correlate on series 6, image 41. Stable gray and white matter signal elsewhere. No other restricted diffusion. No midline shift, ventriculomegaly, extra-axial collection or acute intracranial hemorrhage. Cervicomedullary junction and pituitary are within normal limits. Vascular: Major intracranial vascular flow voids are stable, the left vertebral artery appears somewhat dominant. The major dural venous sinuses are enhancing and appear to be patent. Skull and upper cervical spine: Negative visible cervical spine  and spinal cord. Visualized bone marrow signal is within normal limits. Sinuses/Orbits: Stable and negative. Other: Mastoids remain clear. Visible internal auditory structures appear normal. Stable right face superficial sebaceous type cyst. IMPRESSION: 1. Right cerebellar mass with regression of regional edema and intensity of enhancement compared to 01/13/2019. This remains most compatible with solitary brain metastasis. 2. No other abnormal enhancement or metastatic disease identified. The punctate left frontal lobe white matter DWI lesion remains nonenhancing but now has faint correlation  on FLAIR imaging, favoring punctate infarct. 3. No new intracranial abnormality. Electronically Signed   By: Genevie Ann M.D.   On: 01/18/2019 23:28          Scheduled Meds: . dexamethasone (DECADRON) injection  4 mg Intravenous Q6H  . docusate sodium  100 mg Oral BID  . enoxaparin (LOVENOX) injection  40 mg Subcutaneous Q24H  . folic acid  1 mg Oral Daily  . levETIRAcetam  500 mg Oral BID  . multivitamin with minerals  1 tablet Oral Daily  . senna  1 tablet Oral BID  . sodium chloride flush  3 mL Intravenous Q12H  . thiamine  100 mg Oral Daily   Continuous Infusions: . lactated ringers       LOS: 8 days     Vernell Leep, MD, [place], [facility]. [facility]  To contact the attending provider between 7A-7P or the covering provider during after hours 7P-7A, please log into the web site [email or web address] and access using universal [place] password for that web site. If you do not have the password, please call the hospital operator.  01/20/2019, 2:12 PM

## 2019-01-20 NOTE — Progress Notes (Signed)
PCCM:  I just received telephone call from Dr. Vic Ripper in pathology.   Path: Poorly differentiated NSCLC, CK7 positive, pankeratin positive. Other markers were negative.   [facility] 01/20/2019 3:21 PM

## 2019-01-20 NOTE — Progress Notes (Signed)
Called to the bathroom by patient.  Patient had small brown stool with red streaks and small amount of blood in the water of the bowl.   Patient reports that he has had bloody stools in the past as well.  Dr. On call notified and oncoming RN Colletta Maryland made aware.

## 2019-01-21 ENCOUNTER — Other Ambulatory Visit: Payer: Self-pay | Admitting: Radiation Therapy

## 2019-01-21 DIAGNOSIS — C3492 Malignant neoplasm of unspecified part of left bronchus or lung: Secondary | ICD-10-CM

## 2019-01-21 LAB — COMPREHENSIVE METABOLIC PANEL
ALT: 91 U/L — ABNORMAL HIGH (ref 0–44)
AST: 33 U/L (ref 15–41)
Albumin: 2.4 g/dL — ABNORMAL LOW (ref 3.5–5.0)
Alkaline Phosphatase: 87 U/L (ref 38–126)
Anion gap: 14 (ref 5–15)
BUN: 13 mg/dL (ref 6–20)
CO2: 24 mmol/L (ref 22–32)
Calcium: 8.6 mg/dL — ABNORMAL LOW (ref 8.9–10.3)
Chloride: 98 mmol/L (ref 98–111)
Creatinine, Ser: 0.81 mg/dL (ref 0.61–1.24)
GFR calc Af Amer: >60 mL/min (ref >60)
GFR calc non Af Amer: >60 mL/min (ref >60)
Glucose, Bld: 162 mg/dL — ABNORMAL HIGH (ref 70–99)
Potassium: 4.1 mmol/L (ref 3.5–5.1)
Sodium: 136 mmol/L (ref 135–145)
Total Bilirubin: 0.5 mg/dL (ref 0.3–1.2)
Total Protein: 5.8 g/dL — ABNORMAL LOW (ref 6.5–8.1)

## 2019-01-21 LAB — CBC
HCT: 36.9 % — ABNORMAL LOW (ref 39.0–52.0)
Hemoglobin: 12.2 g/dL — ABNORMAL LOW (ref 13.0–17.0)
MCH: 28.4 pg (ref 26.0–34.0)
MCHC: 33.1 g/dL (ref 30.0–36.0)
MCV: 85.8 fL (ref 80.0–100.0)
Platelets: 515 10*3/uL — ABNORMAL HIGH (ref 150–400)
RBC: 4.3 MIL/uL (ref 4.22–5.81)
RDW: 14.6 % (ref 11.5–15.5)
WBC: 22.2 10*3/uL — ABNORMAL HIGH (ref 4.0–10.5)
nRBC: 0.1 % (ref 0.0–0.2)

## 2019-01-21 LAB — GLUCOSE, CAPILLARY: Glucose-Capillary: 115 mg/dL — ABNORMAL HIGH (ref 70–99)

## 2019-01-21 MED ORDER — ADULT MULTIVITAMIN W/MINERALS CH: 1.0000 | ORAL_TABLET | Freq: Every day | ORAL | Status: DC

## 2019-01-21 MED ORDER — FOLIC ACID 1 MG PO TABS: 1.0000 mg | ORAL_TABLET | Freq: Every day | ORAL | 0 refills | Status: DC

## 2019-01-21 MED ORDER — DOCUSATE SODIUM 100 MG PO CAPS: 100.0000 mg | ORAL_CAPSULE | Freq: Two times a day (BID) | ORAL | 0 refills | Status: DC

## 2019-01-21 MED ORDER — ACETAMINOPHEN 325 MG PO TABS: 650.0000 mg | ORAL_TABLET | Freq: Four times a day (QID) | ORAL | Status: DC | PRN

## 2019-01-21 MED ORDER — ALBUTEROL SULFATE HFA 108 (90 BASE) MCG/ACT IN AERS: 2.0000 | INHALATION_SPRAY | Freq: Four times a day (QID) | RESPIRATORY_TRACT | 0 refills | Status: DC | PRN

## 2019-01-21 MED ORDER — DEXAMETHASONE 4 MG PO TABS: 4.0000 mg | ORAL_TABLET | Freq: Three times a day (TID) | ORAL | 1 refills | Status: DC

## 2019-01-21 MED ORDER — FAMOTIDINE 20 MG PO TABS: 20.0000 mg | ORAL_TABLET | Freq: Two times a day (BID) | ORAL | 1 refills | Status: DC

## 2019-01-21 MED ORDER — THIAMINE HCL 100 MG PO TABS: 100.0000 mg | ORAL_TABLET | Freq: Every day | ORAL | 0 refills | Status: DC

## 2019-01-21 MED FILL — VENTOLIN HFA 90 MCG INHALER: 108 (90 BAS | 25 days supply | Qty: 18 | Fill #0

## 2019-01-21 MED FILL — DOK 100 MG CAPS: 100 | 5 days supply | Qty: 10 | Fill #0

## 2019-01-21 MED FILL — FAMOTIDINE 20 MG TABS: 20 | 30 days supply | Qty: 60 | Fill #0

## 2019-01-21 MED FILL — FOLIC ACID 1 MG TABS: 1 | 30 days supply | Qty: 30 | Fill #0

## 2019-01-21 MED FILL — VITAMIN B-1 100 MG TABS: 100 | 30 days supply | Qty: 30 | Fill #0

## 2019-01-21 MED FILL — DEXAMETHASONE 4 MG TABLET: 4 | 30 days supply | Qty: 90 | Fill #0

## 2019-01-21 NOTE — Progress Notes (Signed)
D/with Dr Valeta Harms - ccm can sign off    SIGNATURE    Dr. Brand Males, M.D., F.C.C.P,  Pulmonary and Critical Care Medicine Staff Physician, [place] Director - Interstitial Lung Disease  Program  Pulmonary [place] at [place], [place], [address]  Pager: [phone], If no answer or between  15:00h - 7:00h: call [phone] Telephone: [phone]  10:05 AM 01/21/2019

## 2019-01-21 NOTE — Discharge Summary (Addendum)
Physician Discharge Summary  Giovany Cosby KGU:[id number] DOB: 15-Jul-1960  PCP: Patient, No Pcp Per  Admitted from: Home Discharged to: Home  Admit date: 01/12/2019 Discharge date: 01/21/2019  Recommendations for Outpatient Follow-up:   Follow-up Information    Icard, Octavio Graves, DO. Schedule an appointment as soon as possible for a visit in 3 week(s).   Specialty: Pulmonary Disease Contact information: [place] [address] [phone]        Azzie Glatter, FNP Follow up on 03/01/2019.   Specialty: Family Medicine Why: Your appt time is 9:20 am. Please arrive early and bring: picture ID and your current medications Contact information: [place] [address] [phone]        Erline Levine, MD. Schedule an appointment as soon as possible for a visit in 2 week(s).   Specialty: Neurosurgery Contact information: [address] [place] [address] [phone]           Patient has follow-up appointments with radiation and medical oncology.  PCP to consider outpatient GI consultation for evaluation of longstanding intermittent mild rectal bleeding.  Case management assisted patient with medications through the [facility] and regarding transportation needs.  Recommend periodically following labs (CBC & CMP) during oncology visits or through PCP.  Follow-up A1c that was added to labs from day of discharge.   Home Health: None Equipment/Devices: None  Discharge Condition: Improved and stable CODE STATUS: Full Diet recommendation: Heart healthy diet.  Discharge Diagnoses:  Active Problems:   Mass of upper lobe of left lung   Cerebellar mass   Lung mass   Brief Summary: 58 year old male, reportedly homeless and staying with his daughter for the last couple of months, incarcerated in jail multiple times in the past, PMH of recently discovered left upper lobe mass in  September 2020 that had not been evaluated yet, ongoing polysubstance abuse (cocaine, tobacco &?  Alcohol) presented to [facility] ED on 11/24 due to few weeks history of headache, gait ataxia and numbness and tingling in his hands and feet for the last few weeks.  He also reported chronic cough, night sweats and sporadic fevers and chills.  CT head and neck showed right cerebellar mass with edema and mass-effect and left upper lobe cavitary mass.  Neurosurgery and pulmonology were consulted.   Assessment & Plan:  Left upper lobe lung mass/stage IV lung cancer (NSCLC) with solitary brain metastasis.  Hospitalized September 2020 when he had CT imaging, found to have left peritonsillar abscess, 4.3 cm spiculated mass in the left upper lobe.  He was then seen by pulmonology who recommended completing antibiotics and follow-up with PET scan as outpatient in 3 to 4 weeks.  Patient however did not arrange follow-up appointment.  Pulmonology was consulted for expedited biopsy.  S/p fiberoptic bronchoscopy with biopsy, brushings and BAL from [facility] on 11/27.  Pathology result as noted below.  He is now confirmed to have stage IV cancer of his left upper lung with a solitary brain metastasis.  As per radiation oncology and neurosurgery input, for non-small cell cancer, would consider SRS without resection for his brain lesion.   I have communicated with Dr. Tammi Klippel, radiation oncology who has coordinated outpatient follow-up at the cancer center for radiation and medical oncology and has cleared patient for discharge home.  QuantiFERON-TB gold plus: Indeterminate.  Outpatient follow-up with pulmonology.  Right cerebellar mass/Solitary metastasis  Neurosurgery consulted, recommended expedited lung mass biopsy which  was done on 11/27.  Treated in the hospital with Decadron 4 mg IV every 6 hours.  Briefly on Keppra 500 mg twice daily for seizure prophylaxis  Edema around the mass had reduced on recent MRI  11/30.  I discussed with Dr. Dierdre Harness, neurosurgery on 12/2 who recommended changing Decadron to 4 mg p.o. 3 times daily, okay to DC Keppra (no history of seizures and low likelihood).  He advised that patient could be discharged home for outpatient follow-up with oncology and neurosurgery as outpatient.  Tobacco abuse  Reports that he smokes 1.5 pack of cigarettes per day for the last 30-40 years.  Cessation counseled.  Has not needed nicotine patch.  Cocaine abuse  Patient reports that he smoked cocaine 3 weeks prior to admission.  Abstinence counseled.  Alcohol use  Reports infrequent alcohol use.  No withdrawal symptoms noted.  Leukocytosis  Likely related to steroids.  Low index of suspicion for infectious etiology.  Homelessness  Patient reports that he will be staying with his daughter upon discharge which was confirmed with his daughter.  Case management consulted  Intermittent mild rectal bleeding, chronic  Patient reports several months history of intermittent bright red bleeding per rectum, may be streaks or about spoonfuls with associated rectal pain at times but not constipated.  No further events since brief episode last night.  Not on blood thinners at discharge.  DD: Anal fissures, hemorrhoids, diverticulosis versus others.  Reports that he has never had a screening colonoscopy.  Recommended stool softeners.  Outpatient GI consultation for further evaluation including screening colonoscopy.  Patient verbalized understanding.  Prediabetes  A1c 6.2 in July 2009.  Glycemic control reasonable as noted below by CBGs despite being on steroids.  Added A1c to this morning's labs which can be followed up as outpatient.   Consultants:  Neurosurgery Pulmonology Radiation Oncology  Procedures:  Navigational bronchoscopy with biopsies 11/27  Pathology:  FINAL MICROSCOPIC DIAGNOSIS:   A. LUNG, LEFT UPPER LOBE, BIOPSY:  - Poorly differentiated  non-small cell carcinoma. See comment   COMMENT:   Immunohistochemical stains show that the tumor cells are positive for  CK7 and pancytokeratin (AE1/AE3); and negative for TTF-1, Napsin-A, p63,  CK20, D2-40, calretinin, CD31, CD56 and synaptophysin. This  immunophenotype is consistent with above interpretation. Dr. Jeannie Done  has reviewed the case and concurs with the diagnosis. Dr. Valeta Harms was  notified on 01/20/2019.   Discharge Instructions  Discharge Instructions    Call MD for:  difficulty breathing, headache or visual disturbances   Complete by: As directed    Call MD for:  extreme fatigue   Complete by: As directed    Call MD for:  persistant dizziness or light-headedness   Complete by: As directed    Call MD for:  persistant nausea and vomiting   Complete by: As directed    Call MD for:  severe uncontrolled pain   Complete by: As directed    Call MD for:  temperature >100.4   Complete by: As directed    Diet - low sodium heart healthy   Complete by: As directed    Increase activity slowly   Complete by: As directed        Medication List    STOP taking these medications   methocarbamol 500 MG tablet Commonly known as: ROBAXIN     TAKE these medications   acetaminophen 325 MG tablet Commonly known as: TYLENOL Take 2 tablets (650 mg total) by mouth every 6 (six) hours as  needed for mild pain, moderate pain or headache.   albuterol 108 (90 Base) MCG/ACT inhaler Commonly known as: VENTOLIN HFA Inhale 2 puffs into the lungs every 6 (six) hours as needed for wheezing or shortness of breath.   dexamethasone 4 MG tablet Commonly known as: DECADRON Take 1 tablet (4 mg total) by mouth 3 (three) times daily.   docusate sodium 100 MG capsule Commonly known as: COLACE Take 1 capsule (100 mg total) by mouth 2 (two) times daily.   famotidine 20 MG tablet Commonly known as: PEPCID Take 1 tablet (20 mg total) by mouth 2 (two) times daily.   folic acid 1 MG  tablet Commonly known as: FOLVITE Take 1 tablet (1 mg total) by mouth daily. Start taking on: January 22, 2019   multivitamin with minerals Tabs tablet Take 1 tablet by mouth daily. Start taking on: January 22, 2019   thiamine 100 MG tablet Take 1 tablet (100 mg total) by mouth daily. Start taking on: January 22, 2019      No Known Allergies    Procedures/Studies: Ct Angio Head W/cm &/or Wo Cm  Result Date: 01/12/2019 CLINICAL DATA:  Rule out subarachnoid hemorrhage. Headache for several weeks. Lung mass. EXAM: CT ANGIOGRAPHY HEAD AND NECK TECHNIQUE: Multidetector CT imaging of the head and neck was performed using the standard protocol during bolus administration of intravenous contrast. Multiplanar CT image reconstructions and MIPs were obtained to evaluate the vascular anatomy. Carotid stenosis measurements (when applicable) are obtained utilizing NASCET criteria, using the distal internal carotid diameter as the denominator. CONTRAST:  49m OMNIPAQUE IOHEXOL 350 MG/ML SOLN COMPARISON:  CT head 01/05/2019 FINDINGS: CT HEAD FINDINGS Brain: Hypodensity right cerebellum with mild progression. This measures approximately 3 cm. There is progressive edema and mass-effect on the fourth ventricle. No hemorrhage. Ventricle size normal. No midline shift. Vascular: Negative for hyperdense vessel. Skull: Negative Sinuses: Negative Orbits: Negative Review of the MIP images confirms the above findings CTA NECK FINDINGS Aortic arch: Standard branching. Imaged portion shows no evidence of aneurysm or dissection. No significant stenosis of the major arch vessel origins. Right carotid system: Right carotid widely patent. Mild atherosclerotic disease right carotid bulb. Left carotid system: Left carotid widely patent. Mild atherosclerotic disease left carotid bulb. Vertebral arteries: Both vertebral arteries are patent to the basilar without stenosis or irregularity Skeleton: No acute spinal abnormality. Poor  dentition with extensive caries and periapical lucencies due to dental infections. Other neck: Negative for mass or adenopathy Upper chest: 5 cm left upper lobe mass with mediastinal adenopathy consistent with carcinoma of the lung. The mass and adenopathy have progressed since chest CT 11/11/2018 Review of the MIP images confirms the above findings CTA HEAD FINDINGS Anterior circulation: Cavernous carotid patent bilaterally without stenosis. Anterior and middle cerebral arteries patent bilaterally without stenosis or occlusion Posterior circulation: Both vertebral arteries patent to the basilar. PICA patent bilaterally. Basilar patent. Superior cerebellar and posterior cerebral arteries patent bilaterally without stenosis. Venous sinuses: Normal venous enhancement. Right cerebellar hypodensity does not show significant enhancement by CT however this is arterial phase scanning. No delayed imaging performed of the head. Review of the MIP images confirms the above findings IMPRESSION: 3 cm hypodensity right cerebellum appears larger with increased mass effect compared to the recent head CT of 01/05/2019. Given the left upper lobe lung mass, this is most likely metastatic disease. Recommend MRI brain without with contrast. No significant intracranial or extracranial arterial stenosis 5 cm left upper lobe mass with adenopathy, this has  progressed since chest CT of 11/11/2018 These results were called by telephone at the time of interpretation on 01/12/2019 at 6:07 pm to provider Nanda Quinton , who verbally acknowledged these results. Electronically Signed   By: Franchot Gallo M.D.   On: 01/12/2019 18:07   Ct Head Wo Contrast  Result Date: 01/05/2019 CLINICAL DATA:  Headaches for 4 days.  Left-sided back pain. EXAM: CT HEAD WITHOUT CONTRAST TECHNIQUE: Contiguous axial images were obtained from the base of the skull through the vertex without intravenous contrast. COMPARISON:  None. FINDINGS: Brain: No evidence of  acute infarction, hemorrhage, hydrocephalus, extra-axial collection or mass lesion/mass effect. Vascular: No hyperdense vessel or unexpected calcification. Skull: No osseous abnormality. Sinuses/Orbits: Visualized paranasal sinuses are clear. Visualized mastoid sinuses are clear. Visualized orbits demonstrate no focal abnormality. Other: None IMPRESSION: No acute intracranial pathology. Electronically Signed   By: Kathreen Devoid   On: 01/05/2019 10:47   Ct Angio Neck W And/or Wo Contrast  Result Date: 01/12/2019 CLINICAL DATA:  Rule out subarachnoid hemorrhage. Headache for several weeks. Lung mass. EXAM: CT ANGIOGRAPHY HEAD AND NECK TECHNIQUE: Multidetector CT imaging of the head and neck was performed using the standard protocol during bolus administration of intravenous contrast. Multiplanar CT image reconstructions and MIPs were obtained to evaluate the vascular anatomy. Carotid stenosis measurements (when applicable) are obtained utilizing NASCET criteria, using the distal internal carotid diameter as the denominator. CONTRAST:  50m OMNIPAQUE IOHEXOL 350 MG/ML SOLN COMPARISON:  CT head 01/05/2019 FINDINGS: CT HEAD FINDINGS Brain: Hypodensity right cerebellum with mild progression. This measures approximately 3 cm. There is progressive edema and mass-effect on the fourth ventricle. No hemorrhage. Ventricle size normal. No midline shift. Vascular: Negative for hyperdense vessel. Skull: Negative Sinuses: Negative Orbits: Negative Review of the MIP images confirms the above findings CTA NECK FINDINGS Aortic arch: Standard branching. Imaged portion shows no evidence of aneurysm or dissection. No significant stenosis of the major arch vessel origins. Right carotid system: Right carotid widely patent. Mild atherosclerotic disease right carotid bulb. Left carotid system: Left carotid widely patent. Mild atherosclerotic disease left carotid bulb. Vertebral arteries: Both vertebral arteries are patent to the basilar  without stenosis or irregularity Skeleton: No acute spinal abnormality. Poor dentition with extensive caries and periapical lucencies due to dental infections. Other neck: Negative for mass or adenopathy Upper chest: 5 cm left upper lobe mass with mediastinal adenopathy consistent with carcinoma of the lung. The mass and adenopathy have progressed since chest CT 11/11/2018 Review of the MIP images confirms the above findings CTA HEAD FINDINGS Anterior circulation: Cavernous carotid patent bilaterally without stenosis. Anterior and middle cerebral arteries patent bilaterally without stenosis or occlusion Posterior circulation: Both vertebral arteries patent to the basilar. PICA patent bilaterally. Basilar patent. Superior cerebellar and posterior cerebral arteries patent bilaterally without stenosis. Venous sinuses: Normal venous enhancement. Right cerebellar hypodensity does not show significant enhancement by CT however this is arterial phase scanning. No delayed imaging performed of the head. Review of the MIP images confirms the above findings IMPRESSION: 3 cm hypodensity right cerebellum appears larger with increased mass effect compared to the recent head CT of 01/05/2019. Given the left upper lobe lung mass, this is most likely metastatic disease. Recommend MRI brain without with contrast. No significant intracranial or extracranial arterial stenosis 5 cm left upper lobe mass with adenopathy, this has progressed since chest CT of 11/11/2018 These results were called by telephone at the time of interpretation on 01/12/2019 at 6:07 pm to provider JNanda Quinton,  who verbally acknowledged these results. Electronically Signed   By: Franchot Gallo M.D.   On: 01/12/2019 18:07   Ct Chest W Contrast  Result Date: 01/12/2019 CLINICAL DATA:  Known left upper lobe lung mass EXAM: CT CHEST, ABDOMEN, AND PELVIS WITH CONTRAST TECHNIQUE: Multidetector CT imaging of the chest, abdomen and pelvis was performed following the  standard protocol during bolus administration of intravenous contrast. CONTRAST:  173m OMNIPAQUE IOHEXOL 300 MG/ML  SOLN COMPARISON:  11/11/2018 CT chest, 11/12/2018 CT abdomen and pelvis and 11/13/2018 MRI of the abdomen. FINDINGS: CT CHEST FINDINGS Cardiovascular: Thoracic aorta and its branches are within normal limits. No cardiac enlargement is seen. No pulmonary emboli are seen although opacification is poor of the pulmonary artery. No coronary calcifications are seen. No pericardial effusion is noted. Mediastinum/Nodes: Thoracic inlet is not well visualized due to a technical abnormality within the imaging plane. No mediastinal adenopathy is noted. There is a centrally necrotic lymph node identified in the left hilum measuring 13.6 mm in short axis. The esophagus as visualized is unremarkable. Lungs/Pleura: The right lung demonstrates no focal abnormality. The left lung again demonstrates the previously seen mass lesion with displacement of the bronchial tree. This lies along the major fissure and now measures approximately 5.9 x 3.9 cm increased from the prior exam at which time it measured 4.1 x 2.9 cm. Additionally deforms the major fissure more than that seen on the prior exam consistent with the focal mass effect. Additionally there is extension of the lesion through the major fissure into the superior segment of the left lower lobe best seen on image number 26 of series 4. Some increasing interstitial thickening is noted adjacent to the mass lesion likely representing some lymphangitic spread of tumor. Additionally significant central necrosis is now seen. Musculoskeletal: No rib lesions are noted although the superior aspect of the chest again is not visualized on this exam. Mild degenerative changes of the thoracic spine are seen. No acute bony lesion is noted. CT ABDOMEN PELVIS FINDINGS Hepatobiliary: The liver again demonstrates scattered hypodensities some which demonstrate peripheral enhancement  consistent with hemangiomas. These are stable from the prior CT examination. Gallbladder is incompletely distended. Some pericholecystic fluid attenuation is noted stable from the prior exam. This was well visualized on prior CT and MR and felt to be related to adenomyomatosis. Pancreas: Unremarkable. No pancreatic ductal dilatation or surrounding inflammatory changes. Spleen: Normal in size without focal abnormality. Adrenals/Urinary Tract: Adrenal glands are within normal limits. The kidneys again demonstrate a normal enhancement pattern. Scattered small cysts are seen bilaterally stable from the prior CT. No ureteral calculi are seen. The bladder is well distended with contrast enhanced urine. Stomach/Bowel: The colon is well visualized without obstructive or inflammatory changes. The appendix is well visualized. No small bowel or gastric abnormality is seen. Vascular/Lymphatic: No significant lymphadenopathy is noted. Scattered atherosclerotic changes of the aorta and iliac vessels are seen. Reproductive: Prostate is unremarkable. Other: No abdominal wall hernia or abnormality. No abdominopelvic ascites. Musculoskeletal: No acute or significant osseous findings. IMPRESSION: Enlarging left upper lobe mass lesion with significant increased central necrosis consistent with enlarging pulmonary neoplasm. There is now extension of the lesion across the major fissure into the superior segment of the left as well as changes consistent with lymphangitic spread of carcinoma. Central left hilar adenopathy with necrosis is seen new from the prior exam. The dominant node measures 13.6 mm within the left hilum and is new from the prior exam. These changes correspond with that  seen on recent MRI and CT suspicious for metastatic disease. No intra-abdominal metastatic disease is noted. Chronic changes are noted within the kidneys and liver as well as the gallbladder similar to that seen on prior CT and MRI examination.  Electronically Signed   By: Inez Catalina M.D.   On: 01/12/2019 20:55   Mr Jeri Cos JK Contrast  Result Date: 01/18/2019 CLINICAL DATA:  58 year old male with enlarging left upper lung mass and right cerebellar presume solitary brain metastasis on recent MRI. Punctate diffusion abnormality in the left anterior frontal lobe at that time without enhancement. Study for stereotactic treatment planning. EXAM: MRI HEAD WITHOUT AND WITH CONTRAST TECHNIQUE: Multiplanar, multiecho pulse sequences of the brain and surrounding structures were obtained without and with intravenous contrast. CONTRAST:  88m GADAVIST GADOBUTROL 1 MMOL/ML IV SOLN COMPARISON:  Brain MRI 01/13/2019. FINDINGS: Brain: Persistent oval rim enhancing mass in the posterosuperior right cerebellar hemisphere, although with decreased intensity of peripheral enhancement compared to 01/13/2019 (series 12, image 17 today versus series 10, image 35 previously). Size remains stable at 22 millimeters. Cerebellar edema has mildly regressed. Fourth ventricle patency appears improved. Basilar cisterns remain patent. The lesion is visible on diffusion as before. No other abnormal intracranial enhancement identified. No dural thickening. Unchanged punctate DWI lesion in the anterior left frontal lobe white matter on series 3, image 40. This remains without enhancement. But there is now faint FLAIR hyperintense correlate on series 6, image 41. Stable gray and white matter signal elsewhere. No other restricted diffusion. No midline shift, ventriculomegaly, extra-axial collection or acute intracranial hemorrhage. Cervicomedullary junction and pituitary are within normal limits. Vascular: Major intracranial vascular flow voids are stable, the left vertebral artery appears somewhat dominant. The major dural venous sinuses are enhancing and appear to be patent. Skull and upper cervical spine: Negative visible cervical spine and spinal cord. Visualized bone marrow signal is  within normal limits. Sinuses/Orbits: Stable and negative. Other: Mastoids remain clear. Visible internal auditory structures appear normal. Stable right face superficial sebaceous type cyst. IMPRESSION: 1. Right cerebellar mass with regression of regional edema and intensity of enhancement compared to 01/13/2019. This remains most compatible with solitary brain metastasis. 2. No other abnormal enhancement or metastatic disease identified. The punctate left frontal lobe white matter DWI lesion remains nonenhancing but now has faint correlation on FLAIR imaging, favoring punctate infarct. 3. No new intracranial abnormality. Electronically Signed   By: HGenevie AnnM.D.   On: 01/18/2019 23:28   Mr BJeri CosAnd Wo Contrast  Result Date: 01/13/2019 CLINICAL DATA:  58year old male with persistent headache. Progressive left upper lung mass, abnormal hypodensity in the right cerebellum on CT. EXAM: MRI HEAD WITHOUT AND WITH CONTRAST TECHNIQUE: Multiplanar, multiecho pulse sequences of the brain and surrounding structures were obtained without and with intravenous contrast. CONTRAST:  7.5107mGADAVIST GADOBUTROL 1 MMOL/ML IV SOLN COMPARISON:  CTA head and neck 01/12/2019, CT head 01/05/2019. FINDINGS: Brain: Approximately 24 millimeter mostly rim enhancing mass located in the right superior cerebellum corresponds to the hypodense lesion by CT. This has spiculated margins (series 10, image 35) and is superficially located, raising the possibility of some leptomeningeal involvement. Confluent cerebellar edema which crosses midline (series 5, image 17). Subsequent partially compressed 4th ventricle. Although the lateral and 3rd ventricles remain normal without transependymal edema. Subsequent crowding of the posterior fossa with mild effacement of the cisterna magna. No overt tonsillar herniation at this time. No other enhancing brain mass or abnormal intracranial enhancement identified. No pachymeningeal thickening.  The  enhancing mass is visible on diffusion weighted imaging, and additionally there is a punctate area of abnormal trace diffusion in the left anterior frontal lobe white matter on series 2, image 39. This might be restricted on ADC (series 250 image 39) but there is no associated enhancement, and no discrete T2 or FLAIR correlate. No other abnormal diffusion. Minimal nonspecific cerebral white matter T2 and FLAIR hyperintensity elsewhere. No cortical encephalomalacia or chronic cerebral blood products. Suprasellar and interpeduncular cisterns remain normal. No superimposed midline shift, extra-axial collection or acute intracranial hemorrhage. Pituitary within normal limits. Vascular: Major intracranial vascular flow voids are preserved. The major dural venous sinuses are enhancing and appear to be patent. Skull and upper cervical spine: Negative visible cervical spine and spinal cord. Visualized bone marrow signal is within normal limits. Sinuses/Orbits: Negative orbits. Trace paranasal sinus mucosal thickening. Other: Mastoids are clear. Visible internal auditory structures appear normal. Benign appearing right lateral face sebaceous cyst or similar lesion. Otherwise negative visible scalp and face soft tissues. IMPRESSION: 1. Solitary 24 mm enhancing mass in the right superior cerebellum most compatible with a solitary brain metastasis in this setting. Superficial position and spiculated margins such that early leptomeningeal involvement is not excluded. Confluent cerebellar edema which crosses midline. Mass effect in the posterior fossa, including compression of the 4th ventricle and crowding of the cisterna magna, but no ventriculomegaly or transependymal edema. 2. Superimposed punctate diffusion abnormality in the left anterior frontal lobe white matter with no associated enhancement, T2 or FLAIR signal. This is nonspecific at this time, but punctate lacunar infarct is currently favored over early metastasis.  Attention directed on follow-up. 3. No other metastatic disease or acute intracranial abnormality identified. Electronically Signed   By: Genevie Ann M.D.   On: 01/13/2019 04:35   Ct Abdomen Pelvis W Contrast  Result Date: 01/12/2019 CLINICAL DATA:  Known left upper lobe lung mass EXAM: CT CHEST, ABDOMEN, AND PELVIS WITH CONTRAST TECHNIQUE: Multidetector CT imaging of the chest, abdomen and pelvis was performed following the standard protocol during bolus administration of intravenous contrast. CONTRAST:  15m OMNIPAQUE IOHEXOL 300 MG/ML  SOLN COMPARISON:  11/11/2018 CT chest, 11/12/2018 CT abdomen and pelvis and 11/13/2018 MRI of the abdomen. FINDINGS: CT CHEST FINDINGS Cardiovascular: Thoracic aorta and its branches are within normal limits. No cardiac enlargement is seen. No pulmonary emboli are seen although opacification is poor of the pulmonary artery. No coronary calcifications are seen. No pericardial effusion is noted. Mediastinum/Nodes: Thoracic inlet is not well visualized due to a technical abnormality within the imaging plane. No mediastinal adenopathy is noted. There is a centrally necrotic lymph node identified in the left hilum measuring 13.6 mm in short axis. The esophagus as visualized is unremarkable. Lungs/Pleura: The right lung demonstrates no focal abnormality. The left lung again demonstrates the previously seen mass lesion with displacement of the bronchial tree. This lies along the major fissure and now measures approximately 5.9 x 3.9 cm increased from the prior exam at which time it measured 4.1 x 2.9 cm. Additionally deforms the major fissure more than that seen on the prior exam consistent with the focal mass effect. Additionally there is extension of the lesion through the major fissure into the superior segment of the left lower lobe best seen on image number 26 of series 4. Some increasing interstitial thickening is noted adjacent to the mass lesion likely representing some  lymphangitic spread of tumor. Additionally significant central necrosis is now seen. Musculoskeletal: No rib lesions are noted  although the superior aspect of the chest again is not visualized on this exam. Mild degenerative changes of the thoracic spine are seen. No acute bony lesion is noted. CT ABDOMEN PELVIS FINDINGS Hepatobiliary: The liver again demonstrates scattered hypodensities some which demonstrate peripheral enhancement consistent with hemangiomas. These are stable from the prior CT examination. Gallbladder is incompletely distended. Some pericholecystic fluid attenuation is noted stable from the prior exam. This was well visualized on prior CT and MR and felt to be related to adenomyomatosis. Pancreas: Unremarkable. No pancreatic ductal dilatation or surrounding inflammatory changes. Spleen: Normal in size without focal abnormality. Adrenals/Urinary Tract: Adrenal glands are within normal limits. The kidneys again demonstrate a normal enhancement pattern. Scattered small cysts are seen bilaterally stable from the prior CT. No ureteral calculi are seen. The bladder is well distended with contrast enhanced urine. Stomach/Bowel: The colon is well visualized without obstructive or inflammatory changes. The appendix is well visualized. No small bowel or gastric abnormality is seen. Vascular/Lymphatic: No significant lymphadenopathy is noted. Scattered atherosclerotic changes of the aorta and iliac vessels are seen. Reproductive: Prostate is unremarkable. Other: No abdominal wall hernia or abnormality. No abdominopelvic ascites. Musculoskeletal: No acute or significant osseous findings. IMPRESSION: Enlarging left upper lobe mass lesion with significant increased central necrosis consistent with enlarging pulmonary neoplasm. There is now extension of the lesion across the major fissure into the superior segment of the left as well as changes consistent with lymphangitic spread of carcinoma. Central left hilar  adenopathy with necrosis is seen new from the prior exam. The dominant node measures 13.6 mm within the left hilum and is new from the prior exam. These changes correspond with that seen on recent MRI and CT suspicious for metastatic disease. No intra-abdominal metastatic disease is noted. Chronic changes are noted within the kidneys and liver as well as the gallbladder similar to that seen on prior CT and MRI examination. Electronically Signed   By: Inez Catalina M.D.   On: 01/12/2019 20:55   Dg Chest Port 1 View  Result Date: 01/15/2019 CLINICAL DATA:  Post left video bronchoscopy with endobronchial navigational. Left lung mass. EXAM: PORTABLE CHEST 1 VIEW COMPARISON:  Most recent comparison chest CT 01/12/2019 FINDINGS: Left upper lobe mass, not significantly changed from recent CT. No visualized pneumothorax or pneumomediastinum. Normal heart size with unchanged mediastinal contours. No new airspace disease. No pleural fluid or pulmonary edema. IMPRESSION: 1. Unchanged left upper lobe mass. 2. No acute findings after bronchoscopy, no new abnormality. Electronically Signed   By: Keith Rake M.D.   On: 01/15/2019 12:11   Dg C-arm Bronchoscopy  Result Date: 01/15/2019 C-ARM BRONCHOSCOPY: Fluoroscopy was utilized by the requesting physician.  No radiographic interpretation.      Subjective: Denies complaints.  Had mild bright red bleeding per rectum last night but reportedly this is chronic, mild and intermittent of several months duration.  No further bleeding reported.  Denies dyspnea, chest pain, headache, dizziness or lightheadedness.  As per RN, no acute issues and has been ambulating comfortably without assistance or instability.  Discharge Exam:  Vitals:   01/20/19 2336 01/21/19 0348 01/21/19 0827 01/21/19 1138  BP: 118/83 110/73 122/76 127/77  Pulse: 65 66 60 66  Resp: _0 Temp: 97.8 F (36.6 C) 98.6 F (37 C) 97.7 F (36.5 C) 98.6 F (37 C)  TempSrc: Oral Oral Oral  Oral  SpO2: 99% 99% 99% 100%  Weight:      Height:  General exam: Pleasant young male, moderately built and nourished sitting up comfortably in bed without distress. Respiratory system: Clear to auscultation without wheezing, rhonchi or crackles. Respiratory effort normal. Cardiovascular system: S1 & S2 heard, RRR. No JVD, murmurs, rubs, gallops or clicks. No pedal edema.   Gastrointestinal system: Abdomen is nondistended, soft and nontender. No organomegaly or masses felt. Normal bowel sounds heard. Central nervous system: Alert and oriented. No focal neurological deficits. Extremities: Symmetric 5 x 5 power. Skin: No rashes, lesions or ulcers Psychiatry: Judgement and insight appear normal. Mood & affect appropriate.    The results of significant diagnostics from this hospitalization (including imaging, microbiology, ancillary and laboratory) are listed below for reference.     Microbiology: Recent Results (from the past 240 hour(s))  SARS Coronavirus 2 by RT PCR (hospital order, performed in [facility] hospital lab) Nasopharyngeal Nasopharyngeal Swab     Status: None   Collection Time: 01/12/19  7:29 PM   Specimen: Nasopharyngeal Swab  Result Value Ref Range Status   SARS Coronavirus 2 NEGATIVE NEGATIVE Final    Comment: (NOTE) SARS-CoV-2 target nucleic acids are NOT DETECTED. The SARS-CoV-2 RNA is generally detectable in upper and lower respiratory specimens during the acute phase of infection. The lowest concentration of SARS-CoV-2 viral copies this assay can detect is 250 copies / mL. A negative result does not preclude SARS-CoV-2 infection and should not be used as the sole basis for treatment or other patient management decisions.  A negative result may occur with improper specimen collection / handling, submission of specimen other than nasopharyngeal swab, presence of viral mutation(s) within the areas targeted by this assay, and inadequate number of viral  copies (<250 copies / mL). A negative result must be combined with clinical observations, patient history, and epidemiological information. Fact Sheet for Patients:   StrictlyIdeas.no Fact Sheet for Healthcare Providers: BankingDealers.co.za This test is not yet approved or cleared  by the [place] FDA and has been authorized for detection and/or diagnosis of SARS-CoV-2 by FDA under an Emergency Use Authorization (EUA).  This EUA will remain in effect (meaning this test can be used) for the duration of the COVID-19 declaration under Section 564(b)(1) of the Act, 21 U.S.C. section 360bbb-3(b)(1), unless the authorization is terminated or revoked sooner. Performed at [facility], [place] [address], [place], [place] [address]   SARS CORONAVIRUS 2 (TAT 6-24 HRS) Nasopharyngeal Nasopharyngeal Swab     Status: None   Collection Time: 01/17/19  7:35 PM   Specimen: Nasopharyngeal Swab  Result Value Ref Range Status   SARS Coronavirus 2 NEGATIVE NEGATIVE Final    Comment: (NOTE) SARS-CoV-2 target nucleic acids are NOT DETECTED. The SARS-CoV-2 RNA is generally detectable in upper and lower respiratory specimens during the acute phase of infection. Negative results do not preclude SARS-CoV-2 infection, do not rule out co-infections with other pathogens, and should not be used as the sole basis for treatment or other patient management decisions. Negative results must be combined with clinical observations, patient history, and epidemiological information. The expected result is Negative. Fact Sheet for Patients: SugarRoll.be Fact Sheet for Healthcare Providers: https://www.woods-mathews.com/ This test is not yet approved or cleared by the [place] FDA and  has been authorized for detection and/or diagnosis of SARS-CoV-2 by FDA under an Emergency Use Authorization (EUA). This EUA will remain   in effect (meaning this test can be used) for the duration of the COVID-19 declaration under Section 56 4(b)(1) of the Act, 21 U.S.C. section  360bbb-3(b)(1), unless the authorization is terminated or revoked sooner. Performed at [facility], [place] [address], [place], [place] [address]      Labs: CBC: Recent Labs  Lab 01/15/19 0249 01/16/19 0341 01/17/19 0718 01/18/19 0406 01/21/19 0341  WBC 12.5* 13.3* 15.7* 17.9* 22.2*  NEUTROABS  --  10.6* 11.7* 13.6*  --   HGB 11.9* 12.2* 12.5* 12.4* 12.2*  HCT 36.6* 37.1* 38.6* 37.4* 36.9*  MCV 85.1 84.9 85.6 84.2 85.8  PLT 617* 546* 601* 568* 515*    Basic Metabolic Panel: Recent Labs  Lab 01/16/19 0341 01/17/19 0718 01/18/19 0406 01/21/19 0341  NA 139 137 134* 136  K 4.1 4.4 4.2 4.1  CL 99 100 97* 98  CO2 _0 GLUCOSE 162* 110* 135* 162*  BUN _1 CREATININE 0.80 0.68 0.82 0.81  CALCIUM 8.8* 9.0 8.9 8.6*    Liver Function Tests: Recent Labs  Lab 01/18/19 0406 01/21/19 0341  AST 34 33  ALT 99* 91*  ALKPHOS 78 87  BILITOT 0.2* 0.5  PROT 6.5 5.8*  ALBUMIN 2.4* 2.4*    CBG: Recent Labs  Lab 01/17/19 0601 01/18/19 0628 01/19/19 0608 01/20/19 0605 01/21/19 0556  GLUCAP 113* 112* 130* 138* 115*     I discussed in detail with patient's daughter via phone, updated care and answered all questions.  Time coordinating discharge: 45 minutes  SIGNED:  Vernell Leep, MD, [place], [facility]. [facility]  To contact the attending provider between 7A-7P or the covering provider during after hours 7P-7A, please log into the web site [email or web address] and access using universal [place] password for that web site. If you do not have the password, please call the hospital operator.

## 2019-01-21 NOTE — Discharge Instructions (Signed)
Flexible Bronchoscopy, Care After This sheet gives you information about how to care for yourself after your test. Your doctor may also give you more specific instructions. If you have problems or questions, contact your doctor. Follow these instructions at home: Eating and drinking.  The day after the test, go back to your normal diet. Driving  Do not drive for 24 hours if you were given a medicine to help you relax (sedative).  Do not drive or use heavy machinery while taking prescription pain medicine. General instructions   Take over-the-counter and prescription medicines only as told by your doctor.  Return to your normal activities as told. Ask what activities are safe for you.  Do not use any products that have nicotine or tobacco in them. This includes cigarettes and e-cigarettes. If you need help quitting, ask your doctor.  Keep all follow-up visits as told by your doctor. This is important. It is very important if you had a tissue sample (biopsy) taken. Get help right away if:  You have shortness of breath that gets worse.  You get light-headed.  You feel like you are going to pass out (faint).  You have chest pain.  You cough up: ? More than a little blood. ? More blood than before. Summary  Do not eat or drink anything (not even water) for 2 hours after your test, or until your numbing medicine wears off.  Do not use cigarettes. Do not use e-cigarettes.  Get help right away if you have chest pain. This information is not intended to replace advice given to you by your health care provider. Make sure you discuss any questions you have with your health care provider. Document Released: 12/02/2008 Document Revised: 01/17/2017 Document Reviewed: 02/23/2016 Elsevier Patient Education  2020 [employer].    Additional discharge instructions  Please get your medications reviewed and adjusted by your Primary MD.  Please request your Primary MD to go over all  Hospital Tests and Procedure/Radiological results at the follow up, please get all Hospital records sent to your Prim MD by signing hospital release before you go home.  If you had Pneumonia of Lung problems at the Hospital: Please get a 2 view Chest X ray done in 6-8 weeks after hospital discharge or sooner if instructed by your Primary MD.  If you have Congestive Heart Failure: Please call your Cardiologist or Primary MD anytime you have any of the following symptoms:  1) 3 pound weight gain in 24 hours or 5 pounds in 1 week  2) shortness of breath, with or without a dry hacking cough  3) swelling in the hands, feet or stomach  4) if you have to sleep on extra pillows at night in order to breathe  Follow cardiac low salt diet and 1.5 lit/day fluid restriction.  If you have diabetes Accuchecks 4 times/day, Once in AM empty stomach and then before each meal. Log in all results and show them to your primary doctor at your next visit. If any glucose reading is under 80 or above 300 call your primary MD immediately.  If you have Seizure/Convulsions/Epilepsy: Please do not drive, operate heavy machinery, participate in activities at heights or participate in high speed sports until you have seen by Primary MD or a Neurologist and advised to do so again.  If you had Gastrointestinal Bleeding: Please ask your Primary MD to check a complete blood count within one week of discharge or at your next visit. Your endoscopic/colonoscopic biopsies that are  pending at the time of discharge, will also need to followed by your Primary MD.  Get Medicines reviewed and adjusted. Please take all your medications with you for your next visit with your Primary MD  Please request your Primary MD to go over all hospital tests and procedure/radiological results at the follow up, please ask your Primary MD to get all Hospital records sent to his/her office.  If you experience worsening of your admission symptoms,  develop shortness of breath, life threatening emergency, suicidal or homicidal thoughts you must seek medical attention immediately by calling 911 or calling your MD immediately  if symptoms less severe.  You must read complete instructions/literature along with all the possible adverse reactions/side effects for all the Medicines you take and that have been prescribed to you. Take any new Medicines after you have completely understood and accpet all the possible adverse reactions/side effects.   Do not drive or operate heavy machinery when taking Pain medications.   Do not take more than prescribed Pain, Sleep and Anxiety Medications  Special Instructions: If you have smoked or chewed Tobacco  in the last 2 yrs please stop smoking, stop any regular Alcohol  and or any Recreational drug use.  Wear Seat belts while driving.  Please note You were cared for by a hospitalist during your hospital stay. If you have any questions about your discharge medications or the care you received while you were in the hospital after you are discharged, you can call the unit and asked to speak with the hospitalist on call if the hospitalist that took care of you is not available. Once you are discharged, your primary care physician will handle any further medical issues. Please note that NO REFILLS for any discharge medications will be authorized once you are discharged, as it is imperative that you return to your primary care physician (or establish a relationship with a primary care physician if you do not have one) for your aftercare needs so that they can reassess your need for medications and monitor your lab values.  You can reach the hospitalist office at phone [phone] or fax [phone]   If you do not have a primary care physician, you can call [phone] for a physician referral.

## 2019-01-21 NOTE — Progress Notes (Signed)
Patient is discharging home. All discharge paperwork went over with patient. All questions and concerns addressed. Patient to be discharged with all belongings. [employer] called for transport home. TOC delivered medications. Patient to be taken down in wheelchair. [place]

## 2019-01-21 NOTE — TOC Transition Note (Addendum)
Transition of Care [facility]) - CM/SW Discharge Note   Patient Details  Name: Micheal Vincent MRN: [id number] Date of Birth: 02-01-1961  [facility] CM/SW Contact:  Pollie Friar, RN Phone Number: 01/21/2019, 2:48 PM   Clinical Narrative:    Pt is discharging home and will have outpatient f/u through [facility].  CM was able to get him a PCP appt and placed the information on the AVS.  Discharge medications to be delivered to the room per [place]. CM did a MATCH with co pays covered.  Pt with transportation issues today and for future appts. CM arranged through transportation services at [facility] that rides will be arranged for the patient.    Pt has signed form for [employer] and will transport home via their services.    Final next level of care: Home/Self Care Barriers to Discharge: Inadequate or no insurance, Barriers Unresolved (comment)   Patient Goals and CMS Choice        Discharge Placement                       Discharge Plan and Services In-house Referral: Clinical Social Work Discharge Planning Services: CM Consult                                 Social Determinants of Health (SDOH) Interventions     Readmission Risk Interventions No flowsheet data found.

## 2019-01-22 ENCOUNTER — Other Ambulatory Visit: Payer: Self-pay

## 2019-01-22 ENCOUNTER — Encounter: Payer: Self-pay | Admitting: *Deleted

## 2019-01-22 ENCOUNTER — Encounter: Payer: Self-pay | Admitting: Radiation Oncology

## 2019-01-22 ENCOUNTER — Ambulatory Visit
Admission: RE | Admit: 2019-01-22 | Discharge: 2019-01-22 | Disposition: A | Discharge status: Home / Self Care | Payer: Medicaid Other | Source: Ambulatory Visit | Attending: Radiation Oncology | Admitting: Radiation Oncology

## 2019-01-22 VITALS — BP 144/79 | HR 67 | Temp 98.2°F | Resp 24

## 2019-01-22 DIAGNOSIS — C7931 Secondary malignant neoplasm of brain: Secondary | ICD-10-CM

## 2019-01-22 DIAGNOSIS — C3412 Malignant neoplasm of upper lobe, left bronchus or lung: Secondary | ICD-10-CM | POA: Diagnosis present

## 2019-01-22 DIAGNOSIS — C7949 Secondary malignant neoplasm of other parts of nervous system: Secondary | ICD-10-CM

## 2019-01-22 DIAGNOSIS — Z510 Encounter for antineoplastic radiation therapy: Secondary | ICD-10-CM | POA: Diagnosis not present

## 2019-01-22 NOTE — Progress Notes (Signed)
  Radiation Oncology         [phone] ________________________________  Name: Prestyn Stanco MRN: [id number]  Date: 01/19/2019  DOB: [birth year]  SIMULATION AND TREATMENT PLANNING NOTE    ICD-10-CM   1. 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.  Identity was confirmed.  All relevant records and images related to the planned course of therapy were reviewed.  The patient freely provided informed written consent to proceed with treatment after reviewing the details related to the planned course of therapy. The consent form was witnessed and verified by the simulation staff. Intravenous access was established for contrast administration. Then, the patient was set-up in a stable reproducible supine position for radiation therapy.  A relocatable thermoplastic stereotactic head frame was fabricated for precise immobilization.  CT images were obtained.  Surface markings were placed.  The CT images were loaded into the planning software and fused with the patient's targeting MRI scan.  Then the target and avoidance structures were contoured.  Treatment planning then occurred.  The radiation prescription was entered and confirmed.  I have requested 3D planning  I have requested a DVH of the following structures: Brain stem, brain, left eye, right eye, lenses, optic chiasm, target volumes, uninvolved brain, and normal tissue.    SPECIAL TREATMENT PROCEDURE:  The planned course of therapy using radiation constitutes a special treatment procedure. Special care is required in the management of this patient for the following reasons. This treatment constitutes a Special Treatment Procedure for the following reason: High dose per fraction requiring special monitoring for increased toxicities of treatment including  daily imaging.  The special nature of the planned course of radiotherapy will require increased physician supervision and oversight to ensure patient's safety with optimal treatment outcomes.  PLAN:  The patient will receive 18 Gy in 1 fraction.  ________________________________  Sheral Apley Tammi Klippel, M.D.

## 2019-01-22 NOTE — Progress Notes (Signed)
Received patient in the clinic following 1 of 1 intended srs treatments to large met in cerebellum. Vitals stable. Patient denies pain. Patient reports dizziness and altered gait same as yesterday. Patient endorses taking decadron 4 mg tid. No evidence of thrush. Patient endorses taking pepcid as prescribed. Patient denies nausea, vomiting, diplopia or tinnitus. Patient reports he is living with his daughter presently and enjoys walking her dog. Instructed upon the following decadron taper:  4 mg tid x 7 days then, 4 mg bid x 7 days then, 2 mg (1/2 pill) bid x 7 days then, 2 mg once a day x 7 then, STOP. Instructed patient to call Sam, RN at [phone] should he experience increased dizziness or headache while tapering off decadron.  Illustrated on calendar taper instructions and reviewed the calendar in detail with the patient. Copy of calendar scanned into EMR. Patient verbalized understanding of all reviewed.   Patient discharged home with no new neurologic symptoms and in no distress. Patient ambulated to the lobby with Carlyon Shadow, NT. [employer] scheduled.

## 2019-01-22 NOTE — Progress Notes (Addendum)
[facility]  Clinical Social Work was referred by radiation oncology for assessment of psychosocial needs.  Clinical Social Worker met with patient to offer support and assess for needs.  CSW met briefly with patient prior to [facility] treatment.  CSW assisted patient in completing paperwork for [facility] assistance.  CSW will send to [facility] and follow up with patient at a later time.   Gwinda Maine, LCSW  Clinical Social Worker [facility]

## 2019-01-22 NOTE — Op Note (Signed)
  Name: Micheal Vincent  MRN: [id number]  Date: 01/22/2019   DOB: 1960-12-06  Stereotactic Radiosurgery Operative Note  PRE-OPERATIVE DIAGNOSIS:  Solitary Brain Metastasis  POST-OPERATIVE DIAGNOSIS:  Solitary Brain Metastasis  PROCEDURE:  Stereotactic Radiosurgery  SURGEON:  Peggyann Shoals, MD  NARRATIVE: The patient underwent a radiation treatment planning session in the radiation oncology simulation suite under the care of the radiation oncology physician and physicist.  I participated closely in the radiation treatment planning afterwards. The patient underwent planning CT which was fused to 3T high resolution MRI with 1 mm axial slices.  These images were fused on the planning system.  We contoured the gross target volumes and subsequently expanded this to yield the Planning Target Volume. I actively participated in the planning process.  I helped to define and review the target contours and also the contours of the optic pathway, eyes, brainstem and selected nearby organs at risk.  All the dose constraints for critical structures were reviewed and compared to AAPM Task Group 101.  The prescription dose conformity was reviewed.  I approved the plan electronically.    Accordingly, Micheal Vincent was brought to the TrueBeam stereotactic radiation treatment linac and placed in the custom immobilization mask.  The patient was aligned according to the IR fiducial markers with BrainLab Exactrac, then orthogonal x-rays were used in ExacTrac with the 6DOF robotic table and the shifts were made to align the patient  Micheal Vincent received stereotactic radiosurgery uneventfully.    The detailed description of the procedure is recorded in the radiation oncology procedure note.  I was present for the duration of the procedure.  DISPOSITION:  Following delivery, the patient was transported to nursing in stable condition and monitored for possible acute effects to be discharged to home in stable condition with  follow-up in one month.  Peggyann Shoals, MD 01/22/2019 4:16 PM

## 2019-01-24 NOTE — Progress Notes (Signed)
  Radiation Oncology         [phone]) [phone] ________________________________  Stereotactic Treatment Procedure Note  Name: Micheal Vincent MRN: [id number]  Date: 01/22/2019  DOB: October 30, 1960  SPECIAL TREATMENT PROCEDURE    ICD-10-CM   1. Secondary malignant neoplasm of brain and spinal cord ([place])  C79.31    C79.49     3D TREATMENT PLANNING AND DOSIMETRY:  The patient's radiation plan was reviewed and approved by neurosurgery and radiation oncology prior to treatment.  It showed 3-dimensional radiation distributions overlaid onto the planning CT/MRI image set.  The [facility] for the target structures as well as the organs at risk were reviewed. The documentation of the 3D plan and dosimetry are filed in the radiation oncology EMR.  NARRATIVE:  Micheal Vincent was brought to the TrueBeam stereotactic radiation treatment machine and placed supine on the CT couch. The head frame was applied, and the patient was set up for stereotactic radiosurgery.  Neurosurgery was present for the set-up and delivery  SIMULATION VERIFICATION:  In the couch zero-angle position, the patient underwent Exactrac imaging using the Brainlab system with orthogonal KV images.  These were carefully aligned and repeated to confirm treatment position for each of the isocenters.  The Exactrac snap film verification was repeated at each couch angle.  PROCEDURE: Micheal Vincent received stereotactic radiosurgery to the following targets: Right cerebellar 2.2 cm target was treated using 3 Rapid Arc VMAT Beams to a prescription dose of 18 Gy.  ExacTrac registration was performed for each couch angle.  The 100% isodose line was prescribed.  6 MV X-rays were delivered in the flattening filter free beam mode.  STEREOTACTIC TREATMENT MANAGEMENT:  Following delivery, the patient was transported to nursing in stable condition and monitored for possible acute effects.  Vital signs were recorded BP (!) 144/79   Pulse 67   Temp 98.2 F (36.8 C)   Resp  (!) 24   SpO2 100% . The patient tolerated treatment without significant acute effects, and was discharged to home in stable condition.    PLAN: Follow-up in one month.  He was given a taper from Dexamethasone 4 mg TID to OFF over 28 days.  He has Pepcid.  ________________________________  Sheral Apley. Tammi Klippel, M.D.

## 2019-01-24 NOTE — Addendum Note (Signed)
Encounter addended by: Tyler Pita, MD on: 01/24/2019 2:19 PM  Actions taken: Clinical Note Signed

## 2019-01-25 ENCOUNTER — Telehealth: Payer: Self-pay | Admitting: *Deleted

## 2019-01-25 DIAGNOSIS — R918 Other nonspecific abnormal finding of lung field: Secondary | ICD-10-CM

## 2019-01-25 NOTE — Telephone Encounter (Signed)
Called patient back today to update on appt change.  He verbalized understanding.

## 2019-01-25 NOTE — Telephone Encounter (Signed)
Oncology Nurse Navigator Documentation  Oncology Nurse Navigator Flowsheets 01/25/2019  Navigator Location [facility]  Referral Date to RadOnc/MedOnc -  Navigator Encounter Type Telephone/I received referral on Micheal Vincent.  He is living in [place] and would like to be seen here.  I scheduled him to be seen with Dr. Julien Nordmann on 12/10 at [facility].  Patient verbalized understanding of appt time and place.  He has transportation issues but is aware of transportation services and will call for a ride to his appt.  I also updated him on his appt tomorrow.  He verbalized understanding of appts time and place.   Telephone Outgoing Call  Patient Visit Type -  Treatment Phase Pre-Tx/Tx Discussion  Barriers/Navigation Needs Coordination of Care  Interventions Coordination of Care;Education  Acuity Level 2-Minimal Needs (1-2 Barriers Identified)  Coordination of Care Appts  Education Method Verbal  Time Spent with Patient 30

## 2019-01-26 ENCOUNTER — Inpatient Hospital Stay: Payer: Medicaid Other | Attending: Hematology and Oncology | Admitting: Hematology and Oncology

## 2019-01-26 ENCOUNTER — Other Ambulatory Visit: Payer: Self-pay

## 2019-01-26 ENCOUNTER — Encounter: Payer: Self-pay | Admitting: *Deleted

## 2019-01-26 VITALS — BP 130/77 | HR 100 | Temp 97.9°F | Resp 16 | Ht 72.0 in | Wt 168.2 lb

## 2019-01-26 DIAGNOSIS — F17210 Nicotine dependence, cigarettes, uncomplicated: Secondary | ICD-10-CM | POA: Diagnosis not present

## 2019-01-26 DIAGNOSIS — C7931 Secondary malignant neoplasm of brain: Secondary | ICD-10-CM | POA: Diagnosis not present

## 2019-01-26 DIAGNOSIS — K047 Periapical abscess without sinus: Secondary | ICD-10-CM | POA: Diagnosis not present

## 2019-01-26 DIAGNOSIS — K209 Esophagitis, unspecified without bleeding: Secondary | ICD-10-CM | POA: Diagnosis not present

## 2019-01-26 DIAGNOSIS — Z79899 Other long term (current) drug therapy: Secondary | ICD-10-CM | POA: Insufficient documentation

## 2019-01-26 DIAGNOSIS — F19982 Other psychoactive substance use, unspecified with psychoactive substance-induced sleep disorder: Secondary | ICD-10-CM

## 2019-01-26 DIAGNOSIS — F1490 Cocaine use, unspecified, uncomplicated: Secondary | ICD-10-CM | POA: Diagnosis not present

## 2019-01-26 DIAGNOSIS — Z833 Family history of diabetes mellitus: Secondary | ICD-10-CM | POA: Insufficient documentation

## 2019-01-26 DIAGNOSIS — C3412 Malignant neoplasm of upper lobe, left bronchus or lung: Secondary | ICD-10-CM | POA: Insufficient documentation

## 2019-01-26 DIAGNOSIS — G935 Compression of brain: Secondary | ICD-10-CM | POA: Diagnosis not present

## 2019-01-26 DIAGNOSIS — K029 Dental caries, unspecified: Secondary | ICD-10-CM | POA: Insufficient documentation

## 2019-01-26 DIAGNOSIS — L723 Sebaceous cyst: Secondary | ICD-10-CM | POA: Insufficient documentation

## 2019-01-26 DIAGNOSIS — M25562 Pain in left knee: Secondary | ICD-10-CM | POA: Diagnosis not present

## 2019-01-26 DIAGNOSIS — Z5111 Encounter for antineoplastic chemotherapy: Secondary | ICD-10-CM | POA: Insufficient documentation

## 2019-01-26 DIAGNOSIS — M549 Dorsalgia, unspecified: Secondary | ICD-10-CM | POA: Diagnosis not present

## 2019-01-26 DIAGNOSIS — J36 Peritonsillar abscess: Secondary | ICD-10-CM | POA: Insufficient documentation

## 2019-01-26 DIAGNOSIS — Z8249 Family history of ischemic heart disease and other diseases of the circulatory system: Secondary | ICD-10-CM | POA: Insufficient documentation

## 2019-01-26 DIAGNOSIS — M25561 Pain in right knee: Secondary | ICD-10-CM | POA: Diagnosis not present

## 2019-01-26 DIAGNOSIS — I700 Atherosclerosis of aorta: Secondary | ICD-10-CM | POA: Diagnosis not present

## 2019-01-26 DIAGNOSIS — I6381 Other cerebral infarction due to occlusion or stenosis of small artery: Secondary | ICD-10-CM | POA: Insufficient documentation

## 2019-01-26 DIAGNOSIS — R0602 Shortness of breath: Secondary | ICD-10-CM | POA: Diagnosis not present

## 2019-01-26 DIAGNOSIS — R590 Localized enlarged lymph nodes: Secondary | ICD-10-CM | POA: Insufficient documentation

## 2019-01-26 DIAGNOSIS — Z809 Family history of malignant neoplasm, unspecified: Secondary | ICD-10-CM | POA: Insufficient documentation

## 2019-01-26 DIAGNOSIS — G4700 Insomnia, unspecified: Secondary | ICD-10-CM | POA: Insufficient documentation

## 2019-01-26 DIAGNOSIS — F1290 Cannabis use, unspecified, uncomplicated: Secondary | ICD-10-CM | POA: Insufficient documentation

## 2019-01-26 DIAGNOSIS — R079 Chest pain, unspecified: Secondary | ICD-10-CM | POA: Insufficient documentation

## 2019-01-26 DIAGNOSIS — C3492 Malignant neoplasm of unspecified part of left bronchus or lung: Secondary | ICD-10-CM

## 2019-01-26 MED ORDER — MIRTAZAPINE 7.5 MG PO TABS: 7.5000 mg | ORAL_TABLET | Freq: Every day | ORAL | 1 refills | Status: DC

## 2019-01-26 MED FILL — MIRTAZAPINE 7.5 MG TABLET: 7.5 | 30 days supply | Qty: 30 | Fill #0

## 2019-01-26 MED FILL — MIRTAZAPINE 7.5 MG TABLET: 7.5 | 30 days supply | Qty: 30 | Fill #1

## 2019-01-26 NOTE — Progress Notes (Signed)
[place] Work  Holiday representative met with patient in exam room at [facility] to offer support and assess for needs.  CSW and patient confirmed that paperwork had been picked up by servant center.  Patient stated he was currently living with his daughter in [place].  Patient stated his mailing address is his mothers home in [facility] [place] [facility].  Patient stated he would like to explore housing options, but felt comfortable in his current situation.  CSW provided education on the support team and CSW role.  CSW also enrolled patient in the [employer] fund, provided gift cards, and food bag from the [employer].  CSW provided patient with contact information and encouraged patient to call with questions or concerns.    Johnnye Lana, MSW, LCSW, OSW-C Clinical Social Worker [facility] [phone]

## 2019-01-26 NOTE — Progress Notes (Signed)
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 vertebral artery appears somewhat dominant. The major dural venous sinuses are enhancing and appear to be patent. Skull and upper cervical spine: Negative visible cervical spine and spinal cord. Visualized bone marrow signal is within normal limits. Sinuses/Orbits: Stable and negative. Other: Mastoids remain clear. Visible internal auditory structures appear normal. Stable right face superficial sebaceous type cyst. IMPRESSION: 1. Right cerebellar mass with regression of regional edema and intensity of enhancement compared to 01/13/2019. This remains most compatible with solitary brain metastasis. 2. No other abnormal enhancement or metastatic disease identified. The punctate left frontal lobe white matter DWI lesion remains nonenhancing but now has faint correlation on FLAIR imaging, favoring punctate infarct. 3. No new intracranial abnormality. Electronically Signed   By: HGenevie AnnM.D.   On: 01/18/2019 23:28   Mr BJeri CosAnd Wo Contrast  Result Date: 01/13/2019 CLINICAL DATA:  58year old male with persistent headache. Progressive left upper lung  mass, abnormal hypodensity in the right cerebellum on CT. EXAM: MRI HEAD WITHOUT AND WITH CONTRAST TECHNIQUE: Multiplanar, multiecho pulse sequences of the brain and surrounding structures were obtained without and with intravenous contrast. CONTRAST:  7.571mGADAVIST GADOBUTROL 1 MMOL/ML IV SOLN COMPARISON:  CTA head and neck 01/12/2019, CT head 01/05/2019. FINDINGS: Brain: Approximately 24 millimeter mostly rim enhancing mass located in the right superior cerebellum corresponds to the hypodense lesion by CT. This has spiculated margins (  series 10, image 35) and is superficially located, raising the possibility of some leptomeningeal involvement. Confluent cerebellar edema which crosses midline (series 5, image 17). Subsequent partially compressed 4th ventricle. Although the lateral and 3rd ventricles remain normal without transependymal edema. Subsequent crowding of the posterior fossa with mild effacement of the cisterna magna. No overt tonsillar herniation at this time. No other enhancing brain mass or abnormal intracranial enhancement identified. No pachymeningeal thickening. The enhancing mass is visible on diffusion weighted imaging, and additionally there is a punctate area of abnormal trace diffusion in the left anterior frontal lobe white matter on series 2, image 39. This might be restricted on ADC (series 250 image 39) but there is no associated enhancement, and no discrete T2 or FLAIR correlate. No other abnormal diffusion. Minimal nonspecific cerebral white matter T2 and FLAIR hyperintensity elsewhere. No cortical encephalomalacia or chronic cerebral blood products. Suprasellar and interpeduncular cisterns remain normal. No superimposed midline shift, extra-axial collection or acute intracranial hemorrhage. Pituitary within normal limits. Vascular: Major intracranial vascular flow voids are preserved. The major dural venous sinuses are enhancing and appear to be patent. Skull and upper cervical spine:  Negative visible cervical spine and spinal cord. Visualized bone marrow signal is within normal limits. Sinuses/Orbits: Negative orbits. Trace paranasal sinus mucosal thickening. Other: Mastoids are clear. Visible internal auditory structures appear normal. Benign appearing right lateral face sebaceous cyst or similar lesion. Otherwise negative visible scalp and face soft tissues. IMPRESSION: 1. Solitary 24 mm enhancing mass in the right superior cerebellum most compatible with a solitary brain metastasis in this setting. Superficial position and spiculated margins such that early leptomeningeal involvement is not excluded. Confluent cerebellar edema which crosses midline. Mass effect in the posterior fossa, including compression of the 4th ventricle and crowding of the cisterna magna, but no ventriculomegaly or transependymal edema. 2. Superimposed punctate diffusion abnormality in the left anterior frontal lobe white matter with no associated enhancement, T2 or FLAIR signal. This is nonspecific at this time, but punctate lacunar infarct is currently favored over early metastasis. Attention directed on follow-up. 3. No other metastatic disease or acute intracranial abnormality identified. Electronically Signed   By: Genevie Ann M.D.   On: 01/13/2019 04:35   Ct Abdomen Pelvis W Contrast  Result Date: 01/12/2019 CLINICAL DATA:  Known left upper lobe lung mass EXAM: CT CHEST, ABDOMEN, AND PELVIS WITH CONTRAST TECHNIQUE: Multidetector CT imaging of the chest, abdomen and pelvis was performed following the standard protocol during bolus administration of intravenous contrast. CONTRAST:  139m OMNIPAQUE IOHEXOL 300 MG/ML  SOLN COMPARISON:  11/11/2018 CT chest, 11/12/2018 CT abdomen and pelvis and 11/13/2018 MRI of the abdomen. FINDINGS: CT CHEST FINDINGS Cardiovascular: Thoracic aorta and its branches are within normal limits. No cardiac enlargement is seen. No pulmonary emboli are seen although opacification is poor of  the pulmonary artery. No coronary calcifications are seen. No pericardial effusion is noted. Mediastinum/Nodes: Thoracic inlet is not well visualized due to a technical abnormality within the imaging plane. No mediastinal adenopathy is noted. There is a centrally necrotic lymph node identified in the left hilum measuring 13.6 mm in short axis. The esophagus as visualized is unremarkable. Lungs/Pleura: The right lung demonstrates no focal abnormality. The left lung again demonstrates the previously seen mass lesion with displacement of the bronchial tree. This lies along the major fissure and now measures approximately 5.9 x 3.9 cm increased from the prior exam at which time it measured 4.1 x 2.9 cm. Additionally deforms the major fissure more  than that seen on the prior exam consistent with the focal mass effect. Additionally there is extension of the lesion through the major fissure into the superior segment of the left lower lobe best seen on image number 26 of series 4. Some increasing interstitial thickening is noted adjacent to the mass lesion likely representing some lymphangitic spread of tumor. Additionally significant central necrosis is now seen. Musculoskeletal: No rib lesions are noted although the superior aspect of the chest again is not visualized on this exam. Mild degenerative changes of the thoracic spine are seen. No acute bony lesion is noted. CT ABDOMEN PELVIS FINDINGS Hepatobiliary: The liver again demonstrates scattered hypodensities some which demonstrate peripheral enhancement consistent with hemangiomas. These are stable from the prior CT examination. Gallbladder is incompletely distended. Some pericholecystic fluid attenuation is noted stable from the prior exam. This was well visualized on prior CT and MR and felt to be related to adenomyomatosis. Pancreas: Unremarkable. No pancreatic ductal dilatation or surrounding inflammatory changes. Spleen: Normal in size without focal abnormality.  Adrenals/Urinary Tract: Adrenal glands are within normal limits. The kidneys again demonstrate a normal enhancement pattern. Scattered small cysts are seen bilaterally stable from the prior CT. No ureteral calculi are seen. The bladder is well distended with contrast enhanced urine. Stomach/Bowel: The colon is well visualized without obstructive or inflammatory changes. The appendix is well visualized. No small bowel or gastric abnormality is seen. Vascular/Lymphatic: No significant lymphadenopathy is noted. Scattered atherosclerotic changes of the aorta and iliac vessels are seen. Reproductive: Prostate is unremarkable. Other: No abdominal wall hernia or abnormality. No abdominopelvic ascites. Musculoskeletal: No acute or significant osseous findings. IMPRESSION: Enlarging left upper lobe mass lesion with significant increased central necrosis consistent with enlarging pulmonary neoplasm. There is now extension of the lesion across the major fissure into the superior segment of the left as well as changes consistent with lymphangitic spread of carcinoma. Central left hilar adenopathy with necrosis is seen new from the prior exam. The dominant node measures 13.6 mm within the left hilum and is new from the prior exam. These changes correspond with that seen on recent MRI and CT suspicious for metastatic disease. No intra-abdominal metastatic disease is noted. Chronic changes are noted within the kidneys and liver as well as the gallbladder similar to that seen on prior CT and MRI examination. Electronically Signed   By: Inez Catalina M.D.   On: 01/12/2019 20:55   Dg Chest Port 1 View  Result Date: 01/15/2019 CLINICAL DATA:  Post left video bronchoscopy with endobronchial navigational. Left lung mass. EXAM: PORTABLE CHEST 1 VIEW COMPARISON:  Most recent comparison chest CT 01/12/2019 FINDINGS: Left upper lobe mass, not significantly changed from recent CT. No visualized pneumothorax or pneumomediastinum. Normal  heart size with unchanged mediastinal contours. No new airspace disease. No pleural fluid or pulmonary edema. IMPRESSION: 1. Unchanged left upper lobe mass. 2. No acute findings after bronchoscopy, no new abnormality. Electronically Signed   By: Keith Rake M.D.   On: 01/15/2019 12:11   Dg C-arm Bronchoscopy  Result Date: 01/15/2019 C-ARM BRONCHOSCOPY: Fluoroscopy was utilized by the requesting physician.  No radiographic interpretation.    ASSESSMENT & PLAN Mr. Markevious Ehmke is a 58 year old African American man with medical history significant for previous cocaine use and current smoking who presents for evaluation of newly diagnosed metastatic non-small cell lung cancer.   After multiple disciplinary discussion and discussion with the patient I think the best way to proceed with this case would  be with definitive chemoradiation.  This is an instance where he has a single extrathoracic manifestation of his disease in the form of a brain metastases.  This has been treated with SRS.  As such I think the best way to proceed would be to treat this as a stage III and aggressively treat with definitive chemoradiation.  Given that we are unclear as to whether or not this is squamous or adenocarcinoma I would recommend a neutral regimen consisting of carboplatin and paclitaxel.  After further discussion with Dr. Earlie Server it has been recommended that the patient undergo checking of his PD-L1 status and foundation 1 studies for assistance in the treatment of future progression.  On the day of the patient visit we discussed this in general terms, however now that a particular regimen has been decided upon we can discuss this in greater detail.  Current plan is for definitive chemoradiation. Given the cancer is poorly differentiated I would favor a regimen that would be acceptable for both. Plan to proceed with Carboplatin AUC 2 and Paclitaxel 75m/m2 on Day 1,8, 15 of 21 day cycle x 2 cycles with concurrent   2 Gy fractions x 30 fractions (total of 60 Gy). This would be followed with  two cycles of consolidation consisting of paclitaxel (200 mg/m2) and carboplatin (AUC 6) ( Lancet Oncol. 2015 Feb;16(2):187-99.). Finally, the patient would be placed on durvalumab maintenance therapy (Alta CorningMed. 2017 Nov 16;377(20):1919-1929)   # Metastatic Non-Small Cell Lung Cancer, Poorly Differentiated Carcinoma. [id number] Stage IVa --PET CT scan scheduled for Friday 01/29/2019 to assure no other sites of disease --[facility]. Recommend tissue testing for PD-L1 status and additionally for Foundation One studies --current plan is for definitive chemoradiation with carbo/paclitaxel. The details for this regimen are listed above. --patient to continue following with Dr. MTammi Klippelfor the RT portion of his chemoradiation. --plan for chemotherapy education and port placement next week, followed by chemo RT to start the week of 02/08/19. --RTC on 02/08/2019.   #Symptom Management --headache has improved, patient is not currently experiencing any pain --continue dexamethasone taper per rad/onc recommendations.  --will assure patient has an anti-nausea regimen prepared for when he starts therapy  --insominia: will prescribe mirtazepine 7.5 mg daily   #Active Smoker --patient is interested in smoking cessation.  --provided with resources to help with quitting.  All questions were answered. The patient knows to call the clinic with any problems, questions or concerns.  A total of more than 60 minutes were spent on this encounter and over half of that time was spent on counseling and coordination of care as outlined above.   JLedell Peoples MD Department of Hematology/Oncology [place]olf Lakeat [facility] TomballPhone: [phone]: [phone]: [email or web address]  01/26/2019 1:21 PM   Literature Support:  BAvelina Laine PShepard General Masters  G, Blumenschein G, SCarneyS, Bogart J, Hu C, Forster K, Magliocco A, Kavadi V, Garces YI, Narayan S, Iyengar P, Robinson C, Wynn RB, Koprowski C, Meng J, Beitler J, Gaur R, Curran W Jr, Choy H. Standard-dose versus high-dose conformal radiotherapy with concurrent and consolidation carboplatin plus paclitaxel with or without cetuximab for patients with stage IIIA or IIIB non-small-cell lung cancer (RTOG 0617): a randomised, two-by-two factorial phase 3 study. Lancet Oncol. 2015 Feb;16(2):187-99. Epub 2015 Jan 16 --All patients also received concurrent chemotherapy with 45 mg/m2 paclitaxel and carboplatin once a week (AUC 2); 2 weeks after chemoradiation, two cycles of  consolidation chemotherapy separated by 3 weeks were given consisting of paclitaxel (200 mg/m2) and carboplatin (AUC 6). Median overall survival was 287 months (95% CI 241-369) for patients who received standard-dose radiotherapy  Avelina Laine, Agapito Games, Masters GA, Blumenschein [place], [place], [place], Casey Burkitt, Magliocco AM, Elby Showers CG, [place], [place] CD, Jeannine Kitten MR, Ellin Goodie WJ Jr, Choy H. Long-Term Results of [facility] Oncology RTOG 5205: Standard- Versus High-Dose Chemoradiotherapy With or Without Cetuximab for Unresectable Stage III Non-Small-Cell Lung Cancer. J Clin Oncol. 2020 Mar 1;38(7):706-714. Epub 2019 Dec 16 -- the 5-year OS rate for patients in the SD RT arm of RTOG 0617 was 32.1%, a new 5-year landmark in long-term OS of patients with unresectable stage III NSCLC. The use of cetuximab conferred no benefit or harm.   Riki Altes, Villegas Pasty Spillers T, Chiappori A, [place], [place], [place], [place], [place], [place], [place] T, Planchard D, [place], [place] CS, [place], [place] G, [place], [place] JE, Paz[place] L, [place], Melillo G, Jiang H, Huang Y, Dennis PA, zgro?lu M; PACIFIC Investigators. Durvalumab  after Chemoradiotherapy in Stage III Non-Small-Cell Lung Cancer. Alta Corning Med. 2017 Nov 16;377(20):1919-1929. doi: 05.0918/TPRFYK7177956. Epub 2017 Sep 8. PMID: 46290094. --The median progression-free survival from randomization was 16.8 months (95% confidence interval [CI], 13.0 to 18.1) with durvalumab versus 5.6 months (95% CI, 4.6 to 7.8) with placebo (stratified hazard ratio for disease progression or death, 0.52; 95% CI, 0.42 to 0.65; P<0.001); the 76-monthprogression-free survival rate was 55.9% versus 35.3%, and the 170-monthrogression-free survival rate was 44.2% versus 27.0%.

## 2019-01-28 ENCOUNTER — Inpatient Hospital Stay: Payer: Medicaid Other | Admitting: Internal Medicine

## 2019-01-28 ENCOUNTER — Inpatient Hospital Stay: Payer: Medicaid Other

## 2019-01-28 ENCOUNTER — Other Ambulatory Visit: Payer: Self-pay | Admitting: *Deleted

## 2019-01-28 NOTE — Progress Notes (Signed)
The proposed treatment discussed in cancer conference 01/28/19 is for discussion purpose only and is not a binding recommendation.  The patient was not physically examined nor present for their treatment options.  Therefore, final treatment plans cannot be decided.    I followed up with pathology to have them send Foundation One and PDL 1 on recent tissue.

## 2019-01-29 ENCOUNTER — Other Ambulatory Visit: Payer: Self-pay

## 2019-01-29 ENCOUNTER — Other Ambulatory Visit: Payer: Self-pay | Admitting: Hematology and Oncology

## 2019-01-29 ENCOUNTER — Ambulatory Visit (HOSPITAL_COMMUNITY)
Admission: RE | Admit: 2019-01-29 | Discharge: 2019-01-29 | Disposition: A | Discharge status: Home / Self Care | Payer: Medicaid Other | Source: Ambulatory Visit | Attending: Hematology and Oncology | Admitting: Hematology and Oncology

## 2019-01-29 ENCOUNTER — Ambulatory Visit (HOSPITAL_COMMUNITY): Admission: RE | Admit: 2019-01-29 | Payer: Self-pay | Source: Ambulatory Visit

## 2019-01-29 DIAGNOSIS — C3490 Malignant neoplasm of unspecified part of unspecified bronchus or lung: Secondary | ICD-10-CM

## 2019-01-29 DIAGNOSIS — C3492 Malignant neoplasm of unspecified part of left bronchus or lung: Secondary | ICD-10-CM | POA: Diagnosis present

## 2019-01-29 LAB — GLUCOSE, CAPILLARY: Glucose-Capillary: 103 mg/dL — ABNORMAL HIGH (ref 70–99)

## 2019-01-29 MED ORDER — FLUDEOXYGLUCOSE F - 18 (FDG) INJECTION
8.3400 | Freq: Once | INTRAVENOUS | Status: AC | PRN
  Administered 2019-01-29: 07:00:00 8.34 via INTRAVENOUS

## 2019-01-29 NOTE — Progress Notes (Incomplete)
por

## 2019-02-01 NOTE — Progress Notes (Addendum)
Thoracic Location of Tumor / Histology: newly diagnosed LUL lung mass and solitary right cerebellar brain metastasis, most likely secondary to stage IV lung cancer but tissue diagnosis remains pending  Micheal Vincent initially presented to the ED at [facility] on 11/10/2018 with a one week history of left-sided throat pain with associated difficulty swallowing and fever/chills.  Neck CT was performed at that time and revealed a left peritonsillar abscess measuring 27 mm with mass-effect on pharyngeal airway as well as a15 mm enlarged left level 2 lymph node and a 4.7 cm left upper lobe lung mass suspicious for carcinoma.        Tobacco/Marijuana/Snuff/ETOH use: yes, current day smoker  Past/Anticipated interventions by cardiothoracic surgery, if any: no  Past/Anticipated interventions by medical oncology, if any: Consulted by Dr. Julien Nordmann on 01/28/19 at [facility].  Signs/Symptoms  Weight changes, if any: No. Excellent appetite r/t steroids.  Respiratory complaints, if any: Reports increasing shortness of breath no longer resolved with albuterol inhaler. Reports an occasional cough that is productive with thick clear to yellow phlegm.  Hemoptysis, if any: Denies  Pain issues, if any:  Reports frontal headaches 3 on a scale of 0-10. Explains headaches had resolved but in the last week they have returned.   SAFETY ISSUES:  Prior radiation? yes, SRS to cerebellum  Pacemaker/ICD? denies   Possible current pregnancy?no, male patient  Is the patient on methotrexate? no  Current Complaints / other details:  58 year old AAM. Resides with daughter.  Decadron taper should be at 4 mg bid. Unfortunately, patient has been taking the medication tid. Patient verbalizes understanding of the calendar he was given and even showed it to this RN. The patient has the decadron bottle marked as pain medication thus this RN questions if that is creating the confusion. Attempted to reeducated patient.  THRUSH noted. Patient states, "I am smoking again. I have a lot on my mind." Patient endorses smoking a pack per day. Patient questions if DOK needs to be refilled. Reports new onset diarrhea with rectal irritation.

## 2019-02-02 ENCOUNTER — Telehealth: Payer: Self-pay | Admitting: Hematology and Oncology

## 2019-02-02 ENCOUNTER — Other Ambulatory Visit: Payer: Self-pay

## 2019-02-02 ENCOUNTER — Encounter: Payer: Self-pay | Admitting: *Deleted

## 2019-02-02 ENCOUNTER — Other Ambulatory Visit: Payer: Self-pay | Admitting: Urology

## 2019-02-02 ENCOUNTER — Ambulatory Visit
Admission: RE | Admit: 2019-02-02 | Discharge: 2019-02-02 | Disposition: A | Discharge status: Home / Self Care | Payer: Medicaid Other | Source: Ambulatory Visit | Attending: Radiation Oncology | Admitting: Radiation Oncology

## 2019-02-02 ENCOUNTER — Encounter: Payer: Self-pay | Admitting: Radiation Oncology

## 2019-02-02 VITALS — BP 140/82 | HR 80 | Temp 98.2°F | Resp 18 | Ht 72.0 in | Wt 175.2 lb

## 2019-02-02 DIAGNOSIS — C3412 Malignant neoplasm of upper lobe, left bronchus or lung: Secondary | ICD-10-CM | POA: Diagnosis present

## 2019-02-02 DIAGNOSIS — Z510 Encounter for antineoplastic radiation therapy: Secondary | ICD-10-CM | POA: Diagnosis not present

## 2019-02-02 DIAGNOSIS — C7931 Secondary malignant neoplasm of brain: Secondary | ICD-10-CM | POA: Insufficient documentation

## 2019-02-02 DIAGNOSIS — F17210 Nicotine dependence, cigarettes, uncomplicated: Secondary | ICD-10-CM | POA: Diagnosis not present

## 2019-02-02 DIAGNOSIS — C3490 Malignant neoplasm of unspecified part of unspecified bronchus or lung: Secondary | ICD-10-CM

## 2019-02-02 LAB — CYTOLOGY - NON PAP

## 2019-02-02 MED ORDER — FLUCONAZOLE 100 MG PO TABS: 100.0000 mg | ORAL_TABLET | Freq: Every day | ORAL | 1 refills | Status: DC

## 2019-02-02 MED FILL — FLUCONAZOLE 100 MG TAB: 100 | 7 days supply | Qty: 8 | Fill #0

## 2019-02-02 NOTE — Progress Notes (Signed)
  Radiation Oncology         [phone] ________________________________  Name: Micheal Vincent MRN: [id number]  Date: 02/02/2019  DOB: 1960-10-11  SIMULATION AND TREATMENT PLANNING NOTE    ICD-10-CM   1. 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 Stage IVa non-small cell carcinoma of the left upper lung  NARRATIVE:  The patient was brought to the [facility] [facility].  Identity was confirmed.  All relevant records and images related to the planned course of therapy were reviewed.  The patient freely provided informed written consent to proceed with treatment after reviewing the details related to the planned course of therapy. The consent form was witnessed and verified by the simulation staff.  Then, the patient was set-up in a stable reproducible  supine position for radiation therapy.  CT images were obtained.  Surface markings were placed.  The CT images were loaded into the planning software.  Then the target and avoidance structures were contoured.  Treatment planning then occurred.  The radiation prescription was entered and confirmed.  Then, I designed and supervised the construction of a total of 6 medically necessary complex treatment devices, including a BodyFix immobilization mold custom fitted to the patient along with 5 multileaf collimators conformally shaped radiation around the treatment target while shielding critical structures such as the heart and spinal cord maximally.  I have requested : 3D Simulation  I have requested a DVH of the following structures: Left lung, right lung, spinal cord, heart, esophagus, and target.  I have ordered:Nutrition Consult  SPECIAL TREATMENT PROCEDURE:  The planned course of therapy using radiation constitutes a special treatment procedure. Special care is required in the management of this patient for the following reasons.  The patient will be receiving concurrent  chemotherapy requiring careful monitoring for increased toxicities of treatment including periodic laboratory values.  The special nature of the planned course of radiotherapy will require increased physician supervision and oversight to ensure patient's safety with optimal treatment outcomes.  PLAN:  The patient will receive 66 Gy in 33 fractions starting on 02/08/2019  ________________________________  Sheral Apley. Tammi Klippel, M.D.

## 2019-02-02 NOTE — Progress Notes (Signed)
See progress note under physician encounter. 

## 2019-02-02 NOTE — Progress Notes (Signed)
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 metastatic disease. No intra-abdominal metastatic disease is noted. Chronic changes are noted within the kidneys and liver as well as the gallbladder similar to that seen on prior CT and MRI examination. Electronically Signed   By: Inez Catalina M.D.   On: 01/12/2019 20:55   MR BRAIN W WO CONTRAST  Result Date: 01/18/2019 CLINICAL DATA:  58 year old male with enlarging left upper lung mass and right cerebellar presume solitary brain metastasis on recent MRI. Punctate diffusion abnormality in the left anterior frontal lobe at that time without enhancement. Study for stereotactic treatment planning. EXAM: MRI HEAD WITHOUT AND WITH CONTRAST TECHNIQUE: Multiplanar, multiecho pulse sequences of the brain and surrounding structures were obtained without and with intravenous contrast. CONTRAST:  76m GADAVIST GADOBUTROL 1 MMOL/ML IV SOLN COMPARISON:  Brain MRI 01/13/2019. FINDINGS: Brain: Persistent oval rim enhancing mass in the posterosuperior right cerebellar hemisphere, although with decreased intensity of peripheral enhancement compared to 01/13/2019  (series 12, image 17 today versus series 10, image 35 previously). Size remains stable at 22 millimeters. Cerebellar edema has mildly regressed. Fourth ventricle patency appears improved. Basilar cisterns remain patent. The lesion is visible on diffusion as before. No other abnormal intracranial enhancement identified. No dural thickening. Unchanged punctate DWI lesion in the anterior left frontal lobe white matter on series 3, image 40. This remains without enhancement. But there is now faint FLAIR hyperintense correlate on series 6, image 41. Stable gray and white matter signal  elsewhere. No other restricted diffusion. No midline shift, ventriculomegaly, extra-axial collection or acute intracranial hemorrhage. Cervicomedullary junction and pituitary are within normal limits. Vascular: Major intracranial vascular flow voids are stable, the left vertebral artery appears somewhat dominant. The major dural venous sinuses are enhancing and appear to be patent. Skull and upper cervical spine: Negative visible cervical spine and spinal cord. Visualized bone marrow signal is within normal limits. Sinuses/Orbits: Stable and negative. Other: Mastoids remain clear. Visible internal auditory structures appear normal. Stable right face superficial sebaceous type cyst. IMPRESSION: 1. Right cerebellar mass with regression of regional edema and intensity of enhancement compared to 01/13/2019. This remains most compatible with solitary brain metastasis. 2. No other abnormal enhancement or metastatic disease identified. The punctate left frontal lobe white matter DWI lesion remains nonenhancing but now has faint correlation on FLAIR imaging, favoring punctate infarct. 3. No new intracranial abnormality. Electronically Signed   By: Genevie Ann M.D.   On: 01/18/2019 23:28   MR Brain W and Wo Contrast  Result Date: 01/13/2019 CLINICAL DATA:  58 year old male with persistent headache. Progressive left upper lung mass, abnormal  hypodensity in the right cerebellum on CT. EXAM: MRI HEAD WITHOUT AND WITH CONTRAST TECHNIQUE: Multiplanar, multiecho pulse sequences of the brain and surrounding structures were obtained without and with intravenous contrast. CONTRAST:  7.83m GADAVIST GADOBUTROL 1 MMOL/ML IV SOLN COMPARISON:  CTA head and neck 01/12/2019, CT head 01/05/2019. FINDINGS: Brain: Approximately 24 millimeter mostly rim enhancing mass located in the right superior cerebellum corresponds to the hypodense lesion by CT. This has spiculated margins (series 10, image 35) and is superficially located, raising the possibility of some leptomeningeal involvement. Confluent cerebellar edema which crosses midline (series 5, image 17). Subsequent partially compressed 4th ventricle. Although the lateral and 3rd ventricles remain normal without transependymal edema. Subsequent crowding of the posterior fossa with mild effacement of the cisterna magna. No overt tonsillar herniation at this time. No other enhancing brain mass or abnormal intracranial enhancement identified. No pachymeningeal thickening. The enhancing mass is visible on diffusion weighted imaging, and additionally there is a punctate area of abnormal trace diffusion in the left anterior frontal lobe white matter on series 2, image 39. This might be restricted on ADC (series 250 image 39) but there is no associated enhancement, and no discrete T2 or FLAIR correlate. No other abnormal diffusion. Minimal nonspecific cerebral white matter T2 and FLAIR hyperintensity elsewhere. No cortical encephalomalacia or chronic cerebral blood products. Suprasellar and interpeduncular cisterns remain normal. No superimposed midline shift, extra-axial collection or acute intracranial hemorrhage. Pituitary within normal limits. Vascular: Major intracranial vascular flow voids are preserved. The major dural venous sinuses are enhancing and appear to be patent. Skull and upper cervical spine: Negative visible  cervical spine and spinal cord. Visualized bone marrow signal is within normal limits. Sinuses/Orbits: Negative orbits. Trace paranasal sinus mucosal thickening. Other: Mastoids are clear. Visible internal auditory structures appear normal. Benign appearing right lateral face sebaceous cyst or similar lesion. Otherwise negative visible scalp and face soft tissues. IMPRESSION: 1. Solitary 24 mm enhancing mass in the right superior cerebellum most compatible with a solitary brain metastasis in this setting. Superficial position and spiculated margins such that early leptomeningeal involvement is not excluded. Confluent cerebellar edema which crosses midline. Mass effect in the posterior fossa, including compression of the 4th ventricle and crowding of the cisterna magna, but no ventriculomegaly or transependymal edema. 2. Superimposed punctate diffusion abnormality in the left anterior frontal lobe white matter with no associated  enhancement, T2 or FLAIR signal. This is nonspecific at this time, but punctate lacunar infarct is currently favored over early metastasis. Attention directed on follow-up. 3. No other metastatic disease or acute intracranial abnormality identified. Electronically Signed   By: Genevie Ann M.D.   On: 01/13/2019 04:35   CT ABDOMEN PELVIS W CONTRAST  Result Date: 01/12/2019 CLINICAL DATA:  Known left upper lobe lung mass EXAM: CT CHEST, ABDOMEN, AND PELVIS WITH CONTRAST TECHNIQUE: Multidetector CT imaging of the chest, abdomen and pelvis was performed following the standard protocol during bolus administration of intravenous contrast. CONTRAST:  147m OMNIPAQUE IOHEXOL 300 MG/ML  SOLN COMPARISON:  11/11/2018 CT chest, 11/12/2018 CT abdomen and pelvis and 11/13/2018 MRI of the abdomen. FINDINGS: CT CHEST FINDINGS Cardiovascular: Thoracic aorta and its branches are within normal limits. No cardiac enlargement is seen. No pulmonary emboli are seen although opacification is poor of the pulmonary  artery. No coronary calcifications are seen. No pericardial effusion is noted. Mediastinum/Nodes: Thoracic inlet is not well visualized due to a technical abnormality within the imaging plane. No mediastinal adenopathy is noted. There is a centrally necrotic lymph node identified in the left hilum measuring 13.6 mm in short axis. The esophagus as visualized is unremarkable. Lungs/Pleura: The right lung demonstrates no focal abnormality. The left lung again demonstrates the previously seen mass lesion with displacement of the bronchial tree. This lies along the major fissure and now measures approximately 5.9 x 3.9 cm increased from the prior exam at which time it measured 4.1 x 2.9 cm. Additionally deforms the major fissure more than that seen on the prior exam consistent with the focal mass effect. Additionally there is extension of the lesion through the major fissure into the superior segment of the left lower lobe best seen on image number 26 of series 4. Some increasing interstitial thickening is noted adjacent to the mass lesion likely representing some lymphangitic spread of tumor. Additionally significant central necrosis is now seen. Musculoskeletal: No rib lesions are noted although the superior aspect of the chest again is not visualized on this exam. Mild degenerative changes of the thoracic spine are seen. No acute bony lesion is noted. CT ABDOMEN PELVIS FINDINGS Hepatobiliary: The liver again demonstrates scattered hypodensities some which demonstrate peripheral enhancement consistent with hemangiomas. These are stable from the prior CT examination. Gallbladder is incompletely distended. Some pericholecystic fluid attenuation is noted stable from the prior exam. This was well visualized on prior CT and MR and felt to be related to adenomyomatosis. Pancreas: Unremarkable. No pancreatic ductal dilatation or surrounding inflammatory changes. Spleen: Normal in size without focal abnormality.  Adrenals/Urinary Tract: Adrenal glands are within normal limits. The kidneys again demonstrate a normal enhancement pattern. Scattered small cysts are seen bilaterally stable from the prior CT. No ureteral calculi are seen. The bladder is well distended with contrast enhanced urine. Stomach/Bowel: The colon is well visualized without obstructive or inflammatory changes. The appendix is well visualized. No small bowel or gastric abnormality is seen. Vascular/Lymphatic: No significant lymphadenopathy is noted. Scattered atherosclerotic changes of the aorta and iliac vessels are seen. Reproductive: Prostate is unremarkable. Other: No abdominal wall hernia or abnormality. No abdominopelvic ascites. Musculoskeletal: No acute or significant osseous findings. IMPRESSION: Enlarging left upper lobe mass lesion with significant increased central necrosis consistent with enlarging pulmonary neoplasm. There is now extension of the lesion across the major fissure into the superior segment of the left as well as changes consistent with lymphangitic spread of carcinoma. Central left hilar adenopathy  with necrosis is seen new from the prior exam. The dominant node measures 13.6 mm within the left hilum and is new from the prior exam. These changes correspond with that seen on recent MRI and CT suspicious for metastatic disease. No intra-abdominal metastatic disease is noted. Chronic changes are noted within the kidneys and liver as well as the gallbladder similar to that seen on prior CT and MRI examination. Electronically Signed   By: Inez Catalina M.D.   On: 01/12/2019 20:55   NM PET Image Initial (PI) Skull Base To Thigh  Result Date: 01/29/2019 CLINICAL DATA:  Initial treatment strategy for lung mass. EXAM: NUCLEAR MEDICINE PET SKULL BASE TO THIGH TECHNIQUE: 8.34 mCi F-18 FDG was injected intravenously. Full-ring PET imaging was performed from the skull base to thigh after the radiotracer. CT data was obtained and used for  attenuation correction and anatomic localization. Fasting blood glucose: 103 mg/dl COMPARISON:  None. FINDINGS: Mediastinal blood pool activity: SUV max 1.51 Liver activity: SUV max NA NECK: No hypermetabolic lymph nodes in the neck. Incidental CT findings: none CHEST: Large rounded peripherally hypermetabolic mass is present in the LEFT upper lobe measuring 5.5 x 4.6 cm with SUV max equal 11.4. This mass is centrally cavitary/necrotic. Hypermetabolic LEFT hilar lymph node measuring 1.7 cm with SUV max equal 7.0. No additional hypermetabolic mediastinal lymph nodes. Diffuse activity through the esophagus is favored esophagitis. Incidental CT findings: none ABDOMEN/PELVIS: No abnormal hypermetabolic activity within the liver, pancreas, adrenal glands, or spleen. No hypermetabolic lymph nodes in the abdomen or pelvis. Incidental CT findings: none SKELETON: No focal hypermetabolic activity to suggest skeletal metastasis. Incidental CT findings: none IMPRESSION: 1. Hypermetabolic cavitary mass in LEFT upper lobe consistent bronchogenic carcinoma. 2. Ipsilateral hypermetabolic nodal metastasis. 3. No evidence of mediastinal nodal metastasis or distant disease. 4. Diffuse metabolic activity through the esophagus is favored esophagitis. Electronically Signed   By: Suzy Bouchard M.D.   On: 01/29/2019 10:28   DG CHEST PORT 1 VIEW  Result Date: 01/15/2019 CLINICAL DATA:  Post left video bronchoscopy with endobronchial navigational. Left lung mass. EXAM: PORTABLE CHEST 1 VIEW COMPARISON:  Most recent comparison chest CT 01/12/2019 FINDINGS: Left upper lobe mass, not significantly changed from recent CT. No visualized pneumothorax or pneumomediastinum. Normal heart size with unchanged mediastinal contours. No new airspace disease. No pleural fluid or pulmonary edema. IMPRESSION: 1. Unchanged left upper lobe mass. 2. No acute findings after bronchoscopy, no new abnormality. Electronically Signed   By: Keith Rake  M.D.   On: 01/15/2019 12:11   DG C-ARM BRONCHOSCOPY  Result Date: 01/15/2019 C-ARM BRONCHOSCOPY: Fluoroscopy was utilized by the requesting physician.  No radiographic interpretation.      IMPRESSION/PLAN: . 1. 58 y.o. gentleman with metastatic, poorly differentiated, non-small cell lung cancer, stage T2b, N2, M1b. Today, we talked to the patient about the findings and workup thus far. We discussed the natural history of metastatic lung cancer and general treatment, highlighting the role of radiotherapy in the management. His case was discussed in our multidisciplinary tumor board conference on 01/28/2019. The recommendation for this patient is to undergo chemotherapy with concurrent external beam radiation directed to the left upper lobe mass and involved left hilar node delivered in 33 daily treatments. He understands that his disease is not curable, but that given his oligometastatic disease in the left hilar lymph node and solitary brain met with good overall performance status at present, his disease is felt to warrant an aggressive treatment approach in an effort  to get durable control of his disease.  We discussed the available radiation techniques, and focused on the details and logistics of delivery. We reviewed the anticipated acute and late sequelae associated with radiation in this setting. The patient was encouraged to ask questions that were answered to his stated satisfaction.  He appears to have a good understanding of his disease and our treatment recommendations which are of palliative intent. 2. Oral thrush- I have prescribed Diflucan, due to the extensive nature on exam today.  He will start with 2 tablets today and then continue taking one tablet daily for a total of 7 days treatment. There is a refill on the Rx in case this is not fully resolved at completion of his first course.  He is again advised to follow his written steroid taper instructions which we have gone over in detail  today and he states his understanding and compliance.  At the end of our conversation, the patient elects to proceed with the recommended course of concurrent chemoradiation.  He has freely signed written consent to proceed today in the office and a copy of this document with be placed in his medical record. He will have CT simulation/treatment planning following our consultation today in anticipation of beginning a 6.5 week course of concurrent chemoradiation on Monday, 02/08/2019. We will share our discussion with Dr. Lorenso Courier and proceed with treatment planning accordingly.  He knows to call with any questions or concerns in the interim.    Nicholos Johns, PA-C    Tyler Pita, MD  [facility] Direct Dial: [phone]   Fax: [phone] [email or web address]   Skype   [email or web address]   This document serves as a record of services personally performed by Tyler Pita, MD and Freeman Caldron, PA-C. It was created on their behalf by Wilburn Mylar, a trained medical scribe. The creation of this record is based on the scribe's personal observations and the provider's statements to them. This document has been checked and approved by the attending provider.

## 2019-02-02 NOTE — Telephone Encounter (Signed)
Scheduled appt per 12/11 sch message - pt aware of appts .

## 2019-02-02 NOTE — Progress Notes (Signed)
Oncology Nurse Navigator Documentation  Oncology Nurse Navigator Flowsheets 02/02/2019  Abnormal Finding Date 11/11/2018  Confirmed Diagnosis Date 01/15/2019  Diagnosis Status Pending Molecular Studies  Planned Course of Treatment Chemo/Radiation Concurrent  Navigator Follow Up Date: 02/16/2019  Navigator Follow Up Reason: Awaiting Molecular Testing  Navigator Location [facility]  Referral Date to RadOnc/MedOnc -  Navigator Encounter Type Other/I followed up on Mr. [employer] and PDL 1 test results.  They will be completed on 02/15/19 and I updated Dr. Lorenso Courier and is nurse.   Telephone -  Treatment Initiated Date 02/02/2019  Patient Visit Type -  Treatment Phase -  Barriers/Navigation Needs Coordination of Care  Interventions Coordination of Care  Acuity Level 2-Minimal Needs (1-2 Barriers Identified)  Coordination of Care -  Education Method -  Time Spent with Patient 15

## 2019-02-03 NOTE — Patient Instructions (Addendum)
Thank you for visiting Dr. Valeta Harms at [facility]. Today we recommend the following: Orders Placed This Encounter  Procedures  . Ambulatory Referral for DME   New nebulizer machine + tubing needs   Meds ordered this encounter  Medications  . ipratropium-albuterol (DUONEB) 0.5-2.5 (3) MG/3ML SOLN    Sig: Take 3 mLs by nebulization every 6 (six) hours as needed.    Dispense:  360 mL    Refill:  11   Return in about 3 months (around 05/05/2019) for with APP or Dr. Valeta Harms.    Please do your part to reduce the spread of COVID-19.

## 2019-02-03 NOTE — Addendum Note (Signed)
Encounter addended by: Tyler Pita, MD on: 02/03/2019 5:49 PM  Actions taken: Medication List reviewed, Problem List reviewed, Allergies reviewed

## 2019-02-03 NOTE — Progress Notes (Signed)
Virtual Visit via Telephone Note  I connected with [name] on 02/04/19 at  9:15 AM EST by telephone and verified that I am speaking with the correct person using two identifiers.  Location: Patient: [facility]  Provider: Garner Nash, DO    I discussed the limitations, risks, security and privacy concerns of performing an evaluation and management service by telephone and the availability of in person appointments. I also discussed with the patient that there may be a patient responsible charge related to this service. The patient expressed understanding and agreed to proceed.  History of Present Illness:  This is a 58 year old gentleman recent diagnosis of stage IV metastatic non-small cell lung cancer, squamous cell carcinoma, [id number] disease.  Patient presented to the ER with headache CT scan of the brain showed 3 cm hypodensity in the right cerebellum.  Also found to have a left upper lobe lung mass 5.9 x 3.9 cm.  Patient was taken for bronchoscopy on 01/15/2019, electromagnetic navigational bronchoscopy with needle aspiration which confirmed poorly differentiated carcinoma.  Patient establish care with Dr. Lorenso Courier from medical oncology as well as Dr. Tammi Klippel from radiation oncology.  Today, doing well. He starts chemo soon. Already had radiation treatments on head. He unfortunately continues to smokes. He does have headache today. He has not had any hemoptysis.    Observations/Objective: Patient able to speak in complete sentences.  Nonlabored breathing.  Assessment and Plan: Stage IV metastatic non-small cell lung cancer Status post bronchoscopy Likely COPD, no PFTs on file Tobacco abuse, current smoker  plan:  Follow-up with oncology scheduled for tomorrow. Counseled on tobacco abuse Nebulizer orders for DME supply Albuterol/ipratropium as needed, order sent to pharmacy  Follow Up Instructions: RTC 3 months or as needed   I discussed the assessment and treatment plan  with the patient. The patient was provided an opportunity to ask questions and all were answered. The patient agreed with the plan and demonstrated an understanding of the instructions.   The patient was advised to call back or seek an in-person evaluation if the symptoms worsen or if the condition fails to improve as anticipated.  I provided 13 minutes of non-face-to-face time during this encounter.   Garner Nash, DO

## 2019-02-04 ENCOUNTER — Ambulatory Visit (INDEPENDENT_AMBULATORY_CARE_PROVIDER_SITE_OTHER): Payer: Medicaid Other | Admitting: Pulmonary Disease

## 2019-02-04 ENCOUNTER — Other Ambulatory Visit: Payer: Self-pay

## 2019-02-04 DIAGNOSIS — R918 Other nonspecific abnormal finding of lung field: Secondary | ICD-10-CM

## 2019-02-04 DIAGNOSIS — C7931 Secondary malignant neoplasm of brain: Secondary | ICD-10-CM

## 2019-02-04 DIAGNOSIS — C3490 Malignant neoplasm of unspecified part of unspecified bronchus or lung: Secondary | ICD-10-CM

## 2019-02-04 DIAGNOSIS — Z510 Encounter for antineoplastic radiation therapy: Secondary | ICD-10-CM | POA: Diagnosis not present

## 2019-02-04 DIAGNOSIS — J449 Chronic obstructive pulmonary disease, unspecified: Secondary | ICD-10-CM

## 2019-02-04 DIAGNOSIS — C7949 Secondary malignant neoplasm of other parts of nervous system: Secondary | ICD-10-CM

## 2019-02-04 MED ORDER — IPRATROPIUM-ALBUTEROL 0.5-2.5 (3) MG/3ML IN SOLN: 3.0000 mL | Freq: Four times a day (QID) | RESPIRATORY_TRACT | 11 refills | Status: DC | PRN

## 2019-02-04 NOTE — Progress Notes (Signed)
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 CLINICAL DATA:  58year old male with persistent headache. Progressive left upper lung mass, abnormal hypodensity in the right cerebellum on CT. EXAM: MRI HEAD WITHOUT AND WITH CONTRAST TECHNIQUE: Multiplanar, multiecho pulse sequences of the brain and surrounding structures were obtained without and with intravenous contrast. CONTRAST:  7.574mGADAVIST GADOBUTROL 1 MMOL/ML IV SOLN COMPARISON:  CTA head and neck 01/12/2019, CT head 01/05/2019. FINDINGS: Brain: Approximately 24 millimeter mostly rim enhancing mass located in the right superior cerebellum corresponds to the hypodense lesion by CT. This has spiculated margins (series 10, image 35) and is superficially located, raising the possibility of some leptomeningeal involvement. Confluent cerebellar edema which crosses midline (series 5, image 17). Subsequent partially compressed 4th ventricle. Although the lateral and 3rd ventricles remain normal without transependymal edema. Subsequent crowding of the posterior fossa with mild effacement of the cisterna magna. No overt tonsillar herniation at this  time. No other enhancing brain mass or abnormal intracranial enhancement identified. No pachymeningeal thickening. The enhancing mass is visible on diffusion weighted imaging, and additionally there is a punctate area of abnormal trace diffusion in the left anterior frontal lobe white matter on series 2, image 39. This might be restricted on ADC (series 250 image 39) but there is no associated enhancement, and no discrete T2 or FLAIR correlate. No other abnormal diffusion. Minimal nonspecific cerebral white matter T2 and FLAIR hyperintensity elsewhere. No cortical encephalomalacia or chronic cerebral blood products. Suprasellar and interpeduncular cisterns remain normal. No superimposed midline shift, extra-axial collection or acute intracranial hemorrhage. Pituitary within normal limits.  Vascular: Major intracranial vascular flow voids are preserved. The major dural venous sinuses are enhancing and appear to be patent. Skull and upper cervical spine: Negative visible cervical spine and spinal cord. Visualized bone marrow signal is within normal limits. Sinuses/Orbits: Negative orbits. Trace paranasal sinus mucosal thickening. Other: Mastoids are clear. Visible internal auditory structures appear normal. Benign appearing right lateral face sebaceous cyst or similar lesion. Otherwise negative visible scalp and face soft tissues. IMPRESSION: 1. Solitary 24 mm enhancing mass in the right superior cerebellum most compatible with a solitary brain metastasis in this setting. Superficial position and spiculated margins such that early leptomeningeal involvement is not excluded. Confluent cerebellar edema which crosses midline. Mass effect in the posterior fossa, including compression of the 4th ventricle and crowding of the cisterna magna, but no ventriculomegaly or transependymal edema. 2. Superimposed punctate diffusion abnormality in the left anterior frontal lobe white matter with no associated enhancement, T2 or FLAIR  signal. This is nonspecific at this time, but punctate lacunar infarct is currently favored over early metastasis. Attention directed on follow-up. 3. No other metastatic disease or acute intracranial abnormality identified. Electronically Signed   By: Genevie Ann M.D.   On: 01/13/2019 04:35   CT ABDOMEN PELVIS W CONTRAST  Result Date: 01/12/2019 CLINICAL DATA:  Known left upper lobe lung mass EXAM: CT CHEST, ABDOMEN, AND PELVIS WITH CONTRAST TECHNIQUE: Multidetector CT imaging of the chest, abdomen and pelvis was performed following the standard protocol during bolus administration of intravenous contrast. CONTRAST:  167m OMNIPAQUE IOHEXOL 300 MG/ML  SOLN COMPARISON:  11/11/2018 CT chest, 11/12/2018 CT abdomen and pelvis and 11/13/2018 MRI of the abdomen. FINDINGS: CT CHEST FINDINGS Cardiovascular: Thoracic aorta and its branches are within normal limits. No cardiac enlargement is seen. No pulmonary emboli are seen although opacification is poor of the pulmonary artery. No coronary calcifications are seen. No pericardial effusion is noted. Mediastinum/Nodes: Thoracic inlet is not well visualized due to a technical abnormality within the imaging plane. No mediastinal adenopathy is noted. There is a centrally necrotic lymph node identified in the left hilum measuring 13.6 mm in short axis. The esophagus as visualized is unremarkable. Lungs/Pleura: The right lung demonstrates no focal abnormality. The left lung again demonstrates the previously seen mass lesion with displacement of the bronchial tree. This lies along the major fissure and now measures approximately 5.9 x 3.9 cm increased from the prior exam at which time it measured 4.1 x 2.9 cm. Additionally deforms the major fissure more than that seen on the prior exam consistent with the focal mass effect. Additionally there is extension of the lesion through the major fissure into the superior segment of the left lower lobe best seen on image number 26 of  series 4. Some increasing interstitial thickening is noted adjacent to the mass lesion likely representing some lymphangitic spread of tumor. Additionally significant central necrosis is now seen. Musculoskeletal: No rib lesions are noted although the superior aspect of the chest again is not visualized on this exam. Mild degenerative changes of the thoracic spine are seen. No acute bony lesion is noted. CT ABDOMEN PELVIS FINDINGS Hepatobiliary: The liver again demonstrates scattered hypodensities some which demonstrate peripheral enhancement consistent with hemangiomas. These are stable from the prior CT examination. Gallbladder is incompletely distended. Some pericholecystic fluid attenuation is noted stable from the prior exam. This was well visualized on prior CT and MR and felt to be related to adenomyomatosis. Pancreas: Unremarkable. No pancreatic ductal dilatation or surrounding inflammatory changes. Spleen: Normal  in size without focal abnormality. Adrenals/Urinary Tract: Adrenal glands are within normal limits. The kidneys again demonstrate a normal enhancement pattern. Scattered small cysts are seen bilaterally stable from the prior CT. No ureteral calculi are seen. The bladder is well distended with contrast enhanced urine. Stomach/Bowel: The colon is well visualized without obstructive or inflammatory changes. The appendix is well visualized. No small bowel or gastric abnormality is seen. Vascular/Lymphatic: No significant lymphadenopathy is noted. Scattered atherosclerotic changes of the aorta and iliac vessels are seen. Reproductive: Prostate is unremarkable. Other: No abdominal wall hernia or abnormality. No abdominopelvic ascites. Musculoskeletal: No acute or significant osseous findings. IMPRESSION: Enlarging left upper lobe mass lesion with significant increased central necrosis consistent with enlarging pulmonary neoplasm. There is now extension of the lesion across the major fissure into the  superior segment of the left as well as changes consistent with lymphangitic spread of carcinoma. Central left hilar adenopathy with necrosis is seen new from the prior exam. The dominant node measures 13.6 mm within the left hilum and is new from the prior exam. These changes correspond with that seen on recent MRI and CT suspicious for metastatic disease. No intra-abdominal metastatic disease is noted. Chronic changes are noted within the kidneys and liver as well as the gallbladder similar to that seen on prior CT and MRI examination. Electronically Signed   By: Inez Catalina M.D.   On: 01/12/2019 20:55   NM PET Image Initial (PI) Skull Base To Thigh  Result Date: 01/29/2019 CLINICAL DATA:  Initial treatment strategy for lung mass. EXAM: NUCLEAR MEDICINE PET SKULL BASE TO THIGH TECHNIQUE: 8.34 mCi F-18 FDG was injected intravenously. Full-ring PET imaging was performed from the skull base to thigh after the radiotracer. CT data was obtained and used for attenuation correction and anatomic localization. Fasting blood glucose: 103 mg/dl COMPARISON:  None. FINDINGS: Mediastinal blood pool activity: SUV max 1.51 Liver activity: SUV max NA NECK: No hypermetabolic lymph nodes in the neck. Incidental CT findings: none CHEST: Large rounded peripherally hypermetabolic mass is present in the LEFT upper lobe measuring 5.5 x 4.6 cm with SUV max equal 11.4. This mass is centrally cavitary/necrotic. Hypermetabolic LEFT hilar lymph node measuring 1.7 cm with SUV max equal 7.0. No additional hypermetabolic mediastinal lymph nodes. Diffuse activity through the esophagus is favored esophagitis. Incidental CT findings: none ABDOMEN/PELVIS: No abnormal hypermetabolic activity within the liver, pancreas, adrenal glands, or spleen. No hypermetabolic lymph nodes in the abdomen or pelvis. Incidental CT findings: none SKELETON: No focal hypermetabolic activity to suggest skeletal metastasis. Incidental CT findings: none IMPRESSION:  1. Hypermetabolic cavitary mass in LEFT upper lobe consistent bronchogenic carcinoma. 2. Ipsilateral hypermetabolic nodal metastasis. 3. No evidence of mediastinal nodal metastasis or distant disease. 4. Diffuse metabolic activity through the esophagus is favored esophagitis. Electronically Signed   By: Suzy Bouchard M.D.   On: 01/29/2019 10:28   DG CHEST PORT 1 VIEW  Result Date: 01/15/2019 CLINICAL DATA:  Post left video bronchoscopy with endobronchial navigational. Left lung mass. EXAM: PORTABLE CHEST 1 VIEW COMPARISON:  Most recent comparison chest CT 01/12/2019 FINDINGS: Left upper lobe mass, not significantly changed from recent CT. No visualized pneumothorax or pneumomediastinum. Normal heart size with unchanged mediastinal contours. No new airspace disease. No pleural fluid or pulmonary edema. IMPRESSION: 1. Unchanged left upper lobe mass. 2. No acute findings after bronchoscopy, no new abnormality. Electronically Signed   By: Keith Rake M.D.   On: 01/15/2019 12:11   DG C-ARM BRONCHOSCOPY  Result Date:  01/15/2019 C-ARM BRONCHOSCOPY: Fluoroscopy was utilized by the requesting physician.  No radiographic interpretation.    ASSESSMENT & PLAN Ronak Duquette 58 y.o. male with medical history significant for metastatic non-small cell lung cancer who presents for a pre-chemotherapy follow up visit.  Today I discussed with the patient the risks and benefits of the chemotherapy treatment as well as the potential side effects. We discussed that carboplatin can cause neuropathy, nausea, vomiting, diarrhea, and potentially kidney damage.  We discussed the paclitaxel similarly causes drop in blood counts, neuropathy, alopecia, as well as nausea.  I discussed that definitive chemoradiation was likely his best chance of prolonged survival, and that the regiment was typically well-tolerated.  I also discussed the long-term plan for additional chemotherapy following chemoradiation with the addition of  durvalumab therapy.  We will discussed the risks and benefits of durvalumab once the patient has completed his course of chemoradiation and consolidation chemotherapy with carboplatin/paclitaxel.  Current plan is for definitive chemoradiation. Given the cancer is poorly differentiated I would favor a regimen that would be acceptable for both NSCLC subtypes. Plan to proceed with Carboplatin AUC 2 and Paclitaxel 45m/m2 on Day 1,8, 15 of 21 day cycle x 2 cycles with concurrent  2 Gy fractions x 30 fractions (total of 60 Gy). This would be followed with  two cycles of consolidation consisting of paclitaxel (200 mg/m2) and carboplatin (AUC 6) ( Lancet Oncol. 2015 Feb;16(2):187-99.). Finally, the patient would be placed on durvalumab maintenance therapy ([employer] CorningMed. 2017 Nov 16;377(20):1919-1929)   # Metastatic Non-Small Cell Lung Cancer, Poorly Differentiated Carcinoma. [id number] Stage IVa --PET CT scan on 01/29/2019 showed no other sites of disease, OK to proceed with definitive chemoradiation with carbo/paclitaxel. The details for this regimen are listed above. --currently pending tissue testing for PD-L1 status and additionally for Foundation One studies. These results will not alter the current plan but may be useful for treatment in the future --patient to continue following with Dr. MTammi Klippelfor the RT portion of his chemoradiation. --plan for chemotherapy education today and port placement next week on 12/23.  He will have  chemo RT start 02/08/19. --RTC the week of 02/22/2019 to assess how he is tolerating therapy. Assure weekly labs in the interim with chemotherapy.   #Symptom Management --headache has improved, patient is not currently experiencing any pain --continue dexamethasone taper per rad/onc recommendations.  --will assure patient has an anti-nausea regimen prepared for when he starts therapy  --insominia: will prescribe mirtazepine 7.5 mg daily   #Active Smoker --patient is a  current active smoker,  interested in smoking cessation.  --provided with resources to help with quitting at last visit --continue to encourage smoking cessation.   #Illict Drug Use --patient used to smoke cocaine, though he has d/c drug use and has been clean x 1 month --continue to monitor  All questions were answered. The patient knows to call the clinic with any problems, questions or concerns.  A total of more than 25 minutes were spent on this encounter and over half of that time was spent on counseling and coordination of care as outlined above.   JLedell Peoples MD Department of Hematology/Oncology CEmpireat [facility] PulaskiPhone: [phone]: [phone]: [email or web address]  02/05/2019 4:36 PM

## 2019-02-05 ENCOUNTER — Inpatient Hospital Stay: Payer: Medicaid Other

## 2019-02-05 ENCOUNTER — Inpatient Hospital Stay (HOSPITAL_BASED_OUTPATIENT_CLINIC_OR_DEPARTMENT_OTHER): Payer: Medicaid Other | Admitting: Hematology and Oncology

## 2019-02-05 ENCOUNTER — Other Ambulatory Visit: Payer: Self-pay

## 2019-02-05 ENCOUNTER — Other Ambulatory Visit: Payer: Self-pay | Admitting: *Deleted

## 2019-02-05 VITALS — BP 146/85 | HR 95 | Temp 98.3°F | Resp 18 | Ht 72.0 in | Wt 175.9 lb

## 2019-02-05 DIAGNOSIS — Z5111 Encounter for antineoplastic chemotherapy: Secondary | ICD-10-CM | POA: Diagnosis not present

## 2019-02-05 DIAGNOSIS — C3492 Malignant neoplasm of unspecified part of left bronchus or lung: Secondary | ICD-10-CM | POA: Diagnosis not present

## 2019-02-05 DIAGNOSIS — C7931 Secondary malignant neoplasm of brain: Secondary | ICD-10-CM

## 2019-02-05 DIAGNOSIS — C3490 Malignant neoplasm of unspecified part of unspecified bronchus or lung: Secondary | ICD-10-CM | POA: Diagnosis not present

## 2019-02-05 DIAGNOSIS — R918 Other nonspecific abnormal finding of lung field: Secondary | ICD-10-CM

## 2019-02-05 LAB — CMP (CANCER CENTER ONLY)
ALT: 36 U/L (ref 0–44)
AST: 15 U/L (ref 15–41)
Albumin: 2.8 g/dL — ABNORMAL LOW (ref 3.5–5.0)
Alkaline Phosphatase: 85 U/L (ref 38–126)
Anion gap: 12 (ref 5–15)
BUN: 16 mg/dL (ref 6–20)
CO2: 23 mmol/L (ref 22–32)
Calcium: 8.5 mg/dL — ABNORMAL LOW (ref 8.9–10.3)
Chloride: 101 mmol/L (ref 98–111)
Creatinine: 0.94 mg/dL (ref 0.61–1.24)
GFR, Est AFR Am: >60 mL/min (ref >60)
GFR, Estimated: >60 mL/min (ref >60)
Glucose, Bld: 151 mg/dL — ABNORMAL HIGH (ref 70–99)
Potassium: 4.4 mmol/L (ref 3.5–5.1)
Sodium: 136 mmol/L (ref 135–145)
Total Bilirubin: 0.6 mg/dL (ref 0.3–1.2)
Total Protein: 6.7 g/dL (ref 6.5–8.1)

## 2019-02-05 LAB — CBC WITH DIFFERENTIAL (CANCER CENTER ONLY)
Abs Immature Granulocytes: 0.18 10*3/uL — ABNORMAL HIGH (ref 0.00–0.07)
Basophils Absolute: 0 10*3/uL (ref 0.0–0.1)
Basophils Relative: 0 %
Eosinophils Absolute: 0 10*3/uL (ref 0.0–0.5)
Eosinophils Relative: 0 %
HCT: 38.2 % — ABNORMAL LOW (ref 39.0–52.0)
Hemoglobin: 12.6 g/dL — ABNORMAL LOW (ref 13.0–17.0)
Immature Granulocytes: 2 %
Lymphocytes Relative: 12 %
Lymphs Abs: 1.3 10*3/uL (ref 0.7–4.0)
MCH: 27.6 pg (ref 26.0–34.0)
MCHC: 33 g/dL (ref 30.0–36.0)
MCV: 83.8 fL (ref 80.0–100.0)
Monocytes Absolute: 1 10*3/uL (ref 0.1–1.0)
Monocytes Relative: 9 %
Neutro Abs: 8.6 10*3/uL — ABNORMAL HIGH (ref 1.7–7.7)
Neutrophils Relative %: 77 %
Platelet Count: 255 10*3/uL (ref 150–400)
RBC: 4.56 MIL/uL (ref 4.22–5.81)
RDW: 17.3 % — ABNORMAL HIGH (ref 11.5–15.5)
WBC Count: 11.1 10*3/uL — ABNORMAL HIGH (ref 4.0–10.5)
nRBC: 0.4 % — ABNORMAL HIGH (ref 0.0–0.2)

## 2019-02-05 MED ORDER — LIDOCAINE-PRILOCAINE 2.5-2.5 % EX CREA: 1.0000 "application " | TOPICAL_CREAM | CUTANEOUS | 0 refills | Status: DC | PRN

## 2019-02-05 MED ORDER — ONDANSETRON HCL 8 MG PO TABS: 8.0000 mg | ORAL_TABLET | Freq: Three times a day (TID) | ORAL | 1 refills | Status: DC | PRN

## 2019-02-05 MED ORDER — ALBUTEROL SULFATE HFA 108 (90 BASE) MCG/ACT IN AERS: 2.0000 | INHALATION_SPRAY | Freq: Four times a day (QID) | RESPIRATORY_TRACT | 0 refills | Status: DC | PRN

## 2019-02-05 MED ORDER — PROCHLORPERAZINE MALEATE 10 MG PO TABS: 10.0000 mg | ORAL_TABLET | Freq: Four times a day (QID) | ORAL | 1 refills | Status: DC | PRN

## 2019-02-05 MED FILL — ALBUTEROL SULFATE HFA 108 (: 108 (90 BAS | 25 days supply | Qty: 18 | Fill #0

## 2019-02-05 MED FILL — LIDOCAINE-PRILOCAINE CREAM: 2.5-2.5 | 10 days supply | Qty: 30 | Fill #0

## 2019-02-05 MED FILL — ONDANSETRON HCL 8 MG TABLET: 8 | 6 days supply | Qty: 20 | Fill #0

## 2019-02-05 MED FILL — PROCHLORPERAZINE 10 MG TAB: 10 | 7 days supply | Qty: 30 | Fill #0

## 2019-02-07 NOTE — Progress Notes (Signed)
  Radiation Oncology         [phone]) [phone] ________________________________  Name: Micheal Vincent MRN: [id number]  Date: 01/22/2019  DOB: 1960/06/15  End of Treatment Note  Diagnosis:   58 yo man with a 22 mm solitary brain cerebellar metastasis.     Indication for treatment:  Local Control, Curative SRS for Oligometastasis       Radiation treatment dates:   01/22/19  Site/dose/beams/energy:     Micheal Vincent received stereotactic radiosurgery to the following targets: Right cerebellar 2.2 cm target was treated using 3 Rapid Arc VMAT Beams to a prescription dose of 18 Gy.  ExacTrac registration was performed for each couch angle.  The 100% isodose line was prescribed.  6 MV X-rays were delivered in the flattening filter free beam mode.  Narrative: The patient tolerated radiation treatment relatively well.     Plan: The patient has completed radiation treatment. The patient will return to radiation oncology clinic for routine followup in one month. I advised him to call or return sooner if he has any questions or concerns related to his recovery or treatment. ________________________________  Sheral Apley. Tammi Klippel, M.D.

## 2019-02-08 ENCOUNTER — Other Ambulatory Visit: Payer: Self-pay

## 2019-02-08 ENCOUNTER — Ambulatory Visit
Admission: RE | Admit: 2019-02-08 | Discharge: 2019-02-08 | Disposition: A | Discharge status: Home / Self Care | Payer: Medicaid Other | Source: Ambulatory Visit | Attending: Radiation Oncology | Admitting: Radiation Oncology

## 2019-02-08 ENCOUNTER — Inpatient Hospital Stay: Payer: Medicaid Other

## 2019-02-08 ENCOUNTER — Other Ambulatory Visit: Payer: Self-pay | Admitting: Hematology and Oncology

## 2019-02-08 ENCOUNTER — Encounter: Payer: Self-pay | Admitting: Hematology and Oncology

## 2019-02-08 VITALS — BP 145/84 | HR 75 | Temp 97.8°F | Resp 17 | Wt 182.8 lb

## 2019-02-08 DIAGNOSIS — Z510 Encounter for antineoplastic radiation therapy: Secondary | ICD-10-CM | POA: Diagnosis not present

## 2019-02-08 DIAGNOSIS — Z5111 Encounter for antineoplastic chemotherapy: Secondary | ICD-10-CM | POA: Diagnosis not present

## 2019-02-08 DIAGNOSIS — C3490 Malignant neoplasm of unspecified part of unspecified bronchus or lung: Secondary | ICD-10-CM

## 2019-02-08 MED ORDER — DIPHENHYDRAMINE HCL 50 MG/ML IJ SOLN
50.0000 mg | Freq: Once | INTRAMUSCULAR | Status: AC
  Administered 2019-02-08: 09:00:00 50 mg via INTRAVENOUS

## 2019-02-08 MED ORDER — PALONOSETRON HCL INJECTION 0.25 MG/5ML
0.2500 mg | Freq: Once | INTRAVENOUS | Status: AC
  Administered 2019-02-08: 09:00:00 0.25 mg via INTRAVENOUS

## 2019-02-08 MED ORDER — SODIUM CHLORIDE 0.9 % IV SOLN: 10.0000 mg | Freq: Once | INTRAVENOUS | Status: DC

## 2019-02-08 MED ORDER — PALONOSETRON HCL INJECTION 0.25 MG/5ML
INTRAVENOUS | Status: AC
  Filled 2019-02-08: qty 5

## 2019-02-08 MED ORDER — SODIUM CHLORIDE 0.9 % IV SOLN
234.8000 mg | Freq: Once | INTRAVENOUS | Status: AC
  Administered 2019-02-08: 12:00:00 230 mg via INTRAVENOUS
  Filled 2019-02-08: qty 23

## 2019-02-08 MED ORDER — SODIUM CHLORIDE 0.9 % IV SOLN
Freq: Once | INTRAVENOUS | Status: AC
  Filled 2019-02-08: qty 250

## 2019-02-08 MED ORDER — DEXAMETHASONE SODIUM PHOSPHATE 10 MG/ML IJ SOLN
10.0000 mg | Freq: Once | INTRAMUSCULAR | Status: AC
  Administered 2019-02-08: 09:00:00 10 mg via INTRAVENOUS

## 2019-02-08 MED ORDER — DEXAMETHASONE SODIUM PHOSPHATE 10 MG/ML IJ SOLN
INTRAMUSCULAR | Status: AC
  Filled 2019-02-08: qty 1

## 2019-02-08 MED ORDER — SODIUM CHLORIDE 0.9 % IV SOLN
45.0000 mg/m2 | Freq: Once | INTRAVENOUS | Status: AC
  Administered 2019-02-08: 10:00:00 90 mg via INTRAVENOUS
  Filled 2019-02-08: qty 15

## 2019-02-08 MED ORDER — FAMOTIDINE IN NACL 20-0.9 MG/50ML-% IV SOLN
INTRAVENOUS | Status: AC
  Filled 2019-02-08: qty 50

## 2019-02-08 MED ORDER — FAMOTIDINE IN NACL 20-0.9 MG/50ML-% IV SOLN
20.0000 mg | Freq: Once | INTRAVENOUS | Status: AC
  Administered 2019-02-08: 09:00:00 20 mg via INTRAVENOUS

## 2019-02-08 MED ORDER — DIPHENHYDRAMINE HCL 50 MG/ML IJ SOLN
INTRAMUSCULAR | Status: AC
  Filled 2019-02-08: qty 1

## 2019-02-08 NOTE — Progress Notes (Signed)
Met w/ pt to introduce myself as his Arboriculturist.  Pt has applied for Medicaid and it's pending.  I offered the [place] and went over what it covers.  Pt isn't working right now so he will provide a letter of support on 02/09/19.  Once approved I will give him an expense sheet.

## 2019-02-08 NOTE — Patient Instructions (Signed)
[facility] Discharge Instructions for Patients Receiving Chemotherapy  Today you received the following chemotherapy agents: Paclitaxel, Carboplatin  To help prevent nausea and vomiting after your treatment, we encourage you to take your nausea medication as directed.   If you develop nausea and vomiting that is not controlled by your nausea medication, call the clinic.   BELOW ARE SYMPTOMS THAT SHOULD BE REPORTED IMMEDIATELY:  *FEVER GREATER THAN 100.5 F  *CHILLS WITH OR WITHOUT FEVER  NAUSEA AND VOMITING THAT IS NOT CONTROLLED WITH YOUR NAUSEA MEDICATION  *UNUSUAL SHORTNESS OF BREATH  *UNUSUAL BRUISING OR BLEEDING  TENDERNESS IN MOUTH AND THROAT WITH OR WITHOUT PRESENCE OF ULCERS  *URINARY PROBLEMS  *BOWEL PROBLEMS  UNUSUAL RASH Items with * indicate a potential emergency and should be followed up as soon as possible.  Feel free to call the clinic should you have any questions or concerns. The clinic phone number is [phone].  Please show the [facility] at check-in to the Emergency Department and triage nurse.  Paclitaxel injection What is this medicine? PACLITAXEL (PAK li TAX el) is a chemotherapy drug. It targets fast dividing cells, like cancer cells, and causes these cells to die. This medicine is used to treat ovarian cancer, breast cancer, lung cancer, Kaposi's sarcoma, and other cancers. This medicine may be used for other purposes; ask your health care provider or pharmacist if you have questions. COMMON BRAND NAME(S): Onxol, Taxol What should I tell my health care provider before I take this medicine? They need to know if you have any of these conditions:  history of irregular heartbeat  liver disease  low blood counts, like low white cell, platelet, or red cell counts  lung or breathing disease, like asthma  tingling of the fingers or toes, or other nerve disorder  an unusual or allergic reaction to paclitaxel, alcohol,  polyoxyethylated castor oil, other chemotherapy, other medicines, foods, dyes, or preservatives  pregnant or trying to get pregnant  breast-feeding How should I use this medicine? This drug is given as an infusion into a vein. It is administered in a hospital or clinic by a specially trained health care professional. Talk to your pediatrician regarding the use of this medicine in children. Special care may be needed. Overdosage: If you think you have taken too much of this medicine contact a poison control center or emergency room at once. NOTE: This medicine is only for you. Do not share this medicine with others. What if I miss a dose? It is important not to miss your dose. Call your doctor or health care professional if you are unable to keep an appointment. What may interact with this medicine? Do not take this medicine with any of the following medications:  disulfiram  metronidazole This medicine may also interact with the following medications:  antiviral medicines for hepatitis, HIV or AIDS  certain antibiotics like erythromycin and clarithromycin  certain medicines for fungal infections like ketoconazole and itraconazole  certain medicines for seizures like carbamazepine, phenobarbital, phenytoin  gemfibrozil  nefazodone  rifampin  St. John's wort This list may not describe all possible interactions. Give your health care provider a list of all the medicines, herbs, non-prescription drugs, or dietary supplements you use. Also tell them if you smoke, drink alcohol, or use illegal drugs. Some items may interact with your medicine. What should I watch for while using this medicine? Your condition will be monitored carefully while you are receiving this medicine. You will need important blood  work done while you are taking this medicine. This medicine can cause serious allergic reactions. To reduce your risk you will need to take other medicine(s) before treatment with this  medicine. If you experience allergic reactions like skin rash, itching or hives, swelling of the face, lips, or tongue, tell your doctor or health care professional right away. In some cases, you may be given additional medicines to help with side effects. Follow all directions for their use. This drug may make you feel generally unwell. This is not uncommon, as chemotherapy can affect healthy cells as well as cancer cells. Report any side effects. Continue your course of treatment even though you feel ill unless your doctor tells you to stop. Call your doctor or health care professional for advice if you get a fever, chills or sore throat, or other symptoms of a cold or flu. Do not treat yourself. This drug decreases your body's ability to fight infections. Try to avoid being around people who are sick. This medicine may increase your risk to bruise or bleed. Call your doctor or health care professional if you notice any unusual bleeding. Be careful brushing and flossing your teeth or using a toothpick because you may get an infection or bleed more easily. If you have any dental work done, tell your dentist you are receiving this medicine. Avoid taking products that contain aspirin, acetaminophen, ibuprofen, naproxen, or ketoprofen unless instructed by your doctor. These medicines may hide a fever. Do not become pregnant while taking this medicine. Women should inform their doctor if they wish to become pregnant or think they might be pregnant. There is a potential for serious side effects to an unborn child. Talk to your health care professional or pharmacist for more information. Do not breast-feed an infant while taking this medicine. Men are advised not to father a child while receiving this medicine. This product may contain alcohol. Ask your pharmacist or healthcare provider if this medicine contains alcohol. Be sure to tell all healthcare providers you are taking this medicine. Certain medicines,  like metronidazole and disulfiram, can cause an unpleasant reaction when taken with alcohol. The reaction includes flushing, headache, nausea, vomiting, sweating, and increased thirst. The reaction can last from 30 minutes to several hours. What side effects may I notice from receiving this medicine? Side effects that you should report to your doctor or health care professional as soon as possible:  allergic reactions like skin rash, itching or hives, swelling of the face, lips, or tongue  breathing problems  changes in vision  fast, irregular heartbeat  high or low blood pressure  mouth sores  pain, tingling, numbness in the hands or feet  signs of decreased platelets or bleeding - bruising, pinpoint red spots on the skin, black, tarry stools, blood in the urine  signs of decreased red blood cells - unusually weak or tired, feeling faint or lightheaded, falls  signs of infection - fever or chills, cough, sore throat, pain or difficulty passing urine  signs and symptoms of liver injury like dark yellow or brown urine; general ill feeling or flu-like symptoms; light-colored stools; loss of appetite; nausea; right upper belly pain; unusually weak or tired; yellowing of the eyes or skin  swelling of the ankles, feet, hands  unusually slow heartbeat Side effects that usually do not require medical attention (report to your doctor or health care professional if they continue or are bothersome):  diarrhea  hair loss  loss of appetite  muscle or joint pain  nausea, vomiting  pain, redness, or irritation at site where injected  tiredness This list may not describe all possible side effects. Call your doctor for medical advice about side effects. You may report side effects to FDA at 1-800-FDA-1088. Where should I keep my medicine? This drug is given in a hospital or clinic and will not be stored at home. NOTE: This sheet is a summary. It may not cover all possible information.  If you have questions about this medicine, talk to your doctor, pharmacist, or health care provider.  2020 Elsevier/Gold Standard (2016-10-08 13:14:55)  Carboplatin injection What is this medicine? CARBOPLATIN (KAR boe pla tin) is a chemotherapy drug. It targets fast dividing cells, like cancer cells, and causes these cells to die. This medicine is used to treat ovarian cancer and many other cancers. This medicine may be used for other purposes; ask your health care provider or pharmacist if you have questions. COMMON BRAND NAME(S): Paraplatin What should I tell my health care provider before I take this medicine? They need to know if you have any of these conditions:  blood disorders  hearing problems  kidney disease  recent or ongoing radiation therapy  an unusual or allergic reaction to carboplatin, cisplatin, other chemotherapy, other medicines, foods, dyes, or preservatives  pregnant or trying to get pregnant  breast-feeding How should I use this medicine? This drug is usually given as an infusion into a vein. It is administered in a hospital or clinic by a specially trained health care professional. Talk to your pediatrician regarding the use of this medicine in children. Special care may be needed. Overdosage: If you think you have taken too much of this medicine contact a poison control center or emergency room at once. NOTE: This medicine is only for you. Do not share this medicine with others. What if I miss a dose? It is important not to miss a dose. Call your doctor or health care professional if you are unable to keep an appointment. What may interact with this medicine?  medicines for seizures  medicines to increase blood counts like filgrastim, pegfilgrastim, sargramostim  some antibiotics like amikacin, gentamicin, neomycin, streptomycin, tobramycin  vaccines Talk to your doctor or health care professional before taking any of these  medicines:  acetaminophen  aspirin  ibuprofen  ketoprofen  naproxen This list may not describe all possible interactions. Give your health care provider a list of all the medicines, herbs, non-prescription drugs, or dietary supplements you use. Also tell them if you smoke, drink alcohol, or use illegal drugs. Some items may interact with your medicine. What should I watch for while using this medicine? Your condition will be monitored carefully while you are receiving this medicine. You will need important blood work done while you are taking this medicine. This drug may make you feel generally unwell. This is not uncommon, as chemotherapy can affect healthy cells as well as cancer cells. Report any side effects. Continue your course of treatment even though you feel ill unless your doctor tells you to stop. In some cases, you may be given additional medicines to help with side effects. Follow all directions for their use. Call your doctor or health care professional for advice if you get a fever, chills or sore throat, or other symptoms of a cold or flu. Do not treat yourself. This drug decreases your body's ability to fight infections. Try to avoid being around people who are sick. This medicine may increase your risk to bruise or bleed.  Call your doctor or health care professional if you notice any unusual bleeding. Be careful brushing and flossing your teeth or using a toothpick because you may get an infection or bleed more easily. If you have any dental work done, tell your dentist you are receiving this medicine. Avoid taking products that contain aspirin, acetaminophen, ibuprofen, naproxen, or ketoprofen unless instructed by your doctor. These medicines may hide a fever. Do not become pregnant while taking this medicine. Women should inform their doctor if they wish to become pregnant or think they might be pregnant. There is a potential for serious side effects to an unborn child. Talk  to your health care professional or pharmacist for more information. Do not breast-feed an infant while taking this medicine. What side effects may I notice from receiving this medicine? Side effects that you should report to your doctor or health care professional as soon as possible:  allergic reactions like skin rash, itching or hives, swelling of the face, lips, or tongue  signs of infection - fever or chills, cough, sore throat, pain or difficulty passing urine  signs of decreased platelets or bleeding - bruising, pinpoint red spots on the skin, black, tarry stools, nosebleeds  signs of decreased red blood cells - unusually weak or tired, fainting spells, lightheadedness  breathing problems  changes in hearing  changes in vision  chest pain  high blood pressure  low blood counts - This drug may decrease the number of white blood cells, red blood cells and platelets. You may be at increased risk for infections and bleeding.  nausea and vomiting  pain, swelling, redness or irritation at the injection site  pain, tingling, numbness in the hands or feet  problems with balance, talking, walking  trouble passing urine or change in the amount of urine Side effects that usually do not require medical attention (report to your doctor or health care professional if they continue or are bothersome):  hair loss  loss of appetite  metallic taste in the mouth or changes in taste This list may not describe all possible side effects. Call your doctor for medical advice about side effects. You may report side effects to FDA at 1-800-FDA-1088. Where should I keep my medicine? This drug is given in a hospital or clinic and will not be stored at home. NOTE: This sheet is a summary. It may not cover all possible information. If you have questions about this medicine, talk to your doctor, pharmacist, or health care provider.  2020 Elsevier/Gold Standard (2007-05-12 14:38:05)

## 2019-02-09 ENCOUNTER — Ambulatory Visit
Admission: RE | Admit: 2019-02-09 | Discharge: 2019-02-09 | Disposition: A | Discharge status: Home / Self Care | Payer: Medicaid Other | Source: Ambulatory Visit | Attending: Radiation Oncology | Admitting: Radiation Oncology

## 2019-02-09 ENCOUNTER — Telehealth: Payer: Self-pay | Admitting: *Deleted

## 2019-02-09 ENCOUNTER — Other Ambulatory Visit: Payer: Self-pay

## 2019-02-09 ENCOUNTER — Other Ambulatory Visit: Payer: Self-pay | Admitting: Radiology

## 2019-02-09 DIAGNOSIS — Z510 Encounter for antineoplastic radiation therapy: Secondary | ICD-10-CM | POA: Diagnosis not present

## 2019-02-10 ENCOUNTER — Inpatient Hospital Stay (HOSPITAL_COMMUNITY): Admission: RE | Admit: 2019-02-10 | Payer: Self-pay | Source: Ambulatory Visit

## 2019-02-10 ENCOUNTER — Telehealth: Payer: Self-pay | Admitting: *Deleted

## 2019-02-10 ENCOUNTER — Ambulatory Visit
Admission: RE | Admit: 2019-02-10 | Discharge: 2019-02-10 | Disposition: A | Discharge status: Home / Self Care | Payer: Medicaid Other | Source: Ambulatory Visit | Attending: Radiation Oncology | Admitting: Radiation Oncology

## 2019-02-10 ENCOUNTER — Ambulatory Visit (HOSPITAL_COMMUNITY): Payer: Self-pay

## 2019-02-10 ENCOUNTER — Other Ambulatory Visit: Payer: Self-pay

## 2019-02-10 DIAGNOSIS — Z510 Encounter for antineoplastic radiation therapy: Secondary | ICD-10-CM | POA: Diagnosis not present

## 2019-02-10 NOTE — Telephone Encounter (Signed)
Received vm call from Tiffany/IR stating that pt did not show for port placement.  She called him & he stated that he had been at Radiation Oncology & his legs were bothering him so he left.  She rescheduled him for next thurs 12/31 which is next available & pt is aware.  Message routed to Dr Lorenso Courier.

## 2019-02-11 ENCOUNTER — Telehealth: Payer: Self-pay | Admitting: Radiation Therapy

## 2019-02-11 ENCOUNTER — Telehealth: Payer: Self-pay | Admitting: Emergency Medicine

## 2019-02-11 ENCOUNTER — Other Ambulatory Visit: Payer: Self-pay

## 2019-02-11 ENCOUNTER — Encounter: Payer: Self-pay | Admitting: Medical

## 2019-02-11 ENCOUNTER — Ambulatory Visit: Payer: Medicaid Other

## 2019-02-11 DIAGNOSIS — C3490 Malignant neoplasm of unspecified part of unspecified bronchus or lung: Secondary | ICD-10-CM

## 2019-02-11 NOTE — Telephone Encounter (Signed)
Received call from transport services on behalf of patient due for radiation appt at 1330 this afternoon (02/11/2019), states that pt is reporting a temperature of 99.2 at home with fatigue and mild SOB.  No report of Covid exposure, no other complaints.  Transport services states they would contact pt back with advice to continue to monitor temperature/symptoms and to f/u as needed before his radiation appt this afternoon, otherwise to continue to radiation appt as planned.  (Pt requires transport services to and from hospital which makes it difficult for him to come in early for [facility] assessment).  PA Lucianne Lei aware, pt placed on [facility] schedule for tentative appt after his radiation appt today.  Reviewed telephone note about pt's radiation appt today where pt called to cancel with temp of 101 and reported covid exposure.  [facility] appt cancelled, [facility] aware.

## 2019-02-11 NOTE — Telephone Encounter (Addendum)
Micheal Vincent called to cancel his radiation appointment today stating that he has a fever and is worried about a possibility of having Covid. He said that he feels fine, no other symptoms, but has a fever of 101 after visiting his daughter in law on Sunday 12/20, who does have Covid (said that he stayed 20 ft away from her and didn't get in her face) . I shared this with his treatment therapists and Dr. Johny Shears nurse. [name] RN. I also told Micheal Vincent that he will need to get tested prior to coming back in on Monday to ensure that he does not have Covid. He understands and plans to visit a testing center, he was given the phone number to call and make an appointment for this.  [place] R.T.(R)(T) Radiation Special Procedures Navigator

## 2019-02-13 ENCOUNTER — Encounter (HOSPITAL_COMMUNITY): Payer: Self-pay | Admitting: Emergency Medicine

## 2019-02-13 ENCOUNTER — Other Ambulatory Visit: Payer: Self-pay

## 2019-02-13 ENCOUNTER — Emergency Department (HOSPITAL_COMMUNITY): Payer: Medicaid Other

## 2019-02-13 ENCOUNTER — Emergency Department (HOSPITAL_COMMUNITY)
Admission: EM | Admit: 2019-02-13 | Discharge: 2019-02-14 | Disposition: A | Discharge status: Home / Self Care | Payer: Medicaid Other | Attending: Emergency Medicine | Admitting: Emergency Medicine

## 2019-02-13 DIAGNOSIS — Z79899 Other long term (current) drug therapy: Secondary | ICD-10-CM | POA: Diagnosis not present

## 2019-02-13 DIAGNOSIS — Z85118 Personal history of other malignant neoplasm of bronchus and lung: Secondary | ICD-10-CM | POA: Insufficient documentation

## 2019-02-13 DIAGNOSIS — F1210 Cannabis abuse, uncomplicated: Secondary | ICD-10-CM | POA: Insufficient documentation

## 2019-02-13 DIAGNOSIS — F17210 Nicotine dependence, cigarettes, uncomplicated: Secondary | ICD-10-CM | POA: Insufficient documentation

## 2019-02-13 DIAGNOSIS — Z9221 Personal history of antineoplastic chemotherapy: Secondary | ICD-10-CM | POA: Diagnosis not present

## 2019-02-13 DIAGNOSIS — F1410 Cocaine abuse, uncomplicated: Secondary | ICD-10-CM | POA: Diagnosis not present

## 2019-02-13 DIAGNOSIS — Z20828 Contact with and (suspected) exposure to other viral communicable diseases: Secondary | ICD-10-CM | POA: Insufficient documentation

## 2019-02-13 DIAGNOSIS — J989 Respiratory disorder, unspecified: Secondary | ICD-10-CM | POA: Insufficient documentation

## 2019-02-13 DIAGNOSIS — E871 Hypo-osmolality and hyponatremia: Secondary | ICD-10-CM | POA: Diagnosis not present

## 2019-02-13 DIAGNOSIS — R509 Fever, unspecified: Secondary | ICD-10-CM | POA: Diagnosis present

## 2019-02-13 LAB — CBC WITH DIFFERENTIAL/PLATELET
Abs Immature Granulocytes: 0.27 K/uL — ABNORMAL HIGH (ref 0.00–0.07)
Basophils Absolute: 0.1 K/uL (ref 0.0–0.1)
Basophils Relative: 0 %
Eosinophils Absolute: 0 K/uL (ref 0.0–0.5)
Eosinophils Relative: 0 %
HCT: 41.3 % (ref 39.0–52.0)
Hemoglobin: 13.5 g/dL (ref 13.0–17.0)
Immature Granulocytes: 2 %
Lymphocytes Relative: 7 %
Lymphs Abs: 0.8 K/uL (ref 0.7–4.0)
MCH: 28.1 pg (ref 26.0–34.0)
MCHC: 32.7 g/dL (ref 30.0–36.0)
MCV: 86 fL (ref 80.0–100.0)
Monocytes Absolute: 0.8 K/uL (ref 0.1–1.0)
Monocytes Relative: 7 %
Neutro Abs: 9.8 K/uL — ABNORMAL HIGH (ref 1.7–7.7)
Neutrophils Relative %: 84 %
Platelets: 367 K/uL (ref 150–400)
RBC: 4.8 MIL/uL (ref 4.22–5.81)
RDW: 17.8 % — ABNORMAL HIGH (ref 11.5–15.5)
WBC: 11.6 K/uL — ABNORMAL HIGH (ref 4.0–10.5)
nRBC: 0.2 % (ref 0.0–0.2)

## 2019-02-13 LAB — BASIC METABOLIC PANEL
Anion gap: 12 (ref 5–15)
BUN: 19 mg/dL (ref 6–20)
CO2: 21 mmol/L — ABNORMAL LOW (ref 22–32)
Calcium: 8.6 mg/dL — ABNORMAL LOW (ref 8.9–10.3)
Chloride: 94 mmol/L — ABNORMAL LOW (ref 98–111)
Creatinine, Ser: 0.83 mg/dL (ref 0.61–1.24)
GFR calc Af Amer: >60 mL/min (ref >60)
GFR calc non Af Amer: >60 mL/min (ref >60)
Glucose, Bld: 131 mg/dL — ABNORMAL HIGH (ref 70–99)
Potassium: 4.2 mmol/L (ref 3.5–5.1)
Sodium: 127 mmol/L — ABNORMAL LOW (ref 135–145)

## 2019-02-13 LAB — POC SARS CORONAVIRUS 2 AG -  ED: SARS Coronavirus 2 Ag: NEGATIVE

## 2019-02-13 MED ORDER — HYDROCODONE-ACETAMINOPHEN 5-325 MG PO TABS
1.0000 | ORAL_TABLET | Freq: Once | ORAL | Status: AC
  Administered 2019-02-13: 1 via ORAL
  Filled 2019-02-13: qty 1

## 2019-02-13 NOTE — ED Triage Notes (Addendum)
Pt presents by [facility] for evaluation of fever and is cancer pt currently under chemo and radiation for lung cancer. Pt states last treatment was 02/10/2019. Pt reports having been exposed to persons with [place] recently.

## 2019-02-13 NOTE — ED Provider Notes (Signed)
10:56 PM 58 y/o male with hx of lung CA presents for cough, fever with recent COVID exposure. Afebrile in the ED without need for supplemental oxygen. Plan discussed with Tomi Bamberger, MD which includes discharge pending COVID test. His leukocytosis is stable.   Rapid COVID negative today. Will send PCR for confirmation given ongoing chemotherapy and radiation. Advised f/u with his PCP and/or oncologist. Return precautions discussed and provided. Patient discharged in stable condition with no unaddressed concerns.   Antonietta Breach, PA-C 02/13/19 [id number]    Milton Ferguson, MD 02/14/19 2217

## 2019-02-13 NOTE — Discharge Instructions (Addendum)
Drink plenty of fluids to prevent dehydration.  Continue with Tylenol as needed for management of fever.  Your rapid Covid test was negative in the emergency department.  An additional Covid test was sent and should result in 24 to 48 hours.  Continue your daily prescribed medications and follow-up with your primary care doctor and/or oncologist.  Return for any new or concerning symptoms such as chest pain, shortness of breath, loss of consciousness, coughing up blood.
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
## 2019-02-13 NOTE — ED Notes (Signed)
[facility] contacted for transport home. Paperwork printed and at nursing station.

## 2019-02-14 NOTE — ED Notes (Signed)
[facility] at bedside to transport patient home. Patient in NAD at this time.

## 2019-02-15 ENCOUNTER — Inpatient Hospital Stay: Payer: Medicaid Other

## 2019-02-15 ENCOUNTER — Other Ambulatory Visit: Payer: Self-pay

## 2019-02-15 ENCOUNTER — Ambulatory Visit: Payer: Medicaid Other

## 2019-02-15 ENCOUNTER — Other Ambulatory Visit: Payer: Self-pay | Admitting: Internal Medicine

## 2019-02-15 DIAGNOSIS — C3490 Malignant neoplasm of unspecified part of unspecified bronchus or lung: Secondary | ICD-10-CM

## 2019-02-15 DIAGNOSIS — Z5111 Encounter for antineoplastic chemotherapy: Secondary | ICD-10-CM | POA: Diagnosis not present

## 2019-02-15 DIAGNOSIS — Z95828 Presence of other vascular implants and grafts: Secondary | ICD-10-CM

## 2019-02-15 LAB — CBC WITH DIFFERENTIAL (CANCER CENTER ONLY)
Abs Immature Granulocytes: 0.12 10*3/uL — ABNORMAL HIGH (ref 0.00–0.07)
Basophils Absolute: 0 10*3/uL (ref 0.0–0.1)
Basophils Relative: 0 %
Eosinophils Absolute: 0 10*3/uL (ref 0.0–0.5)
Eosinophils Relative: 0 %
HCT: 35.9 % — ABNORMAL LOW (ref 39.0–52.0)
Hemoglobin: 11.8 g/dL — ABNORMAL LOW (ref 13.0–17.0)
Immature Granulocytes: 1 %
Lymphocytes Relative: 11 %
Lymphs Abs: 1 10*3/uL (ref 0.7–4.0)
MCH: 27.4 pg (ref 26.0–34.0)
MCHC: 32.9 g/dL (ref 30.0–36.0)
MCV: 83.5 fL (ref 80.0–100.0)
Monocytes Absolute: 0.7 10*3/uL (ref 0.1–1.0)
Monocytes Relative: 8 %
Neutro Abs: 6.6 10*3/uL (ref 1.7–7.7)
Neutrophils Relative %: 80 %
Platelet Count: 379 10*3/uL (ref 150–400)
RBC: 4.3 MIL/uL (ref 4.22–5.81)
RDW: 17.1 % — ABNORMAL HIGH (ref 11.5–15.5)
WBC Count: 8.4 10*3/uL (ref 4.0–10.5)
nRBC: 0 % (ref 0.0–0.2)

## 2019-02-15 LAB — NOVEL CORONAVIRUS, NAA (HOSP ORDER, SEND-OUT TO REF LAB; TAT 18-24 HRS): SARS-CoV-2, NAA: NOT DETECTED

## 2019-02-15 LAB — CMP (CANCER CENTER ONLY)
ALT: 60 U/L — ABNORMAL HIGH (ref 0–44)
AST: 27 U/L (ref 15–41)
Albumin: 2.5 g/dL — ABNORMAL LOW (ref 3.5–5.0)
Alkaline Phosphatase: 98 U/L (ref 38–126)
Anion gap: 12 (ref 5–15)
BUN: 16 mg/dL (ref 6–20)
CO2: 20 mmol/L — ABNORMAL LOW (ref 22–32)
Calcium: 8.8 mg/dL — ABNORMAL LOW (ref 8.9–10.3)
Chloride: 99 mmol/L (ref 98–111)
Creatinine: 0.82 mg/dL (ref 0.61–1.24)
GFR, Est AFR Am: >60 mL/min (ref >60)
GFR, Estimated: >60 mL/min (ref >60)
Glucose, Bld: 99 mg/dL (ref 70–99)
Potassium: 3.9 mmol/L (ref 3.5–5.1)
Sodium: 131 mmol/L — ABNORMAL LOW (ref 135–145)
Total Bilirubin: 0.9 mg/dL (ref 0.3–1.2)
Total Protein: 7 g/dL (ref 6.5–8.1)

## 2019-02-15 MED ORDER — SODIUM CHLORIDE 0.9 % IV SOLN
INTRAVENOUS | Status: DC
  Filled 2019-02-15 (×2): qty 250

## 2019-02-15 MED ORDER — DIPHENHYDRAMINE HCL 50 MG/ML IJ SOLN
50.0000 mg | Freq: Once | INTRAMUSCULAR | Status: AC
  Administered 2019-02-15: 10:00:00 50 mg via INTRAVENOUS

## 2019-02-15 MED ORDER — DIPHENHYDRAMINE HCL 50 MG/ML IJ SOLN
INTRAMUSCULAR | Status: AC
  Filled 2019-02-15: qty 1

## 2019-02-15 MED ORDER — SODIUM CHLORIDE 0.9 % IV SOLN
45.0000 mg/m2 | Freq: Once | INTRAVENOUS | Status: AC
  Administered 2019-02-15: 11:00:00 90 mg via INTRAVENOUS
  Filled 2019-02-15: qty 15

## 2019-02-15 MED ORDER — FAMOTIDINE IN NACL 20-0.9 MG/50ML-% IV SOLN
20.0000 mg | Freq: Once | INTRAVENOUS | Status: AC
  Administered 2019-02-15: 10:00:00 20 mg via INTRAVENOUS

## 2019-02-15 MED ORDER — FAMOTIDINE IN NACL 20-0.9 MG/50ML-% IV SOLN
INTRAVENOUS | Status: AC
  Filled 2019-02-15: qty 50

## 2019-02-15 MED ORDER — SODIUM CHLORIDE 0.9% FLUSH
10.0000 mL | INTRAVENOUS | Status: DC | PRN
  Filled 2019-02-15: qty 10

## 2019-02-15 MED ORDER — PALONOSETRON HCL INJECTION 0.25 MG/5ML
0.2500 mg | Freq: Once | INTRAVENOUS | Status: AC
  Administered 2019-02-15: 10:00:00 0.25 mg via INTRAVENOUS

## 2019-02-15 MED ORDER — DEXAMETHASONE SODIUM PHOSPHATE 10 MG/ML IJ SOLN
10.0000 mg | Freq: Once | INTRAMUSCULAR | Status: AC
  Administered 2019-02-15: 10 mg via INTRAVENOUS

## 2019-02-15 MED ORDER — DEXAMETHASONE SODIUM PHOSPHATE 10 MG/ML IJ SOLN
INTRAMUSCULAR | Status: AC
  Filled 2019-02-15: qty 1

## 2019-02-15 MED ORDER — PALONOSETRON HCL INJECTION 0.25 MG/5ML
INTRAVENOUS | Status: AC
  Filled 2019-02-15: qty 5

## 2019-02-15 MED ORDER — SODIUM CHLORIDE 0.9 % IV SOLN
262.0000 mg | Freq: Once | INTRAVENOUS | Status: AC
  Administered 2019-02-15: 12:00:00 260 mg via INTRAVENOUS
  Filled 2019-02-15: qty 26

## 2019-02-15 MED ORDER — SODIUM CHLORIDE 0.9 % IV SOLN
Freq: Once | INTRAVENOUS | Status: DC
  Filled 2019-02-15: qty 250

## 2019-02-15 NOTE — Progress Notes (Signed)
Pt reports leg pain bilaterally. Pulse 112. Was seen in ER for fever on 12/26 Rapid COVID negative. Dr. Earlie Server made aware. OK to treat today. 500 cc .9 NS added to treatment today.

## 2019-02-15 NOTE — Patient Instructions (Signed)
[facility] Discharge Instructions for Patients Receiving Chemotherapy  Today you received the following chemotherapy agents: Paclitaxel, Carboplatin  To help prevent nausea and vomiting after your treatment, we encourage you to take your nausea medication as directed.   If you develop nausea and vomiting that is not controlled by your nausea medication, call the clinic.   BELOW ARE SYMPTOMS THAT SHOULD BE REPORTED IMMEDIATELY:  *FEVER GREATER THAN 100.5 F  *CHILLS WITH OR WITHOUT FEVER  NAUSEA AND VOMITING THAT IS NOT CONTROLLED WITH YOUR NAUSEA MEDICATION  *UNUSUAL SHORTNESS OF BREATH  *UNUSUAL BRUISING OR BLEEDING  TENDERNESS IN MOUTH AND THROAT WITH OR WITHOUT PRESENCE OF ULCERS  *URINARY PROBLEMS  *BOWEL PROBLEMS  UNUSUAL RASH Items with * indicate a potential emergency and should be followed up as soon as possible.  Feel free to call the clinic should you have any questions or concerns. The clinic phone number is [phone].  Please show the [place] at check-in to the Emergency Department and triage nurse.

## 2019-02-16 ENCOUNTER — Ambulatory Visit
Admission: RE | Admit: 2019-02-16 | Discharge: 2019-02-16 | Disposition: A | Discharge status: Home / Self Care | Payer: Medicaid Other | Source: Ambulatory Visit | Attending: Radiation Oncology | Admitting: Radiation Oncology

## 2019-02-16 ENCOUNTER — Other Ambulatory Visit: Payer: Self-pay

## 2019-02-16 ENCOUNTER — Other Ambulatory Visit: Payer: Self-pay | Admitting: *Deleted

## 2019-02-16 DIAGNOSIS — C7949 Secondary malignant neoplasm of other parts of nervous system: Secondary | ICD-10-CM

## 2019-02-16 DIAGNOSIS — Z510 Encounter for antineoplastic radiation therapy: Secondary | ICD-10-CM | POA: Diagnosis not present

## 2019-02-16 DIAGNOSIS — C7931 Secondary malignant neoplasm of brain: Secondary | ICD-10-CM

## 2019-02-17 ENCOUNTER — Other Ambulatory Visit: Payer: Self-pay | Admitting: Radiology

## 2019-02-17 ENCOUNTER — Ambulatory Visit
Admission: RE | Admit: 2019-02-17 | Discharge: 2019-02-17 | Disposition: A | Discharge status: Home / Self Care | Payer: Medicaid Other | Source: Ambulatory Visit | Attending: Radiation Oncology | Admitting: Radiation Oncology

## 2019-02-17 ENCOUNTER — Other Ambulatory Visit: Payer: Self-pay

## 2019-02-17 DIAGNOSIS — Z510 Encounter for antineoplastic radiation therapy: Secondary | ICD-10-CM | POA: Diagnosis not present

## 2019-02-18 ENCOUNTER — Encounter (HOSPITAL_COMMUNITY): Payer: Self-pay

## 2019-02-18 ENCOUNTER — Ambulatory Visit
Admission: RE | Admit: 2019-02-18 | Discharge: 2019-02-18 | Disposition: A | Discharge status: Home / Self Care | Payer: Medicaid Other | Source: Ambulatory Visit | Attending: Radiation Oncology | Admitting: Radiation Oncology

## 2019-02-18 ENCOUNTER — Other Ambulatory Visit: Payer: Self-pay | Admitting: Radiation Oncology

## 2019-02-18 ENCOUNTER — Other Ambulatory Visit: Payer: Self-pay | Admitting: Hematology and Oncology

## 2019-02-18 ENCOUNTER — Other Ambulatory Visit: Payer: Self-pay

## 2019-02-18 ENCOUNTER — Ambulatory Visit (HOSPITAL_COMMUNITY)
Admission: RE | Admit: 2019-02-18 | Discharge: 2019-02-18 | Disposition: A | Discharge status: Home / Self Care | Payer: Medicaid Other | Source: Ambulatory Visit | Attending: Hematology and Oncology | Admitting: Hematology and Oncology

## 2019-02-18 ENCOUNTER — Other Ambulatory Visit: Payer: Self-pay | Admitting: *Deleted

## 2019-02-18 DIAGNOSIS — C7931 Secondary malignant neoplasm of brain: Secondary | ICD-10-CM | POA: Diagnosis not present

## 2019-02-18 DIAGNOSIS — C3490 Malignant neoplasm of unspecified part of unspecified bronchus or lung: Secondary | ICD-10-CM | POA: Diagnosis present

## 2019-02-18 DIAGNOSIS — Z7982 Long term (current) use of aspirin: Secondary | ICD-10-CM | POA: Diagnosis not present

## 2019-02-18 DIAGNOSIS — Z79899 Other long term (current) drug therapy: Secondary | ICD-10-CM | POA: Diagnosis not present

## 2019-02-18 DIAGNOSIS — Z809 Family history of malignant neoplasm, unspecified: Secondary | ICD-10-CM | POA: Diagnosis not present

## 2019-02-18 DIAGNOSIS — Z510 Encounter for antineoplastic radiation therapy: Secondary | ICD-10-CM | POA: Diagnosis not present

## 2019-02-18 DIAGNOSIS — F17210 Nicotine dependence, cigarettes, uncomplicated: Secondary | ICD-10-CM | POA: Diagnosis not present

## 2019-02-18 DIAGNOSIS — Z8249 Family history of ischemic heart disease and other diseases of the circulatory system: Secondary | ICD-10-CM | POA: Diagnosis not present

## 2019-02-18 HISTORY — PX: IR IMAGING GUIDED PORT INSERTION: IMG5740

## 2019-02-18 LAB — CBC WITH DIFFERENTIAL/PLATELET
Abs Immature Granulocytes: 0.12 10*3/uL — ABNORMAL HIGH (ref 0.00–0.07)
Basophils Absolute: 0 10*3/uL (ref 0.0–0.1)
Basophils Relative: 0 %
Eosinophils Absolute: 0 10*3/uL (ref 0.0–0.5)
Eosinophils Relative: 0 %
HCT: 39.1 % (ref 39.0–52.0)
Hemoglobin: 12.9 g/dL — ABNORMAL LOW (ref 13.0–17.0)
Immature Granulocytes: 2 %
Lymphocytes Relative: 13 %
Lymphs Abs: 0.8 10*3/uL (ref 0.7–4.0)
MCH: 28.2 pg (ref 26.0–34.0)
MCHC: 33 g/dL (ref 30.0–36.0)
MCV: 85.4 fL (ref 80.0–100.0)
Monocytes Absolute: 0.2 10*3/uL (ref 0.1–1.0)
Monocytes Relative: 4 %
Neutro Abs: 5.3 10*3/uL (ref 1.7–7.7)
Neutrophils Relative %: 81 %
Platelets: 452 10*3/uL — ABNORMAL HIGH (ref 150–400)
RBC: 4.58 MIL/uL (ref 4.22–5.81)
RDW: 17.5 % — ABNORMAL HIGH (ref 11.5–15.5)
WBC: 6.6 10*3/uL (ref 4.0–10.5)
nRBC: 0.9 % — ABNORMAL HIGH (ref 0.0–0.2)

## 2019-02-18 LAB — PROTIME-INR
INR: 1.2 (ref 0.8–1.2)
Prothrombin Time: 14.6 seconds (ref 11.4–15.2)

## 2019-02-18 MED ORDER — HEPARIN SOD (PORK) LOCK FLUSH 100 UNIT/ML IV SOLN
INTRAVENOUS | Status: AC | PRN
  Administered 2019-02-18: 500 [IU] via INTRAVENOUS

## 2019-02-18 MED ORDER — SODIUM CHLORIDE 0.9 % IV SOLN: INTRAVENOUS | Status: DC

## 2019-02-18 MED ORDER — DIPHENHYDRAMINE HCL 50 MG/ML IJ SOLN
INTRAMUSCULAR | Status: AC
  Administered 2019-02-18: 15:00:00 25 mg
  Filled 2019-02-18: qty 1

## 2019-02-18 MED ORDER — LIDOCAINE HCL (PF) 1 % IJ SOLN
INTRAMUSCULAR | Status: AC | PRN
  Administered 2019-02-18: 10 mL

## 2019-02-18 MED ORDER — CEFAZOLIN SODIUM-DEXTROSE 2-4 GM/100ML-% IV SOLN
INTRAVENOUS | Status: AC
  Administered 2019-02-18: 2 g via INTRAVENOUS
  Filled 2019-02-18: qty 100

## 2019-02-18 MED ORDER — MIDAZOLAM HCL 2 MG/2ML IJ SOLN
INTRAMUSCULAR | Status: AC
  Filled 2019-02-18: qty 4

## 2019-02-18 MED ORDER — LIDOCAINE-EPINEPHRINE (PF) 2 %-1:200000 IJ SOLN
INTRAMUSCULAR | Status: AC | PRN
  Administered 2019-02-18: 10 mL

## 2019-02-18 MED ORDER — LIDOCAINE-EPINEPHRINE (PF) 2 %-1:200000 IJ SOLN
INTRAMUSCULAR | Status: AC
  Filled 2019-02-18: qty 20

## 2019-02-18 MED ORDER — LIDOCAINE HCL 1 % IJ SOLN
INTRAMUSCULAR | Status: AC
  Filled 2019-02-18: qty 20

## 2019-02-18 MED ORDER — IBUPROFEN 200 MG PO TABS: 400.0000 mg | ORAL_TABLET | Freq: Two times a day (BID) | ORAL | 3 refills | Status: DC | PRN

## 2019-02-18 MED ORDER — FENTANYL CITRATE (PF) 100 MCG/2ML IJ SOLN
INTRAMUSCULAR | Status: AC
  Filled 2019-02-18: qty 2

## 2019-02-18 MED ORDER — MIDAZOLAM HCL 2 MG/2ML IJ SOLN
INTRAMUSCULAR | Status: AC | PRN
  Administered 2019-02-18 (×2): 1 mg via INTRAVENOUS

## 2019-02-18 MED ORDER — CEFAZOLIN SODIUM-DEXTROSE 2-4 GM/100ML-% IV SOLN: 2.0000 g | INTRAVENOUS | Status: AC

## 2019-02-18 MED ORDER — HEPARIN SOD (PORK) LOCK FLUSH 100 UNIT/ML IV SOLN
INTRAVENOUS | Status: AC
  Filled 2019-02-18: qty 5

## 2019-02-18 MED ORDER — FENTANYL CITRATE (PF) 100 MCG/2ML IJ SOLN
INTRAMUSCULAR | Status: AC | PRN
  Administered 2019-02-18 (×2): 50 ug via INTRAVENOUS

## 2019-02-18 MED FILL — IBUPROFEN 200 MG TABLET: 200 | 25 days supply | Qty: 100 | Fill #0

## 2019-02-18 NOTE — Progress Notes (Signed)
Patient seen today for weekly on treatment visit with radiation.  He describes shin pain responsive to aspirin.  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  I recommended Ibuprofen 400 BID for this.

## 2019-02-18 NOTE — Discharge Instructions (Addendum)
Urgent needs: IR MD on call [phone]  Implanted Port Insertion, Care After This sheet gives you information about how to care for yourself after your procedure. Your health care provider may also give you more specific instructions. If you have problems or questions, contact your health care provider. What can I expect after the procedure? After the procedure, it is common to have:  Discomfort at the port insertion site.  Bruising on the skin over the port. This should improve over 3-4 days. Follow these instructions at home: [facility] care  After your port is placed, you will get a manufacturer's information card. The card has information about your port. Keep this card with you at all times.  Take care of the port as told by your health care provider. Ask your health care provider if you or a family member can get training for taking care of the port at home. A home health care nurse may also take care of the port.  Make sure to remember what type of port you have. Incision care  Follow instructions from your health care provider about how to take care of your port insertion site. Make sure you: ? Wash your hands with soap and water before and after you change your bandage (dressing). If soap and water are not available, use hand sanitizer. ? Change your dressing as told by your health care provider.  May remove dressing and shower in 24 to 48 hours.  Keep site clean and dry.  Replace dressing with bandaid as necessary. ? Leave stitches (sutures), skin glue, or adhesive strips in place. These skin closures may need to stay in place for 2 weeks or longer. If adhesive strip edges start to loosen and curl up, you may trim the loose edges. Do not remove adhesive strips completely unless your health care provider tells you to do that.  Check your port insertion site every day for signs of infection. Check for: ? Redness, swelling, or pain. ? Fluid or blood. ? Warmth. ? Pus or a bad  smell. Activity  Return to your normal activities as told by your health care provider. Ask your health care provider what activities are safe for you.  Do not lift anything that is heavier than 10 lb (4.5 kg), or the limit that you are told, until your health care provider says that it is safe. General instructions  Take over-the-counter and prescription medicines only as told by your health care provider.  Do not take baths, swim, or use a hot tub until your health care provider approves. Ask your health care provider if you may take showers. You may only be allowed to take sponge baths.  Do not drive for 24 hours if you were given a sedative during your procedure.  Wear a medical alert bracelet in case of an emergency. This will tell any health care providers that you have a port.  Keep all follow-up visits as told by your health care provider. This is important. Contact a health care provider if:  You cannot flush your port with saline as directed, or you cannot draw blood from the port.  You have a fever or chills.  You have redness, swelling, or pain around your port insertion site.  You have fluid or blood coming from your port insertion site.  Your port insertion site feels warm to the touch.  You have pus or a bad smell coming from the port insertion site. Get help right away if:  You have chest  pain or shortness of breath.  You have bleeding from your port that you cannot control. Summary  Take care of the port as told by your health care provider. Keep the manufacturer's information card with you at all times.  Change your dressing as told by your health care provider.  Contact a health care provider if you have a fever or chills or if you have redness, swelling, or pain around your port insertion site.  Keep all follow-up visits as told by your health care provider.  May start use of EMLA cream when port incision has healed.  This information is not intended  to replace advice given to you by your health care provider. Make sure you discuss any questions you have with your health care provider. Document Revised: 09/02/2017 Document Reviewed: 09/02/2017 Elsevier Patient Education  [place]. Moderate Conscious Sedation, Adult, Care After These instructions provide you with information about caring for yourself after your procedure. Your health care provider may also give you more specific instructions. Your treatment has been planned according to current medical practices, but problems sometimes occur. Call your health care provider if you have any problems or questions after your procedure. What can I expect after the procedure? After your procedure, it is common:  To feel sleepy for several hours.  To feel clumsy and have poor balance for several hours.  To have poor judgment for several hours.  To vomit if you eat too soon. Follow these instructions at home: For at least 24 hours after the procedure:  Do not: ? Participate in activities where you could fall or become injured. ? Drive. ? Use heavy machinery. ? Drink alcohol. ? Take sleeping pills or medicines that cause drowsiness. ? Make important decisions or sign legal documents. ? Take care of children on your own.  Rest. Eating and drinking  Follow the diet recommended by your health care provider.  If you vomit: ? Drink water, juice, or soup when you can drink without vomiting. ? Make sure you have little or no nausea before eating solid foods. General instructions  Have a responsible adult stay with you until you are awake and alert.  Take over-the-counter and prescription medicines only as told by your health care provider.  If you smoke, do not smoke without supervision.  Keep all follow-up visits as told by your health care provider. This is important. Contact a health care provider if:  You keep feeling nauseous or you keep vomiting.  You feel  light-headed.  You develop a rash.  You have a fever. Get help right away if:  You have trouble breathing. This information is not intended to replace advice given to you by your health care provider. Make sure you discuss any questions you have with your health care provider. Document Revised: 01/17/2017 Document Reviewed: 05/27/2015 Elsevier Patient Education  2020 [employer].
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
## 2019-02-18 NOTE — Procedures (Signed)
Interventional Radiology Procedure: ° ° °Indications: Metastatic lung cancer ° °Procedure: Port placement ° °Findings: Right jugular port, tip at SVC/RA junction ° °Complications: None °    °EBL: Minimal, less than 10 ml ° °Plan: Discharge in one hour.  Keep port site and incisions dry for at least 24 hours.   ° ° °Jazlin Tapscott R. Hadleigh Felber, MD  °Pager: [phone] ° °  °

## 2019-02-22 ENCOUNTER — Inpatient Hospital Stay: Payer: Medicaid Other | Attending: Hematology and Oncology

## 2019-02-22 ENCOUNTER — Other Ambulatory Visit: Payer: Self-pay

## 2019-02-22 ENCOUNTER — Telehealth: Payer: Self-pay | Admitting: *Deleted

## 2019-02-22 ENCOUNTER — Ambulatory Visit
Admission: RE | Admit: 2019-02-22 | Discharge: 2019-02-22 | Disposition: A | Discharge status: Home / Self Care | Payer: Medicaid Other | Source: Ambulatory Visit | Attending: Radiation Oncology | Admitting: Radiation Oncology

## 2019-02-22 ENCOUNTER — Inpatient Hospital Stay: Payer: Medicaid Other

## 2019-02-22 ENCOUNTER — Inpatient Hospital Stay (HOSPITAL_BASED_OUTPATIENT_CLINIC_OR_DEPARTMENT_OTHER): Payer: Medicaid Other | Admitting: Hematology and Oncology

## 2019-02-22 ENCOUNTER — Ambulatory Visit: Payer: Self-pay | Admitting: Nutrition

## 2019-02-22 VITALS — HR 105

## 2019-02-22 VITALS — BP 114/85 | HR 117 | Temp 98.3°F | Resp 16 | Ht 72.0 in | Wt 163.2 lb

## 2019-02-22 DIAGNOSIS — R222 Localized swelling, mass and lump, trunk: Secondary | ICD-10-CM | POA: Insufficient documentation

## 2019-02-22 DIAGNOSIS — C7931 Secondary malignant neoplasm of brain: Secondary | ICD-10-CM | POA: Insufficient documentation

## 2019-02-22 DIAGNOSIS — R05 Cough: Secondary | ICD-10-CM | POA: Diagnosis not present

## 2019-02-22 DIAGNOSIS — F17210 Nicotine dependence, cigarettes, uncomplicated: Secondary | ICD-10-CM | POA: Diagnosis not present

## 2019-02-22 DIAGNOSIS — R197 Diarrhea, unspecified: Secondary | ICD-10-CM | POA: Diagnosis not present

## 2019-02-22 DIAGNOSIS — Z7289 Other problems related to lifestyle: Secondary | ICD-10-CM | POA: Diagnosis not present

## 2019-02-22 DIAGNOSIS — K209 Esophagitis, unspecified without bleeding: Secondary | ICD-10-CM | POA: Diagnosis not present

## 2019-02-22 DIAGNOSIS — G629 Polyneuropathy, unspecified: Secondary | ICD-10-CM | POA: Insufficient documentation

## 2019-02-22 DIAGNOSIS — C3492 Malignant neoplasm of unspecified part of left bronchus or lung: Secondary | ICD-10-CM | POA: Diagnosis not present

## 2019-02-22 DIAGNOSIS — C3490 Malignant neoplasm of unspecified part of unspecified bronchus or lung: Secondary | ICD-10-CM

## 2019-02-22 DIAGNOSIS — R2681 Unsteadiness on feet: Secondary | ICD-10-CM | POA: Insufficient documentation

## 2019-02-22 DIAGNOSIS — R634 Abnormal weight loss: Secondary | ICD-10-CM | POA: Diagnosis not present

## 2019-02-22 DIAGNOSIS — R110 Nausea: Secondary | ICD-10-CM | POA: Diagnosis not present

## 2019-02-22 DIAGNOSIS — Z8249 Family history of ischemic heart disease and other diseases of the circulatory system: Secondary | ICD-10-CM | POA: Diagnosis not present

## 2019-02-22 DIAGNOSIS — Z809 Family history of malignant neoplasm, unspecified: Secondary | ICD-10-CM | POA: Insufficient documentation

## 2019-02-22 DIAGNOSIS — Z5111 Encounter for antineoplastic chemotherapy: Secondary | ICD-10-CM | POA: Insufficient documentation

## 2019-02-22 DIAGNOSIS — K625 Hemorrhage of anus and rectum: Secondary | ICD-10-CM | POA: Diagnosis not present

## 2019-02-22 DIAGNOSIS — Z833 Family history of diabetes mellitus: Secondary | ICD-10-CM | POA: Insufficient documentation

## 2019-02-22 DIAGNOSIS — R519 Headache, unspecified: Secondary | ICD-10-CM | POA: Diagnosis not present

## 2019-02-22 DIAGNOSIS — R112 Nausea with vomiting, unspecified: Secondary | ICD-10-CM | POA: Insufficient documentation

## 2019-02-22 DIAGNOSIS — Z510 Encounter for antineoplastic radiation therapy: Secondary | ICD-10-CM | POA: Diagnosis present

## 2019-02-22 DIAGNOSIS — C3412 Malignant neoplasm of upper lobe, left bronchus or lung: Secondary | ICD-10-CM | POA: Diagnosis present

## 2019-02-22 DIAGNOSIS — F1490 Cocaine use, unspecified, uncomplicated: Secondary | ICD-10-CM | POA: Diagnosis not present

## 2019-02-22 DIAGNOSIS — F1290 Cannabis use, unspecified, uncomplicated: Secondary | ICD-10-CM | POA: Diagnosis not present

## 2019-02-22 DIAGNOSIS — Z79899 Other long term (current) drug therapy: Secondary | ICD-10-CM | POA: Insufficient documentation

## 2019-02-22 DIAGNOSIS — R000 Tachycardia, unspecified: Secondary | ICD-10-CM

## 2019-02-22 LAB — CBC WITH DIFFERENTIAL (CANCER CENTER ONLY)
Abs Immature Granulocytes: 0.08 10*3/uL — ABNORMAL HIGH (ref 0.00–0.07)
Basophils Absolute: 0 10*3/uL (ref 0.0–0.1)
Basophils Relative: 0 %
Eosinophils Absolute: 0 10*3/uL (ref 0.0–0.5)
Eosinophils Relative: 0 %
HCT: 36.2 % — ABNORMAL LOW (ref 39.0–52.0)
Hemoglobin: 12.1 g/dL — ABNORMAL LOW (ref 13.0–17.0)
Immature Granulocytes: 1 %
Lymphocytes Relative: 10 %
Lymphs Abs: 0.7 10*3/uL (ref 0.7–4.0)
MCH: 28 pg (ref 26.0–34.0)
MCHC: 33.4 g/dL (ref 30.0–36.0)
MCV: 83.8 fL (ref 80.0–100.0)
Monocytes Absolute: 0.5 10*3/uL (ref 0.1–1.0)
Monocytes Relative: 6 %
Neutro Abs: 5.7 10*3/uL (ref 1.7–7.7)
Neutrophils Relative %: 83 %
Platelet Count: 340 10*3/uL (ref 150–400)
RBC: 4.32 MIL/uL (ref 4.22–5.81)
RDW: 17.1 % — ABNORMAL HIGH (ref 11.5–15.5)
WBC Count: 7 10*3/uL (ref 4.0–10.5)
nRBC: 0 % (ref 0.0–0.2)

## 2019-02-22 LAB — CMP (CANCER CENTER ONLY)
ALT: 42 U/L (ref 0–44)
AST: 38 U/L (ref 15–41)
Albumin: 2.5 g/dL — ABNORMAL LOW (ref 3.5–5.0)
Alkaline Phosphatase: 88 U/L (ref 38–126)
Anion gap: 12 (ref 5–15)
BUN: 11 mg/dL (ref 6–20)
CO2: 23 mmol/L (ref 22–32)
Calcium: 8.9 mg/dL (ref 8.9–10.3)
Chloride: 97 mmol/L — ABNORMAL LOW (ref 98–111)
Creatinine: 1.04 mg/dL (ref 0.61–1.24)
GFR, Est AFR Am: >60 mL/min (ref >60)
GFR, Estimated: >60 mL/min (ref >60)
Glucose, Bld: 115 mg/dL — ABNORMAL HIGH (ref 70–99)
Potassium: 3.6 mmol/L (ref 3.5–5.1)
Sodium: 132 mmol/L — ABNORMAL LOW (ref 135–145)
Total Bilirubin: 0.6 mg/dL (ref 0.3–1.2)
Total Protein: 7.2 g/dL (ref 6.5–8.1)

## 2019-02-22 MED ORDER — DIPHENHYDRAMINE HCL 50 MG/ML IJ SOLN
50.0000 mg | Freq: Once | INTRAMUSCULAR | Status: AC
  Administered 2019-02-22: 15:00:00 50 mg via INTRAVENOUS

## 2019-02-22 MED ORDER — FAMOTIDINE IN NACL 20-0.9 MG/50ML-% IV SOLN
20.0000 mg | Freq: Once | INTRAVENOUS | Status: AC
  Administered 2019-02-22: 15:00:00 20 mg via INTRAVENOUS

## 2019-02-22 MED ORDER — HEPARIN SOD (PORK) LOCK FLUSH 100 UNIT/ML IV SOLN
500.0000 [IU] | Freq: Once | INTRAVENOUS | Status: AC | PRN
  Administered 2019-02-22: 18:00:00 500 [IU]
  Filled 2019-02-22: qty 5

## 2019-02-22 MED ORDER — SODIUM CHLORIDE 0.9 % IV SOLN
45.0000 mg/m2 | Freq: Once | INTRAVENOUS | Status: AC
  Administered 2019-02-22: 16:00:00 90 mg via INTRAVENOUS
  Filled 2019-02-22: qty 15

## 2019-02-22 MED ORDER — SODIUM CHLORIDE 0.9 % IV SOLN
INTRAVENOUS | Status: AC
  Filled 2019-02-22 (×2): qty 250

## 2019-02-22 MED ORDER — LOPERAMIDE HCL 2 MG PO CAPS: 2.0000 mg | ORAL_CAPSULE | ORAL | 3 refills | Status: DC | PRN

## 2019-02-22 MED ORDER — SODIUM CHLORIDE 0.9 % IV SOLN
Freq: Once | INTRAVENOUS | Status: DC
  Filled 2019-02-22: qty 250

## 2019-02-22 MED ORDER — SODIUM CHLORIDE 0.9% FLUSH
10.0000 mL | INTRAVENOUS | Status: DC | PRN
  Administered 2019-02-22: 18:00:00 10 mL
  Filled 2019-02-22: qty 10

## 2019-02-22 MED ORDER — PALONOSETRON HCL INJECTION 0.25 MG/5ML
INTRAVENOUS | Status: AC
  Filled 2019-02-22: qty 5

## 2019-02-22 MED ORDER — PALONOSETRON HCL INJECTION 0.25 MG/5ML
0.2500 mg | Freq: Once | INTRAVENOUS | Status: AC
  Administered 2019-02-22: 0.25 mg via INTRAVENOUS

## 2019-02-22 MED ORDER — DEXAMETHASONE SODIUM PHOSPHATE 10 MG/ML IJ SOLN
INTRAMUSCULAR | Status: AC
  Filled 2019-02-22: qty 1

## 2019-02-22 MED ORDER — FAMOTIDINE IN NACL 20-0.9 MG/50ML-% IV SOLN
INTRAVENOUS | Status: AC
  Filled 2019-02-22: qty 50

## 2019-02-22 MED ORDER — DEXAMETHASONE SODIUM PHOSPHATE 10 MG/ML IJ SOLN
10.0000 mg | Freq: Once | INTRAMUSCULAR | Status: AC
  Administered 2019-02-22: 15:00:00 10 mg via INTRAVENOUS

## 2019-02-22 MED ORDER — DIPHENHYDRAMINE HCL 50 MG/ML IJ SOLN
INTRAMUSCULAR | Status: AC
  Filled 2019-02-22: qty 1

## 2019-02-22 MED ORDER — SODIUM CHLORIDE 0.9 % IV SOLN
217.2000 mg | Freq: Once | INTRAVENOUS | Status: AC
  Administered 2019-02-22: 17:00:00 220 mg via INTRAVENOUS
  Filled 2019-02-22: qty 22

## 2019-02-22 MED FILL — LOPERAMIDE 2 MG CAPSULE: 2 | 2 days supply | Qty: 30 | Fill #0

## 2019-02-22 NOTE — Telephone Encounter (Signed)
TCT patient x 2 as he had not shown up for his scheduled appts.  He answered on the 2nd call. He states he is just arriving to radiation oncology for his treatment (1 hour late), then he will get labs done, see Dr. Dr. Lorenso Courier and then go to infusion. Charge nurse in infusion is aware of his late arrival.

## 2019-02-22 NOTE — Progress Notes (Signed)
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 co-infections with other pathogens, and should not be used as the sole basis for treatment or other patient management decisions. Negative results must be combined with clinical observations, patient history, and epidemiological information. The expected result is Negative. Fact Sheet for  Patients: SugarRoll.be Fact Sheet for Healthcare Providers: https://www.woods-mathews.com/ This test is not yet approved or cleared by the [place] FDA and  has been authorized for detection and/or diagnosis of SARS-CoV-2 by FDA under an Emergency Use Authorization (EUA). This EUA will remain  in effect (meaning this test can be used) for the duration of the COVID-19 declaration under Section 56 4(b)(1) of the Act, 21 U.S.C. section 360bbb-3(b)(1), unless the authorization is terminated or revoked sooner. Performed at [facility], [place] [address], [place], [place] [address]   CBC with Differential/Platelet     Status: Abnormal   Collection Time: 01/18/19  4:06 AM  Result Value Ref Range   WBC 17.9 (H) 4.0 - 10.5 K/uL   RBC 4.44 4.22 - 5.81 MIL/uL   Hemoglobin 12.4 (L) 13.0 - 17.0 g/dL   HCT 37.4 (L) 39.0 - 52.0 %   MCV 84.2 80.0 - 100.0 fL   MCH 27.9 26.0 - 34.0 pg   MCHC 33.2 30.0 - 36.0 g/dL   RDW 13.5 11.5 - 15.5 %   Platelets 568 (H) 150 - 400 K/uL   nRBC 0.1 0.0 - 0.2 %   Neutrophils Relative % 75 %   Neutro Abs 13.6 (H) 1.7 - 7.7 K/uL   Lymphocytes Relative 15 %   Lymphs Abs 2.7 0.7 - 4.0 K/uL   Monocytes Relative 7 %   Monocytes Absolute 1.2 (H) 0.1 - 1.0 K/uL   Eosinophils Relative 0 %   Eosinophils Absolute 0.0 0.0 - 0.5 K/uL   Basophils Relative 0 %   Basophils Absolute 0.0 0.0 - 0.1 K/uL   Immature Granulocytes 3 %   Abs Immature Granulocytes 0.44 (H) 0.00 - 0.07 K/uL    Comment: Performed at [facility], [address], [place], [place] [address]  Comprehensive metabolic panel     Status: Abnormal   Collection Time: 01/18/19  4:06 AM  Result Value Ref Range   Sodium 134 (L) 135 - 145 mmol/L   Potassium 4.2 3.5 - 5.1 mmol/L   Chloride 97 (L) 98 - 111 mmol/L   CO2 24 22 - 32 mmol/L   Glucose, Bld 135 (H) 70 - 99 mg/dL   BUN 15 6 - 20 mg/dL   Creatinine, Ser 0.82 0.61 - 1.24 mg/dL   Calcium 8.9 8.9 -  10.3 mg/dL   Total Protein 6.5 6.5 - 8.1 g/dL   Albumin 2.4 (L) 3.5 - 5.0 g/dL   AST 34 15 - 41 U/L   ALT 99 (H) 0 - 44 U/L   Alkaline Phosphatase 78 38 - 126 U/L   Total Bilirubin 0.2 (L) 0.3 - 1.2 mg/dL   GFR calc non Af Amer >60 >60 mL/min   GFR calc Af Amer >60 >60 mL/min   Anion gap 13 5 - 15    Comment: Performed at [facility], [place] [address], [place], [place] [address]  Glucose, capillary     Status: Abnormal  Collection Time: 01/18/19  6:28 AM  Result Value Ref Range   Glucose-Capillary 112 (H) 70 - 99 mg/dL  Glucose, capillary     Status: Abnormal   Collection Time: 01/19/19  6:08 AM  Result Value Ref Range   Glucose-Capillary 130 (H) 70 - 99 mg/dL   Comment 1 Notify RN    Comment 2 Document in Chart   Glucose, capillary     Status: Abnormal   Collection Time: 01/20/19  6:05 AM  Result Value Ref Range   Glucose-Capillary 138 (H) 70 - 99 mg/dL  CBC     Status: Abnormal   Collection Time: 01/21/19  3:41 AM  Result Value Ref Range   WBC 22.2 (H) 4.0 - 10.5 K/uL   RBC 4.30 4.22 - 5.81 MIL/uL   Hemoglobin 12.2 (L) 13.0 - 17.0 g/dL   HCT 36.9 (L) 39.0 - 52.0 %   MCV 85.8 80.0 - 100.0 fL   MCH 28.4 26.0 - 34.0 pg   MCHC 33.1 30.0 - 36.0 g/dL   RDW 14.6 11.5 - 15.5 %   Platelets 515 (H) 150 - 400 K/uL   nRBC 0.1 0.0 - 0.2 %    Comment: Performed at [facility], [address], [place], [place] [address]  Comprehensive metabolic panel     Status: Abnormal   Collection Time: 01/21/19  3:41 AM  Result Value Ref Range   Sodium 136 135 - 145 mmol/L   Potassium 4.1 3.5 - 5.1 mmol/L   Chloride 98 98 - 111 mmol/L   CO2 24 22 - 32 mmol/L   Glucose, Bld 162 (H) 70 - 99 mg/dL   BUN 13 6 - 20 mg/dL   Creatinine, Ser 0.81 0.61 - 1.24 mg/dL   Calcium 8.6 (L) 8.9 - 10.3 mg/dL   Total Protein 5.8 (L) 6.5 - 8.1 g/dL   Albumin 2.4 (L) 3.5 - 5.0 g/dL   AST 33 15 - 41 U/L   ALT 91 (H) 0 - 44 U/L   Alkaline Phosphatase 87 38 - 126 U/L   Total Bilirubin 0.5 0.3 - 1.2  mg/dL   GFR calc non Af Amer >60 >60 mL/min   GFR calc Af Amer >60 >60 mL/min   Anion gap 14 5 - 15    Comment: Performed at [place] [address], [place], [place] [address]  Glucose, capillary     Status: Abnormal   Collection Time: 01/21/19  5:56 AM  Result Value Ref Range   Glucose-Capillary 115 (H) 70 - 99 mg/dL  Glucose, capillary     Status: Abnormal   Collection Time: 01/29/19  7:15 AM  Result Value Ref Range   Glucose-Capillary 103 (H) 70 - 99 mg/dL  CMP ([facility] only)     Status: Abnormal   Collection Time: 02/05/19  2:22 PM  Result Value Ref Range   Sodium 136 135 - 145 mmol/L   Potassium 4.4 3.5 - 5.1 mmol/L   Chloride 101 98 - 111 mmol/L   CO2 23 22 - 32 mmol/L   Glucose, Bld 151 (H) 70 - 99 mg/dL   BUN 16 6 - 20 mg/dL   Creatinine 0.94 0.61 - 1.24 mg/dL   Calcium 8.5 (L) 8.9 - 10.3 mg/dL   Total Protein 6.7 6.5 - 8.1 g/dL   Albumin 2.8 (L) 3.5 - 5.0 g/dL   AST 15 15 - 41 U/L   ALT 36 0 - 44 U/L   Alkaline Phosphatase  85 38 - 126 U/L   Total Bilirubin 0.6 0.3 - 1.2 mg/dL   GFR, Est Non Af Am >60 >60 mL/min   GFR, Est AFR Am >60 >60 mL/min   Anion gap 12 5 - 15    Comment: Performed at [facility], [place] [address], [place], [place] [address]  CBC with Differential ([place] Only)     Status: Abnormal   Collection Time: 02/05/19  2:22 PM  Result Value Ref Range   WBC Count 11.1 (H) 4.0 - 10.5 K/uL   RBC 4.56 4.22 - 5.81 MIL/uL   Hemoglobin 12.6 (L) 13.0 - 17.0 g/dL   HCT 38.2 (L) 39.0 - 52.0 %   MCV 83.8 80.0 - 100.0 fL   MCH 27.6 26.0 - 34.0 pg   MCHC 33.0 30.0 - 36.0 g/dL   RDW 17.3 (H) 11.5 - 15.5 %   Platelet Count 255 150 - 400 K/uL   nRBC 0.4 (H) 0.0 - 0.2 %   Neutrophils Relative % 77 %   Neutro Abs 8.6 (H) 1.7 - 7.7 K/uL   Lymphocytes Relative 12 %   Lymphs Abs 1.3 0.7 - 4.0 K/uL   Monocytes Relative 9 %   Monocytes Absolute 1.0 0.1 - 1.0 K/uL   Eosinophils Relative 0 %   Eosinophils Absolute 0.0  0.0 - 0.5 K/uL   Basophils Relative 0 %   Basophils Absolute 0.0 0.0 - 0.1 K/uL   Immature Granulocytes 2 %   Abs Immature Granulocytes 0.18 (H) 0.00 - 0.07 K/uL    Comment: Performed at [facility], [address], [place], [place] [address]  CBC with Differential     Status: Abnormal   Collection Time: 02/13/19  8:45 PM  Result Value Ref Range   WBC 11.6 (H) 4.0 - 10.5 K/uL   RBC 4.80 4.22 - 5.81 MIL/uL   Hemoglobin 13.5 13.0 - 17.0 g/dL   HCT 41.3 39.0 - 52.0 %   MCV 86.0 80.0 - 100.0 fL   MCH 28.1 26.0 - 34.0 pg   MCHC 32.7 30.0 - 36.0 g/dL   RDW 17.8 (H) 11.5 - 15.5 %   Platelets 367 150 - 400 K/uL   nRBC 0.2 0.0 - 0.2 %   Neutrophils Relative % 84 %   Neutro Abs 9.8 (H) 1.7 - 7.7 K/uL   Lymphocytes Relative 7 %   Lymphs Abs 0.8 0.7 - 4.0 K/uL   Monocytes Relative 7 %   Monocytes Absolute 0.8 0.1 - 1.0 K/uL   Eosinophils Relative 0 %   Eosinophils Absolute 0.0 0.0 - 0.5 K/uL   Basophils Relative 0 %   Basophils Absolute 0.1 0.0 - 0.1 K/uL   Immature Granulocytes 2 %   Abs Immature Granulocytes 0.27 (H) 0.00 - 0.07 K/uL    Comment: Performed at [place], [place] [address], [place],  [address]  Basic metabolic panel     Status: Abnormal   Collection Time: 02/13/19  8:45 PM  Result Value Ref Range   Sodium 127 (L) 135 - 145 mmol/L   Potassium 4.2 3.5 - 5.1 mmol/L   Chloride 94 (L) 98 - 111 mmol/L   CO2 21 (L) 22 - 32 mmol/L   Glucose, Bld 131 (H) 70 - 99 mg/dL   BUN 19 6 - 20 mg/dL   Creatinine, Ser 0.83 0.61 - 1.24 mg/dL   Calcium 8.6 (L) 8.9 - 10.3 mg/dL   GFR calc non  Af Amer >60 >60 mL/min   GFR calc Af Amer >60 >60 mL/min   Anion gap 12 5 - 15    Comment: Performed at [facility], [place] [address] [place], [place] [address]  POC SARS Coronavirus 2 Ag-ED - Nasal Swab (BD Veritor Kit)     Status: None   Collection Time: 02/13/19 10:32 PM  Result Value Ref Range   SARS Coronavirus 2 Ag  NEGATIVE NEGATIVE    Comment: (NOTE) SARS-CoV-2 antigen NOT DETECTED.  Negative results are presumptive.  Negative results do not preclude SARS-CoV-2 infection and should not be used as the sole basis for treatment or other patient management decisions, including infection  control decisions, particularly in the presence of clinical signs and  symptoms consistent with COVID-19, or in those who have been in contact with the virus.  Negative results must be combined with clinical observations, patient history, and epidemiological information. The expected result is Negative. Fact Sheet for Patients: PodPark.tn Fact Sheet for Healthcare Providers: GiftContent.is This test is not yet approved or cleared by the [place] FDA and  has been authorized for detection and/or diagnosis of SARS-CoV-2 by FDA under an Emergency Use Authorization (EUA).  This EUA will remain in effect (meaning this test can be used) for the duration of  the COVID-19 de claration under Section 564(b)(1) of the Act, 21 U.S.C. section 360bbb-3(b)(1), unless the authorization is terminated or revoked sooner.   Novel Coronavirus, NAA (Hosp order, Send-out to Ref Lab; TAT 18-24 hrs     Status: None   Collection Time: 02/13/19 10:34 PM   Specimen: Nasopharyngeal Swab; Respiratory  Result Value Ref Range   SARS-CoV-2, NAA NOT DETECTED NOT DETECTED    Comment: (NOTE) This nucleic acid amplification test was developed and its performance characteristics determined by [employer]. Nucleic acid amplification tests include PCR and TMA. This test has not been FDA cleared or approved. This test has been authorized by FDA under an Emergency Use Authorization (EUA). This test is only authorized for the duration of time the declaration that circumstances exist justifying the authorization of the emergency use of in vitro diagnostic tests for detection of  SARS-CoV-2 virus and/or diagnosis of COVID-19 infection under section 564(b)(1) of the Act, 21 U.S.C. 629UTM-5(Y) (1), unless the authorization is terminated or revoked sooner. When diagnostic testing is negative, the possibility of a false negative result should be considered in the context of a patient's recent exposures and the presence of clinical signs and symptoms consistent with COVID-19. An individual without symptoms of COVID- 19 and who is not shedding SARS-CoV-2 vi rus would expect to have a negative (not detected) result in this assay. Performed At: [facility] [place], [place] [id number] Rush Farmer MD CL:[id number]    Coronavirus Source NASOPHARYNGEAL     Comment: Performed at [place] [address], [place],  [address]  CBC with Differential ([place] Only)     Status: Abnormal   Collection Time: 02/15/19  8:35 AM  Result Value Ref Range   WBC Count 8.4 4.0 - 10.5 K/uL   RBC 4.30 4.22 - 5.81 MIL/uL   Hemoglobin 11.8 (L) 13.0 - 17.0 g/dL   HCT 35.9 (L) 39.0 - 52.0 %   MCV 83.5 80.0 - 100.0 fL   MCH 27.4 26.0 - 34.0 pg   MCHC 32.9 30.0 - 36.0 g/dL   RDW 17.1 (H) 11.5 - 15.5 %   Platelet Count 379 150 -  400 K/uL   nRBC 0.0 0.0 - 0.2 %   Neutrophils Relative % 80 %   Neutro Abs 6.6 1.7 - 7.7 K/uL   Lymphocytes Relative 11 %   Lymphs Abs 1.0 0.7 - 4.0 K/uL   Monocytes Relative 8 %   Monocytes Absolute 0.7 0.1 - 1.0 K/uL   Eosinophils Relative 0 %   Eosinophils Absolute 0.0 0.0 - 0.5 K/uL   Basophils Relative 0 %   Basophils Absolute 0.0 0.0 - 0.1 K/uL   Immature Granulocytes 1 %   Abs Immature Granulocytes 0.12 (H) 0.00 - 0.07 K/uL    Comment: Performed at [facility], [place] [address] [place], [place] [address]  CMP ([place] only)     Status: Abnormal   Collection Time: 02/15/19  8:35 AM  Result Value Ref Range   Sodium 131 (L) 135 - 145 mmol/L   Potassium 3.9 3.5 -  5.1 mmol/L   Chloride 99 98 - 111 mmol/L   CO2 20 (L) 22 - 32 mmol/L   Glucose, Bld 99 70 - 99 mg/dL   BUN 16 6 - 20 mg/dL   Creatinine 0.82 0.61 - 1.24 mg/dL   Calcium 8.8 (L) 8.9 - 10.3 mg/dL   Total Protein 7.0 6.5 - 8.1 g/dL   Albumin 2.5 (L) 3.5 - 5.0 g/dL   AST 27 15 - 41 U/L   ALT 60 (H) 0 - 44 U/L   Alkaline Phosphatase 98 38 - 126 U/L   Total Bilirubin 0.9 0.3 - 1.2 mg/dL   GFR, Est Non Af Am >60 >60 mL/min   GFR, Est AFR Am >60 >60 mL/min   Anion gap 12 5 - 15    Comment: Performed at [facility], [address], [place], [place] [address]  CBC with Differential     Status: Abnormal   Collection Time: 02/18/19  1:36 PM  Result Value Ref Range   WBC 6.6 4.0 - 10.5 K/uL   RBC 4.58 4.22 - 5.81 MIL/uL   Hemoglobin 12.9 (L) 13.0 - 17.0 g/dL   HCT 39.1 39.0 - 52.0 %   MCV 85.4 80.0 - 100.0 fL   MCH 28.2 26.0 - 34.0 pg   MCHC 33.0 30.0 - 36.0 g/dL   RDW 17.5 (H) 11.5 - 15.5 %   Platelets 452 (H) 150 - 400 K/uL   nRBC 0.9 (H) 0.0 - 0.2 %   Neutrophils Relative % 81 %   Neutro Abs 5.3 1.7 - 7.7 K/uL   Lymphocytes Relative 13 %   Lymphs Abs 0.8 0.7 - 4.0 K/uL   Monocytes Relative 4 %   Monocytes Absolute 0.2 0.1 - 1.0 K/uL   Eosinophils Relative 0 %   Eosinophils Absolute 0.0 0.0 - 0.5 K/uL   Basophils Relative 0 %   Basophils Absolute 0.0 0.0 - 0.1 K/uL   Immature Granulocytes 2 %   Abs Immature Granulocytes 0.12 (H) 0.00 - 0.07 K/uL    Comment: Performed at [facility], [place] [address] [place], [place] [address]  Protime-INR     Status: None   Collection Time: 02/18/19  1:36 PM  Result Value Ref Range   Prothrombin Time 14.6 11.4 - 15.2 seconds   INR 1.2 0.8 - 1.2    Comment: (NOTE) INR goal varies based on device and disease states. Performed at [facility], [place] [address], [place], [place] [address]   CMP ([place] only)  Status: Abnormal   Collection Time: 02/22/19 12:29 PM  Result  Value Ref Range   Sodium 132 (L) 135 - 145 mmol/L   Potassium 3.6 3.5 - 5.1 mmol/L   Chloride 97 (L) 98 - 111 mmol/L   CO2 23 22 - 32 mmol/L   Glucose, Bld 115 (H) 70 - 99 mg/dL   BUN 11 6 - 20 mg/dL   Creatinine 1.04 0.61 - 1.24 mg/dL   Calcium 8.9 8.9 - 10.3 mg/dL   Total Protein 7.2 6.5 - 8.1 g/dL   Albumin 2.5 (L) 3.5 - 5.0 g/dL   AST 38 15 - 41 U/L   ALT 42 0 - 44 U/L   Alkaline Phosphatase 88 38 - 126 U/L   Total Bilirubin 0.6 0.3 - 1.2 mg/dL   GFR, Est Non Af Am >60 >60 mL/min   GFR, Est AFR Am >60 >60 mL/min   Anion gap 12 5 - 15    Comment: Performed at [facility], [place] [address] [place], [place] [address]  CBC with Differential ([place] Only)     Status: Abnormal   Collection Time: 02/22/19 12:29 PM  Result Value Ref Range   WBC Count 7.0 4.0 - 10.5 K/uL   RBC 4.32 4.22 - 5.81 MIL/uL   Hemoglobin 12.1 (L) 13.0 - 17.0 g/dL   HCT 36.2 (L) 39.0 - 52.0 %   MCV 83.8 80.0 - 100.0 fL   MCH 28.0 26.0 - 34.0 pg   MCHC 33.4 30.0 - 36.0 g/dL   RDW 17.1 (H) 11.5 - 15.5 %   Platelet Count 340 150 - 400 K/uL   nRBC 0.0 0.0 - 0.2 %   Neutrophils Relative % 83 %   Neutro Abs 5.7 1.7 - 7.7 K/uL   Lymphocytes Relative 10 %   Lymphs Abs 0.7 0.7 - 4.0 K/uL   Monocytes Relative 6 %   Monocytes Absolute 0.5 0.1 - 1.0 K/uL   Eosinophils Relative 0 %   Eosinophils Absolute 0.0 0.0 - 0.5 K/uL   Basophils Relative 0 %   Basophils Absolute 0.0 0.0 - 0.1 K/uL   Immature Granulocytes 1 %   Abs Immature Granulocytes 0.08 (H) 0.00 - 0.07 K/uL    Comment: Performed at [facility], [address], [place], [place] [address]     RADIOGRAPHIC STUDIES: I have personally reviewed the radiological images as listed and agreed with the findings in the report: no evidence of FDG avidity outside the lung.   NM PET Image Initial (PI) Skull Base To Thigh  Result Date: 01/29/2019 CLINICAL DATA:  Initial treatment strategy for lung mass. EXAM:  NUCLEAR MEDICINE PET SKULL BASE TO THIGH TECHNIQUE: 8.34 mCi F-18 FDG was injected intravenously. Full-ring PET imaging was performed from the skull base to thigh after the radiotracer. CT data was obtained and used for attenuation correction and anatomic localization. Fasting blood glucose: 103 mg/dl COMPARISON:  None. FINDINGS: Mediastinal blood pool activity: SUV max 1.51 Liver activity: SUV max NA NECK: No hypermetabolic lymph nodes in the neck. Incidental CT findings: none CHEST: Large rounded peripherally hypermetabolic mass is present in the LEFT upper lobe measuring 5.5 x 4.6 cm with SUV max equal 11.4. This mass is centrally cavitary/necrotic. Hypermetabolic LEFT hilar lymph node measuring 1.7 cm with SUV max equal 7.0. No additional hypermetabolic mediastinal lymph nodes. Diffuse activity through the esophagus is favored esophagitis. Incidental CT findings: none ABDOMEN/PELVIS: No abnormal hypermetabolic activity within the liver, pancreas, adrenal  glands, or spleen. No hypermetabolic lymph nodes in the abdomen or pelvis. Incidental CT findings: none SKELETON: No focal hypermetabolic activity to suggest skeletal metastasis. Incidental CT findings: none IMPRESSION: 1. Hypermetabolic cavitary mass in LEFT upper lobe consistent bronchogenic carcinoma. 2. Ipsilateral hypermetabolic nodal metastasis. 3. No evidence of mediastinal nodal metastasis or distant disease. 4. Diffuse metabolic activity through the esophagus is favored esophagitis. Electronically Signed   By: Suzy Bouchard M.D.   On: 01/29/2019 10:28   DG Chest Portable 1 View  Result Date: 02/13/2019 CLINICAL DATA:  Cough and fever EXAM: PORTABLE CHEST 1 VIEW COMPARISON:  Pet CT January 29, 2019 FINDINGS: The heart size and mediastinal contours are within normal limits. Again noted is a rounded cavitary mass within the left upper lobe now measuring 8.6 cm, appears slightly enlarged from the prior PET-CT. No new airspace consolidation or  pleural effusion is seen. No acute osseous abnormality IMPRESSION: Slight interval enlargement in the cavitary mass within the left upper lobe measuring 8.6 cm. Electronically Signed   By: Prudencio Pair M.D.   On: 02/13/2019 20:22   IR IMAGING GUIDED PORT INSERTION  Result Date: 02/18/2019 INDICATION: 59 year old with metastatic lung cancer. Port-A-Cath needed for treatment. EXAM: FLUOROSCOPIC AND ULTRASOUND GUIDED PLACEMENT OF A SUBCUTANEOUS PORT COMPARISON:  None. MEDICATIONS: Ancef 2 g; The antibiotic was administered within an appropriate time interval prior to skin puncture. ANESTHESIA/SEDATION: Versed 2.0 mg IV; Fentanyl 100 mcg IV; Moderate Sedation Time:  26 minutes The patient was continuously monitored during the procedure by the interventional radiology nurse under my direct supervision. FLUOROSCOPY TIME:  12 seconds, 2 mGy COMPLICATIONS: None immediate. PROCEDURE: The procedure, risks, benefits, and alternatives were explained to the patient. Questions regarding the procedure were encouraged and answered. The patient understands and consents to the procedure. Patient was placed supine on the interventional table. Ultrasound confirmed a patent right internal jugular vein. Ultrasound image was saved for documentation. The right chest and neck were cleaned with a skin antiseptic and a sterile drape was placed. Maximal barrier sterile technique was utilized including caps, mask, sterile gowns, sterile gloves, sterile drape, hand hygiene and skin antiseptic. The right neck was anesthetized with 1% lidocaine. Small incision was made in the right neck with a blade. Micropuncture set was placed in the right internal jugular vein with ultrasound guidance. The micropuncture wire was used for measurement purposes. The right chest was anesthetized with 1% lidocaine with epinephrine. #15 blade was used to make an incision and a subcutaneous port pocket was formed. [place] was assembled. Subcutaneous  tunnel was formed with a stiff tunneling device. The port catheter was brought through the subcutaneous tunnel. The port was placed in the subcutaneous pocket. The micropuncture set was exchanged for a peel-away sheath. The catheter was placed through the peel-away sheath and the tip was positioned at the SVC and right atrium junction. Catheter placement was confirmed with fluoroscopy. The port was accessed and flushed with heparinized saline. The port pocket was closed using two layers of absorbable sutures and Dermabond. The vein skin site was closed using a single layer of absorbable suture and Dermabond. Sterile dressings were applied. Patient tolerated the procedure well without an immediate complication. Ultrasound and fluoroscopic images were taken and saved for this procedure. IMPRESSION: Placement of a subcutaneous port device. Catheter tip at the SVC and right atrium junction. Electronically Signed   By: Markus Daft M.D.   On: 02/18/2019 17:32    ASSESSMENT & PLAN Micheal Vincent 58  y.o. male with medical history significant for metastatic non-small cell lung cancer who presents for a follow up visit on chemotherapy. He has completed 2 weeks of chemoradiation and is here to start week 3 today.   On discussion today Micheal Vincent is having issues with diarrhea as well as numbness of his feet.  Additionally his appetite is poor due to lack of flavor.  In order to address these we will prescribe him antidiarrheal medications continue to monitor his neuropathy and provide him with a consult to dietary.  His symptoms at this point time are mild/moderate and do not require dose adjustment for his chemotherapy.  Current plan is for continued definitive chemoradiation. Given that the cancer is poorly differentiated I favor a regimen that would be acceptable for both NSCLC subtypes. Plan to proceed with Carboplatin AUC 2 and Paclitaxel 9m/m2 on Day 1,8, 15 of 21 day cycle x 2 cycles with concurrent2 Gy fractions  x 30 fractions (total of 60 Gy). This would be followed withtwo cycles of consolidation consisting of paclitaxel (200 mg/m2) and carboplatin (AUC 6) (Lancet Oncol. 2015 Feb;16(2):187-99.).Finally, the patient would be placed on durvalumab maintenance therapy ([employer] CorningMed. 2017 Nov 16;377(20):1919-1929).  #MetastaticNon-Small Cell Lung Cancer, Poorly Differentiated Carcinoma. [id number] Stage IVa --OK to continue with definitive chemoradiation with carbo/paclitaxel at current chemotherapy dosage. Patient has received 2 sessions of chemotherapy, with an additional planned 5 doses including today. The details for this regimen are listed above. --currently pending tissue testing for PD-L1 status and additionally for Foundation One studies. These results will not alter the current plan but may be useful for treatment in the future --patient to continue following with Dr. MTammi Klippelfor the RT portion of his chemoradiation. --RTC the week of 03/15/2019 to assess how he is tolerating therapy with chemo/RT. Assure weekly labs in the interim with chemotherapy.   #Weight Loss, worsening --referral to dietary for consideration of Boost/Ensure or further recommendations --mirtazepine as above for sleep/appetite stimulation.   #Symptom Management --headache has improved, patient is not currently experiencing any pain --continue dexamethasone taper per rad/onc recommendations.  --will assure patient has an anti-nausea regimen prepared for when he starts therapy  --insominia: continue mirtazepine 7.5 mg daily   #Active Smoker --patient is a current active smoker,  interested in smoking cessation.  --provided with resources to help with quitting at last visit --continue to encourage smoking cessation.    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 The patient knows to call the clinic with any problems, questions or concerns.  A total of more than 30 minutes were spent on this encounter and over half of that time was spent on counseling and coordination of care as outlined above.   JLedell Peoples MD [facility] H[facility]ology[facility]Phone: [phone]: [phone]: [email or web address]  02/22/2019 4:34 PM

## 2019-02-22 NOTE — Progress Notes (Signed)
Okay to treat per Dr. Lorenso Courier w/ elevated heart rate - he will receive 1L normal saline in infusion today prior to treatment for dehydration and lack of PO intake.   Demetrius Charity, PharmD, [facility] Phone: [phone] 02/22/2019

## 2019-02-22 NOTE — Progress Notes (Signed)
59 year old male diagnosed with metastatic non-small cell lung cancer.  He has brain metastasis.  He is receiving chemoradiation therapy.  Past medical history includes septic arthritis.  Medications include Decadron, Pepcid, Diflucan, Folvite, Imodium, Remeron, Zofran, Compazine, and thiamine.  Labs include sodium 132, glucose 115, and albumin 2.5.  Height: 6 feet 0 inches. Weight: 163.2 pounds. Usual body weight: 175 pounds per patient. BMI: 22.13.  Noted patient's weight fluctuates up and down and was documented as 163 - 182 pounds throughout the month of December. Patient states his appetite comes and goes. He has reported some nausea and vomiting. Reports experiencing 2-3 very loose runny stools daily. He has not tried [employer] yet. Denies food insecurity.  Nutrition diagnosis:  Unintended weight loss related to metastatic cancer as evidenced by 7% weight loss from usual body weight.  Intervention: Nutrition focused physical exam was deferred as patient was getting started with his chemotherapy. Educated patient on the importance of small, frequent meals and snacks with increased calories and protein. Encourage patient to try vanilla Ensure and provided samples.  Patient enjoyed so I will provide him with 1 complementary case to take with him today. Reviewed strategies for eating with diarrhea. Encourage medications to improve nausea and vomiting. Fact sheets were provided.  Questions were answered.  Teach back method used.  Contact information given.  Monitoring, evaluation, goals: Patient will tolerate increased calories and protein to minimize weight loss.  Next visit: Monday, January 11 during infusion.  **Disclaimer: This note was dictated with voice recognition software. Similar sounding words can inadvertently be transcribed and this note may contain transcription errors which may not have been corrected upon publication of note.**

## 2019-02-22 NOTE — Patient Instructions (Signed)
Carboplatin injection What is this medicine? CARBOPLATIN (KAR boe pla tin) is a chemotherapy drug. It targets fast dividing cells, like cancer cells, and causes these cells to die. This medicine is used to treat ovarian cancer and many other cancers. This medicine may be used for other purposes; ask your health care provider or pharmacist if you have questions. COMMON BRAND NAME(S): Paraplatin What should I tell my health care provider before I take this medicine? They need to know if you have any of these conditions:  blood disorders  hearing problems  kidney disease  recent or ongoing radiation therapy  an unusual or allergic reaction to carboplatin, cisplatin, other chemotherapy, other medicines, foods, dyes, or preservatives  pregnant or trying to get pregnant  breast-feeding How should I use this medicine? This drug is usually given as an infusion into a vein. It is administered in a hospital or clinic by a specially trained health care professional. Talk to your pediatrician regarding the use of this medicine in children. Special care may be needed. Overdosage: If you think you have taken too much of this medicine contact a poison control center or emergency room at once. NOTE: This medicine is only for you. Do not share this medicine with others. What if I miss a dose? It is important not to miss a dose. Call your doctor or health care professional if you are unable to keep an appointment. What may interact with this medicine?  medicines for seizures  medicines to increase blood counts like filgrastim, pegfilgrastim, sargramostim  some antibiotics like amikacin, gentamicin, neomycin, streptomycin, tobramycin  vaccines Talk to your doctor or health care professional before taking any of these medicines:  acetaminophen  aspirin  ibuprofen  ketoprofen  naproxen This list may not describe all possible interactions. Give your health care provider a list of all the  medicines, herbs, non-prescription drugs, or dietary supplements you use. Also tell them if you smoke, drink alcohol, or use illegal drugs. Some items may interact with your medicine. What should I watch for while using this medicine? Your condition will be monitored carefully while you are receiving this medicine. You will need important blood work done while you are taking this medicine. This drug may make you feel generally unwell. This is not uncommon, as chemotherapy can affect healthy cells as well as cancer cells. Report any side effects. Continue your course of treatment even though you feel ill unless your doctor tells you to stop. In some cases, you may be given additional medicines to help with side effects. Follow all directions for their use. Call your doctor or health care professional for advice if you get a fever, chills or sore throat, or other symptoms of a cold or flu. Do not treat yourself. This drug decreases your body's ability to fight infections. Try to avoid being around people who are sick. This medicine may increase your risk to bruise or bleed. Call your doctor or health care professional if you notice any unusual bleeding. Be careful brushing and flossing your teeth or using a toothpick because you may get an infection or bleed more easily. If you have any dental work done, tell your dentist you are receiving this medicine. Avoid taking products that contain aspirin, acetaminophen, ibuprofen, naproxen, or ketoprofen unless instructed by your doctor. These medicines may hide a fever. Do not become pregnant while taking this medicine. Women should inform their doctor if they wish to become pregnant or think they might be pregnant. There is a  potential for serious side effects to an unborn child. Talk to your health care professional or pharmacist for more information. Do not breast-feed an infant while taking this medicine. What side effects may I notice from receiving this  medicine? Side effects that you should report to your doctor or health care professional as soon as possible:  allergic reactions like skin rash, itching or hives, swelling of the face, lips, or tongue  signs of infection - fever or chills, cough, sore throat, pain or difficulty passing urine  signs of decreased platelets or bleeding - bruising, pinpoint red spots on the skin, black, tarry stools, nosebleeds  signs of decreased red blood cells - unusually weak or tired, fainting spells, lightheadedness  breathing problems  changes in hearing  changes in vision  chest pain  high blood pressure  low blood counts - This drug may decrease the number of white blood cells, red blood cells and platelets. You may be at increased risk for infections and bleeding.  nausea and vomiting  pain, swelling, redness or irritation at the injection site  pain, tingling, numbness in the hands or feet  problems with balance, talking, walking  trouble passing urine or change in the amount of urine Side effects that usually do not require medical attention (report to your doctor or health care professional if they continue or are bothersome):  hair loss  loss of appetite  metallic taste in the mouth or changes in taste This list may not describe all possible side effects. Call your doctor for medical advice about side effects. You may report side effects to FDA at 1-800-FDA-1088. Where should I keep my medicine? This drug is given in a hospital or clinic and will not be stored at home. NOTE: This sheet is a summary. It may not cover all possible information. If you have questions about this medicine, talk to your doctor, pharmacist, or health care provider.  2020 Elsevier/Gold Standard (2007-05-12 14:38:05) Paclitaxel injection What is this medicine? PACLITAXEL (PAK li TAX el) is a chemotherapy drug. It targets fast dividing cells, like cancer cells, and causes these cells to die. This  medicine is used to treat ovarian cancer, breast cancer, lung cancer, Kaposi's sarcoma, and other cancers. This medicine may be used for other purposes; ask your health care provider or pharmacist if you have questions. COMMON BRAND NAME(S): Onxol, Taxol What should I tell my health care provider before I take this medicine? They need to know if you have any of these conditions:  history of irregular heartbeat  liver disease  low blood counts, like low white cell, platelet, or red cell counts  lung or breathing disease, like asthma  tingling of the fingers or toes, or other nerve disorder  an unusual or allergic reaction to paclitaxel, alcohol, polyoxyethylated castor oil, other chemotherapy, other medicines, foods, dyes, or preservatives  pregnant or trying to get pregnant  breast-feeding How should I use this medicine? This drug is given as an infusion into a vein. It is administered in a hospital or clinic by a specially trained health care professional. Talk to your pediatrician regarding the use of this medicine in children. Special care may be needed. Overdosage: If you think you have taken too much of this medicine contact a poison control center or emergency room at once. NOTE: This medicine is only for you. Do not share this medicine with others. What if I miss a dose? It is important not to miss your dose. Call your doctor or  health care professional if you are unable to keep an appointment. What may interact with this medicine? Do not take this medicine with any of the following medications:  disulfiram  metronidazole This medicine may also interact with the following medications:  antiviral medicines for hepatitis, HIV or AIDS  certain antibiotics like erythromycin and clarithromycin  certain medicines for fungal infections like ketoconazole and itraconazole  certain medicines for seizures like carbamazepine, phenobarbital,  phenytoin  gemfibrozil  nefazodone  rifampin  St. John's wort This list may not describe all possible interactions. Give your health care provider a list of all the medicines, herbs, non-prescription drugs, or dietary supplements you use. Also tell them if you smoke, drink alcohol, or use illegal drugs. Some items may interact with your medicine. What should I watch for while using this medicine? Your condition will be monitored carefully while you are receiving this medicine. You will need important blood work done while you are taking this medicine. This medicine can cause serious allergic reactions. To reduce your risk you will need to take other medicine(s) before treatment with this medicine. If you experience allergic reactions like skin rash, itching or hives, swelling of the face, lips, or tongue, tell your doctor or health care professional right away. In some cases, you may be given additional medicines to help with side effects. Follow all directions for their use. This drug may make you feel generally unwell. This is not uncommon, as chemotherapy can affect healthy cells as well as cancer cells. Report any side effects. Continue your course of treatment even though you feel ill unless your doctor tells you to stop. Call your doctor or health care professional for advice if you get a fever, chills or sore throat, or other symptoms of a cold or flu. Do not treat yourself. This drug decreases your body's ability to fight infections. Try to avoid being around people who are sick. This medicine may increase your risk to bruise or bleed. Call your doctor or health care professional if you notice any unusual bleeding. Be careful brushing and flossing your teeth or using a toothpick because you may get an infection or bleed more easily. If you have any dental work done, tell your dentist you are receiving this medicine. Avoid taking products that contain aspirin, acetaminophen, ibuprofen,  naproxen, or ketoprofen unless instructed by your doctor. These medicines may hide a fever. Do not become pregnant while taking this medicine. Women should inform their doctor if they wish to become pregnant or think they might be pregnant. There is a potential for serious side effects to an unborn child. Talk to your health care professional or pharmacist for more information. Do not breast-feed an infant while taking this medicine. Men are advised not to father a child while receiving this medicine. This product may contain alcohol. Ask your pharmacist or healthcare provider if this medicine contains alcohol. Be sure to tell all healthcare providers you are taking this medicine. Certain medicines, like metronidazole and disulfiram, can cause an unpleasant reaction when taken with alcohol. The reaction includes flushing, headache, nausea, vomiting, sweating, and increased thirst. The reaction can last from 30 minutes to several hours. What side effects may I notice from receiving this medicine? Side effects that you should report to your doctor or health care professional as soon as possible:  allergic reactions like skin rash, itching or hives, swelling of the face, lips, or tongue  breathing problems  changes in vision  fast, irregular heartbeat  high or  low blood pressure  mouth sores  pain, tingling, numbness in the hands or feet  signs of decreased platelets or bleeding - bruising, pinpoint red spots on the skin, black, tarry stools, blood in the urine  signs of decreased red blood cells - unusually weak or tired, feeling faint or lightheaded, falls  signs of infection - fever or chills, cough, sore throat, pain or difficulty passing urine  signs and symptoms of liver injury like dark yellow or brown urine; general ill feeling or flu-like symptoms; light-colored stools; loss of appetite; nausea; right upper belly pain; unusually weak or tired; yellowing of the eyes or  skin  swelling of the ankles, feet, hands  unusually slow heartbeat Side effects that usually do not require medical attention (report to your doctor or health care professional if they continue or are bothersome):  diarrhea  hair loss  loss of appetite  muscle or joint pain  nausea, vomiting  pain, redness, or irritation at site where injected  tiredness This list may not describe all possible side effects. Call your doctor for medical advice about side effects. You may report side effects to FDA at 1-800-FDA-1088. Where should I keep my medicine? This drug is given in a hospital or clinic and will not be stored at home. NOTE: This sheet is a summary. It may not cover all possible information. If you have questions about this medicine, talk to your doctor, pharmacist, or health care provider.  2020 Elsevier/Gold Standard (2016-10-08 13:14:55)

## 2019-02-23 ENCOUNTER — Other Ambulatory Visit: Payer: Self-pay

## 2019-02-23 ENCOUNTER — Ambulatory Visit
Admission: RE | Admit: 2019-02-23 | Discharge: 2019-02-23 | Disposition: A | Discharge status: Home / Self Care | Payer: Medicaid Other | Source: Ambulatory Visit | Attending: Radiation Oncology | Admitting: Radiation Oncology

## 2019-02-23 ENCOUNTER — Encounter (HOSPITAL_COMMUNITY): Payer: Self-pay | Admitting: Hematology and Oncology

## 2019-02-23 DIAGNOSIS — Z510 Encounter for antineoplastic radiation therapy: Secondary | ICD-10-CM | POA: Diagnosis not present

## 2019-02-24 ENCOUNTER — Other Ambulatory Visit: Payer: Self-pay

## 2019-02-24 ENCOUNTER — Ambulatory Visit: Payer: Self-pay | Admitting: Urology

## 2019-02-24 ENCOUNTER — Ambulatory Visit
Admission: RE | Admit: 2019-02-24 | Discharge: 2019-02-24 | Disposition: A | Discharge status: Home / Self Care | Payer: Medicaid Other | Source: Ambulatory Visit | Attending: Radiation Oncology | Admitting: Radiation Oncology

## 2019-02-24 ENCOUNTER — Telehealth: Payer: Self-pay | Admitting: Hematology and Oncology

## 2019-02-24 DIAGNOSIS — Z510 Encounter for antineoplastic radiation therapy: Secondary | ICD-10-CM | POA: Diagnosis not present

## 2019-02-24 NOTE — Telephone Encounter (Signed)
Scheduled per los. Called and left msg. Mailed printout  °

## 2019-02-25 ENCOUNTER — Ambulatory Visit
Admission: RE | Admit: 2019-02-25 | Discharge: 2019-02-25 | Disposition: A | Discharge status: Home / Self Care | Payer: Medicaid Other | Source: Ambulatory Visit | Attending: Radiation Oncology | Admitting: Radiation Oncology

## 2019-02-25 ENCOUNTER — Other Ambulatory Visit: Payer: Self-pay

## 2019-02-25 ENCOUNTER — Telehealth: Payer: Self-pay | Admitting: Radiation Oncology

## 2019-02-25 DIAGNOSIS — Z510 Encounter for antineoplastic radiation therapy: Secondary | ICD-10-CM | POA: Diagnosis not present

## 2019-02-25 NOTE — Telephone Encounter (Signed)
Phoned patient as requested by Laurence Compton, AD. Explained the transportation we provided him will not be available tomorrow due to inclement weather. Explained Dr. Tammi Klippel is aware of this and confirms it is ok for him to miss this one treatment. Explained his transportation will pick him up today and Monday as planned just not tomorrow. Patient verbalized understanding and expressed appreciation for the call.

## 2019-02-26 ENCOUNTER — Ambulatory Visit: Payer: Medicaid Other

## 2019-03-01 ENCOUNTER — Inpatient Hospital Stay: Payer: Medicaid Other

## 2019-03-01 ENCOUNTER — Other Ambulatory Visit: Payer: Self-pay

## 2019-03-01 ENCOUNTER — Ambulatory Visit
Admission: RE | Admit: 2019-03-01 | Discharge: 2019-03-01 | Disposition: A | Discharge status: Home / Self Care | Payer: Medicaid Other | Source: Ambulatory Visit | Attending: Radiation Oncology | Admitting: Radiation Oncology

## 2019-03-01 ENCOUNTER — Ambulatory Visit: Payer: Self-pay | Admitting: Family Medicine

## 2019-03-01 ENCOUNTER — Inpatient Hospital Stay: Payer: Medicaid Other | Admitting: Nutrition

## 2019-03-01 ENCOUNTER — Other Ambulatory Visit: Payer: Self-pay | Admitting: Hematology and Oncology

## 2019-03-01 VITALS — BP 115/67 | HR 96 | Temp 98.9°F | Resp 20 | Ht 72.0 in | Wt 166.2 lb

## 2019-03-01 DIAGNOSIS — Z5111 Encounter for antineoplastic chemotherapy: Secondary | ICD-10-CM | POA: Diagnosis not present

## 2019-03-01 DIAGNOSIS — C3490 Malignant neoplasm of unspecified part of unspecified bronchus or lung: Secondary | ICD-10-CM

## 2019-03-01 DIAGNOSIS — Z95828 Presence of other vascular implants and grafts: Secondary | ICD-10-CM

## 2019-03-01 DIAGNOSIS — Z510 Encounter for antineoplastic radiation therapy: Secondary | ICD-10-CM | POA: Diagnosis not present

## 2019-03-01 LAB — CMP (CANCER CENTER ONLY)
ALT: 84 U/L — ABNORMAL HIGH (ref 0–44)
AST: 58 U/L — ABNORMAL HIGH (ref 15–41)
Albumin: 2.2 g/dL — ABNORMAL LOW (ref 3.5–5.0)
Alkaline Phosphatase: 91 U/L (ref 38–126)
Anion gap: 10 (ref 5–15)
BUN: 7 mg/dL (ref 6–20)
CO2: 26 mmol/L (ref 22–32)
Calcium: 8.7 mg/dL — ABNORMAL LOW (ref 8.9–10.3)
Chloride: 96 mmol/L — ABNORMAL LOW (ref 98–111)
Creatinine: 0.8 mg/dL (ref 0.61–1.24)
GFR, Est AFR Am: >60 mL/min (ref >60)
GFR, Estimated: >60 mL/min (ref >60)
Glucose, Bld: 99 mg/dL (ref 70–99)
Potassium: 4.2 mmol/L (ref 3.5–5.1)
Sodium: 132 mmol/L — ABNORMAL LOW (ref 135–145)
Total Bilirubin: 0.5 mg/dL (ref 0.3–1.2)
Total Protein: 6.8 g/dL (ref 6.5–8.1)

## 2019-03-01 LAB — CBC WITH DIFFERENTIAL (CANCER CENTER ONLY)
Abs Immature Granulocytes: 0.1 10*3/uL — ABNORMAL HIGH (ref 0.00–0.07)
Basophils Absolute: 0 10*3/uL (ref 0.0–0.1)
Basophils Relative: 0 %
Eosinophils Absolute: 0 10*3/uL (ref 0.0–0.5)
Eosinophils Relative: 0 %
HCT: 33 % — ABNORMAL LOW (ref 39.0–52.0)
Hemoglobin: 10.7 g/dL — ABNORMAL LOW (ref 13.0–17.0)
Immature Granulocytes: 1 %
Lymphocytes Relative: 17 %
Lymphs Abs: 1.4 10*3/uL (ref 0.7–4.0)
MCH: 27.3 pg (ref 26.0–34.0)
MCHC: 32.4 g/dL (ref 30.0–36.0)
MCV: 84.2 fL (ref 80.0–100.0)
Monocytes Absolute: 1 10*3/uL (ref 0.1–1.0)
Monocytes Relative: 13 %
Neutro Abs: 5.6 10*3/uL (ref 1.7–7.7)
Neutrophils Relative %: 69 %
Platelet Count: 272 10*3/uL (ref 150–400)
RBC: 3.92 MIL/uL — ABNORMAL LOW (ref 4.22–5.81)
RDW: 17.3 % — ABNORMAL HIGH (ref 11.5–15.5)
WBC Count: 8.1 10*3/uL (ref 4.0–10.5)
nRBC: 0 % (ref 0.0–0.2)

## 2019-03-01 MED ORDER — PALONOSETRON HCL INJECTION 0.25 MG/5ML
INTRAVENOUS | Status: AC
  Filled 2019-03-01: qty 5

## 2019-03-01 MED ORDER — SODIUM CHLORIDE 0.9% FLUSH
10.0000 mL | INTRAVENOUS | Status: DC | PRN
  Administered 2019-03-01: 10 mL
  Filled 2019-03-01: qty 10

## 2019-03-01 MED ORDER — DIPHENHYDRAMINE HCL 50 MG/ML IJ SOLN
INTRAMUSCULAR | Status: AC
  Filled 2019-03-01: qty 1

## 2019-03-01 MED ORDER — FAMOTIDINE IN NACL 20-0.9 MG/50ML-% IV SOLN
20.0000 mg | Freq: Once | INTRAVENOUS | Status: AC
  Administered 2019-03-01: 20 mg via INTRAVENOUS

## 2019-03-01 MED ORDER — SODIUM CHLORIDE 0.9 % IV SOLN
267.2000 mg | Freq: Once | INTRAVENOUS | Status: AC
  Administered 2019-03-01: 14:00:00 270 mg via INTRAVENOUS
  Filled 2019-03-01: qty 27

## 2019-03-01 MED ORDER — DEXAMETHASONE SODIUM PHOSPHATE 10 MG/ML IJ SOLN
10.0000 mg | Freq: Once | INTRAMUSCULAR | Status: AC
  Administered 2019-03-01: 12:00:00 10 mg via INTRAVENOUS

## 2019-03-01 MED ORDER — PALONOSETRON HCL INJECTION 0.25 MG/5ML
0.2500 mg | Freq: Once | INTRAVENOUS | Status: AC
  Administered 2019-03-01: 0.25 mg via INTRAVENOUS

## 2019-03-01 MED ORDER — HEPARIN SOD (PORK) LOCK FLUSH 100 UNIT/ML IV SOLN
500.0000 [IU] | Freq: Once | INTRAVENOUS | Status: AC | PRN
  Administered 2019-03-01: 500 [IU]
  Filled 2019-03-01: qty 5

## 2019-03-01 MED ORDER — SODIUM CHLORIDE 0.9 % IV SOLN
Freq: Once | INTRAVENOUS | Status: AC
  Filled 2019-03-01: qty 250

## 2019-03-01 MED ORDER — DEXAMETHASONE SODIUM PHOSPHATE 10 MG/ML IJ SOLN
INTRAMUSCULAR | Status: AC
  Filled 2019-03-01: qty 1

## 2019-03-01 MED ORDER — DIPHENHYDRAMINE HCL 50 MG/ML IJ SOLN
50.0000 mg | Freq: Once | INTRAMUSCULAR | Status: AC
  Administered 2019-03-01: 50 mg via INTRAVENOUS

## 2019-03-01 MED ORDER — SODIUM CHLORIDE 0.9% FLUSH
10.0000 mL | Freq: Once | INTRAVENOUS | Status: AC
  Administered 2019-03-01: 10 mL
  Filled 2019-03-01: qty 10

## 2019-03-01 MED ORDER — SODIUM CHLORIDE 0.9 % IV SOLN
45.0000 mg/m2 | Freq: Once | INTRAVENOUS | Status: AC
  Administered 2019-03-01: 90 mg via INTRAVENOUS
  Filled 2019-03-01: qty 15

## 2019-03-01 MED ORDER — FAMOTIDINE IN NACL 20-0.9 MG/50ML-% IV SOLN
INTRAVENOUS | Status: AC
  Filled 2019-03-01: qty 50

## 2019-03-01 NOTE — Patient Instructions (Signed)
[facility] Discharge Instructions for Patients Receiving Chemotherapy  Today you received the following chemotherapy agents Taxol and Carboplatin   To help prevent nausea and vomiting after your treatment, we encourage you to take your nausea medication as directed. No Zofran for the next 3 days.    If you develop nausea and vomiting that is not controlled by your nausea medication, call the clinic.   BELOW ARE SYMPTOMS THAT SHOULD BE REPORTED IMMEDIATELY:  *FEVER GREATER THAN 100.5 F  *CHILLS WITH OR WITHOUT FEVER  NAUSEA AND VOMITING THAT IS NOT CONTROLLED WITH YOUR NAUSEA MEDICATION  *UNUSUAL SHORTNESS OF BREATH  *UNUSUAL BRUISING OR BLEEDING  TENDERNESS IN MOUTH AND THROAT WITH OR WITHOUT PRESENCE OF ULCERS  *URINARY PROBLEMS  *BOWEL PROBLEMS  UNUSUAL RASH Items with * indicate a potential emergency and should be followed up as soon as possible.  Feel free to call the clinic should you have any questions or concerns. The clinic phone number is [phone].  Please show the [facility] at check-in to the Emergency Department and triage nurse.

## 2019-03-01 NOTE — Progress Notes (Signed)
Ok to proceed with treatment today with HR and CBC and CMP per MD Lorenso Courier. MD Lorenso Courier at bedside to see patient in infusion room today.

## 2019-03-01 NOTE — Progress Notes (Signed)
Nutrition follow-up completed with patient during infusion for metastatic non-small cell lung cancer. Weight improved and was documented as 166.2 pounds January 11 from 163.2 pounds. Noted labs: Sodium 132 and albumin 2.2. Patient reports nutrition impacts continue including nausea, taste alterations, and poor appetite. Patient is drinking Ensure but declines additional samples today.  Nutrition diagnosis: Unintended weight loss has improved.  Intervention: Patient was educated to continue strategies for increased calories and protein to promote continued weight gain. I will continue to provide samples as patient requests. Recommended patient continue small frequent meals and snacks.  Monitoring, evaluation, goals: Patient will tolerate increased calories and protein to minimize weight loss.  Next visit: To be scheduled as needed.  **Disclaimer: This note was dictated with voice recognition software. Similar sounding words can inadvertently be transcribed and this note may contain transcription errors which may not have been corrected upon publication of note.**

## 2019-03-02 ENCOUNTER — Ambulatory Visit
Admission: RE | Admit: 2019-03-02 | Discharge: 2019-03-02 | Disposition: A | Discharge status: Home / Self Care | Payer: Medicaid Other | Source: Ambulatory Visit | Attending: Radiation Oncology | Admitting: Radiation Oncology

## 2019-03-02 ENCOUNTER — Other Ambulatory Visit: Payer: Self-pay

## 2019-03-02 DIAGNOSIS — Z510 Encounter for antineoplastic radiation therapy: Secondary | ICD-10-CM | POA: Diagnosis not present

## 2019-03-03 ENCOUNTER — Ambulatory Visit
Admission: RE | Admit: 2019-03-03 | Discharge: 2019-03-03 | Disposition: A | Discharge status: Home / Self Care | Payer: Medicaid Other | Source: Ambulatory Visit | Attending: Radiation Oncology | Admitting: Radiation Oncology

## 2019-03-03 ENCOUNTER — Other Ambulatory Visit: Payer: Self-pay | Admitting: Radiation Therapy

## 2019-03-03 ENCOUNTER — Other Ambulatory Visit: Payer: Self-pay

## 2019-03-03 DIAGNOSIS — Z510 Encounter for antineoplastic radiation therapy: Secondary | ICD-10-CM | POA: Diagnosis not present

## 2019-03-04 ENCOUNTER — Other Ambulatory Visit: Payer: Self-pay

## 2019-03-04 ENCOUNTER — Ambulatory Visit
Admission: RE | Admit: 2019-03-04 | Discharge: 2019-03-04 | Disposition: A | Discharge status: Home / Self Care | Payer: Medicaid Other | Source: Ambulatory Visit | Attending: Radiation Oncology | Admitting: Radiation Oncology

## 2019-03-04 DIAGNOSIS — Z510 Encounter for antineoplastic radiation therapy: Secondary | ICD-10-CM | POA: Diagnosis not present

## 2019-03-05 ENCOUNTER — Encounter: Payer: Self-pay | Admitting: Radiation Oncology

## 2019-03-05 ENCOUNTER — Ambulatory Visit
Admission: RE | Admit: 2019-03-05 | Discharge: 2019-03-05 | Disposition: A | Discharge status: Home / Self Care | Payer: Medicaid Other | Source: Ambulatory Visit | Attending: Radiation Oncology | Admitting: Radiation Oncology

## 2019-03-05 ENCOUNTER — Other Ambulatory Visit: Payer: Self-pay

## 2019-03-05 DIAGNOSIS — C3412 Malignant neoplasm of upper lobe, left bronchus or lung: Secondary | ICD-10-CM

## 2019-03-05 DIAGNOSIS — Z510 Encounter for antineoplastic radiation therapy: Secondary | ICD-10-CM | POA: Diagnosis not present

## 2019-03-05 NOTE — Progress Notes (Signed)
The daily radiation positioning imaging (cone beam CT) used for image guidance showed increasing cavitation of the treated primary tumor and an enlarged volume, shown below.  I am optimistic that this central cavitation reflects necrosis in the tumor suggesting a brisk response to treatment.  However, due to the expanding size/border, the patient underwent repeat planning CT today to consider a field modification ensuring total coverage.  Original Planning CT   Today's IGRT CT

## 2019-03-08 ENCOUNTER — Ambulatory Visit
Admission: RE | Admit: 2019-03-08 | Discharge: 2019-03-08 | Disposition: A | Discharge status: Home / Self Care | Payer: Medicaid Other | Source: Ambulatory Visit | Attending: Radiation Oncology | Admitting: Radiation Oncology

## 2019-03-08 ENCOUNTER — Other Ambulatory Visit: Payer: Self-pay

## 2019-03-08 ENCOUNTER — Ambulatory Visit: Payer: Self-pay | Admitting: Nurse Practitioner

## 2019-03-08 DIAGNOSIS — Z510 Encounter for antineoplastic radiation therapy: Secondary | ICD-10-CM | POA: Diagnosis not present

## 2019-03-09 ENCOUNTER — Other Ambulatory Visit: Payer: Self-pay

## 2019-03-09 ENCOUNTER — Ambulatory Visit
Admission: RE | Admit: 2019-03-09 | Discharge: 2019-03-09 | Disposition: A | Discharge status: Home / Self Care | Payer: Medicaid Other | Source: Ambulatory Visit | Attending: Radiation Oncology | Admitting: Radiation Oncology

## 2019-03-09 DIAGNOSIS — Z510 Encounter for antineoplastic radiation therapy: Secondary | ICD-10-CM | POA: Diagnosis not present

## 2019-03-10 ENCOUNTER — Other Ambulatory Visit: Payer: Self-pay

## 2019-03-10 ENCOUNTER — Ambulatory Visit
Admission: RE | Admit: 2019-03-10 | Discharge: 2019-03-10 | Disposition: A | Discharge status: Home / Self Care | Payer: Medicaid Other | Source: Ambulatory Visit | Attending: Radiation Oncology | Admitting: Radiation Oncology

## 2019-03-10 DIAGNOSIS — Z510 Encounter for antineoplastic radiation therapy: Secondary | ICD-10-CM | POA: Diagnosis not present

## 2019-03-11 ENCOUNTER — Ambulatory Visit: Payer: Medicaid Other

## 2019-03-12 ENCOUNTER — Ambulatory Visit: Payer: Medicaid Other

## 2019-03-13 ENCOUNTER — Other Ambulatory Visit: Payer: Self-pay | Admitting: Hematology and Oncology

## 2019-03-15 ENCOUNTER — Other Ambulatory Visit: Payer: Self-pay

## 2019-03-15 ENCOUNTER — Inpatient Hospital Stay: Payer: Medicaid Other

## 2019-03-15 ENCOUNTER — Ambulatory Visit: Payer: Medicaid Other

## 2019-03-15 ENCOUNTER — Inpatient Hospital Stay (HOSPITAL_BASED_OUTPATIENT_CLINIC_OR_DEPARTMENT_OTHER): Payer: Medicaid Other | Admitting: Hematology and Oncology

## 2019-03-15 ENCOUNTER — Ambulatory Visit
Admission: RE | Admit: 2019-03-15 | Discharge: 2019-03-15 | Disposition: A | Discharge status: Home / Self Care | Payer: Medicaid Other | Source: Ambulatory Visit | Attending: Radiation Oncology | Admitting: Radiation Oncology

## 2019-03-15 VITALS — BP 130/85 | HR 122 | Temp 98.9°F | Resp 18 | Ht 72.0 in | Wt 161.4 lb

## 2019-03-15 DIAGNOSIS — Z95828 Presence of other vascular implants and grafts: Secondary | ICD-10-CM | POA: Insufficient documentation

## 2019-03-15 DIAGNOSIS — C7931 Secondary malignant neoplasm of brain: Secondary | ICD-10-CM

## 2019-03-15 DIAGNOSIS — Z5111 Encounter for antineoplastic chemotherapy: Secondary | ICD-10-CM | POA: Diagnosis not present

## 2019-03-15 DIAGNOSIS — C3492 Malignant neoplasm of unspecified part of left bronchus or lung: Secondary | ICD-10-CM

## 2019-03-15 DIAGNOSIS — C3490 Malignant neoplasm of unspecified part of unspecified bronchus or lung: Secondary | ICD-10-CM

## 2019-03-15 DIAGNOSIS — Z510 Encounter for antineoplastic radiation therapy: Secondary | ICD-10-CM | POA: Diagnosis not present

## 2019-03-15 LAB — CMP (CANCER CENTER ONLY)
ALT: 50 U/L — ABNORMAL HIGH (ref 0–44)
AST: 43 U/L — ABNORMAL HIGH (ref 15–41)
Albumin: 2.1 g/dL — ABNORMAL LOW (ref 3.5–5.0)
Alkaline Phosphatase: 95 U/L (ref 38–126)
Anion gap: 13 (ref 5–15)
BUN: <4 mg/dL — ABNORMAL LOW (ref 6–20)
CO2: 25 mmol/L (ref 22–32)
Calcium: 8.7 mg/dL — ABNORMAL LOW (ref 8.9–10.3)
Chloride: 95 mmol/L — ABNORMAL LOW (ref 98–111)
Creatinine: 0.82 mg/dL (ref 0.61–1.24)
GFR, Est AFR Am: >60 mL/min (ref >60)
GFR, Estimated: >60 mL/min (ref >60)
Glucose, Bld: 97 mg/dL (ref 70–99)
Potassium: 3.9 mmol/L (ref 3.5–5.1)
Sodium: 133 mmol/L — ABNORMAL LOW (ref 135–145)
Total Bilirubin: 0.5 mg/dL (ref 0.3–1.2)
Total Protein: 7.3 g/dL (ref 6.5–8.1)

## 2019-03-15 LAB — CBC WITH DIFFERENTIAL (CANCER CENTER ONLY)
Abs Immature Granulocytes: 0.05 10*3/uL (ref 0.00–0.07)
Basophils Absolute: 0 10*3/uL (ref 0.0–0.1)
Basophils Relative: 0 %
Eosinophils Absolute: 0 10*3/uL (ref 0.0–0.5)
Eosinophils Relative: 0 %
HCT: 33.1 % — ABNORMAL LOW (ref 39.0–52.0)
Hemoglobin: 10.8 g/dL — ABNORMAL LOW (ref 13.0–17.0)
Immature Granulocytes: 1 %
Lymphocytes Relative: 12 %
Lymphs Abs: 0.9 10*3/uL (ref 0.7–4.0)
MCH: 27.4 pg (ref 26.0–34.0)
MCHC: 32.6 g/dL (ref 30.0–36.0)
MCV: 84 fL (ref 80.0–100.0)
Monocytes Absolute: 0.8 10*3/uL (ref 0.1–1.0)
Monocytes Relative: 11 %
Neutro Abs: 5.9 10*3/uL (ref 1.7–7.7)
Neutrophils Relative %: 76 %
Platelet Count: 241 10*3/uL (ref 150–400)
RBC: 3.94 MIL/uL — ABNORMAL LOW (ref 4.22–5.81)
RDW: 17.3 % — ABNORMAL HIGH (ref 11.5–15.5)
WBC Count: 7.6 10*3/uL (ref 4.0–10.5)
nRBC: 0 % (ref 0.0–0.2)

## 2019-03-15 MED ORDER — SODIUM CHLORIDE 0.9% FLUSH
10.0000 mL | INTRAVENOUS | Status: DC | PRN
  Administered 2019-03-15: 15:00:00 10 mL
  Filled 2019-03-15: qty 10

## 2019-03-15 MED ORDER — HEPARIN SOD (PORK) LOCK FLUSH 100 UNIT/ML IV SOLN
500.0000 [IU] | Freq: Once | INTRAVENOUS | Status: AC | PRN
  Administered 2019-03-15: 500 [IU]
  Filled 2019-03-15: qty 5
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
## 2019-03-16 ENCOUNTER — Ambulatory Visit
Admission: RE | Admit: 2019-03-16 | Discharge: 2019-03-16 | Disposition: A | Discharge status: Home / Self Care | Payer: Medicaid Other | Source: Ambulatory Visit | Attending: Radiation Oncology | Admitting: Radiation Oncology

## 2019-03-16 ENCOUNTER — Encounter: Payer: Self-pay | Admitting: Hematology and Oncology

## 2019-03-16 ENCOUNTER — Other Ambulatory Visit: Payer: Self-pay

## 2019-03-16 DIAGNOSIS — Z510 Encounter for antineoplastic radiation therapy: Secondary | ICD-10-CM | POA: Diagnosis not present

## 2019-03-17 ENCOUNTER — Other Ambulatory Visit: Payer: Self-pay

## 2019-03-17 ENCOUNTER — Ambulatory Visit
Admission: RE | Admit: 2019-03-17 | Discharge: 2019-03-17 | Disposition: A | Discharge status: Home / Self Care | Payer: Medicaid Other | Source: Ambulatory Visit | Attending: Radiation Oncology | Admitting: Radiation Oncology

## 2019-03-17 DIAGNOSIS — Z510 Encounter for antineoplastic radiation therapy: Secondary | ICD-10-CM | POA: Diagnosis not present

## 2019-03-18 ENCOUNTER — Telehealth: Payer: Self-pay | Admitting: Hematology and Oncology

## 2019-03-18 ENCOUNTER — Ambulatory Visit
Admission: RE | Admit: 2019-03-18 | Discharge: 2019-03-18 | Disposition: A | Discharge status: Home / Self Care | Payer: Medicaid Other | Source: Ambulatory Visit | Attending: Radiation Oncology | Admitting: Radiation Oncology

## 2019-03-18 ENCOUNTER — Other Ambulatory Visit: Payer: Self-pay

## 2019-03-18 ENCOUNTER — Telehealth: Payer: Self-pay | Admitting: *Deleted

## 2019-03-18 DIAGNOSIS — Z510 Encounter for antineoplastic radiation therapy: Secondary | ICD-10-CM | POA: Diagnosis not present

## 2019-03-18 NOTE — Telephone Encounter (Signed)
Scheduled appt per 1/25 sch message  - pt sister aware of appts

## 2019-03-18 NOTE — Telephone Encounter (Signed)
Scheduled per los. Called and left msg. Mailed printout  °

## 2019-03-18 NOTE — Telephone Encounter (Signed)
Received vm message from patient's sister, [name] requesting call back.  TCT [name]. No answer but was able to leave vm message for her to call back @ [phone]

## 2019-03-19 ENCOUNTER — Inpatient Hospital Stay: Payer: Medicaid Other

## 2019-03-19 ENCOUNTER — Other Ambulatory Visit: Payer: Self-pay

## 2019-03-19 ENCOUNTER — Ambulatory Visit
Admission: RE | Admit: 2019-03-19 | Discharge: 2019-03-19 | Disposition: A | Discharge status: Home / Self Care | Payer: Medicaid Other | Source: Ambulatory Visit | Attending: Radiation Oncology | Admitting: Radiation Oncology

## 2019-03-19 ENCOUNTER — Other Ambulatory Visit: Payer: Self-pay | Admitting: Hematology and Oncology

## 2019-03-19 VITALS — BP 126/70 | HR 94 | Temp 97.8°F | Resp 18

## 2019-03-19 DIAGNOSIS — Z5111 Encounter for antineoplastic chemotherapy: Secondary | ICD-10-CM | POA: Diagnosis not present

## 2019-03-19 DIAGNOSIS — C3490 Malignant neoplasm of unspecified part of unspecified bronchus or lung: Secondary | ICD-10-CM

## 2019-03-19 DIAGNOSIS — Z510 Encounter for antineoplastic radiation therapy: Secondary | ICD-10-CM | POA: Diagnosis not present

## 2019-03-19 MED ORDER — FAMOTIDINE IN NACL 20-0.9 MG/50ML-% IV SOLN
20.0000 mg | Freq: Once | INTRAVENOUS | Status: AC
  Administered 2019-03-19: 14:00:00 20 mg via INTRAVENOUS

## 2019-03-19 MED ORDER — SODIUM CHLORIDE 0.9 % IV SOLN
Freq: Once | INTRAVENOUS | Status: AC
  Filled 2019-03-19: qty 250

## 2019-03-19 MED ORDER — DIPHENHYDRAMINE HCL 50 MG/ML IJ SOLN
50.0000 mg | Freq: Once | INTRAMUSCULAR | Status: AC
  Administered 2019-03-19: 14:00:00 50 mg via INTRAVENOUS

## 2019-03-19 MED ORDER — PALONOSETRON HCL INJECTION 0.25 MG/5ML
INTRAVENOUS | Status: AC
  Filled 2019-03-19: qty 5

## 2019-03-19 MED ORDER — SODIUM CHLORIDE 0.9% FLUSH
10.0000 mL | INTRAVENOUS | Status: DC | PRN
  Administered 2019-03-19: 10 mL
  Filled 2019-03-19: qty 10

## 2019-03-19 MED ORDER — HEPARIN SOD (PORK) LOCK FLUSH 100 UNIT/ML IV SOLN
500.0000 [IU] | Freq: Once | INTRAVENOUS | Status: AC | PRN
  Administered 2019-03-19: 500 [IU]
  Filled 2019-03-19: qty 5

## 2019-03-19 MED ORDER — DIPHENHYDRAMINE HCL 50 MG/ML IJ SOLN
INTRAMUSCULAR | Status: AC
  Filled 2019-03-19: qty 1

## 2019-03-19 MED ORDER — SODIUM CHLORIDE 0.9 % IV SOLN
253.4000 mg | Freq: Once | INTRAVENOUS | Status: AC
  Administered 2019-03-19: 17:00:00 250 mg via INTRAVENOUS
  Filled 2019-03-19: qty 25

## 2019-03-19 MED ORDER — SODIUM CHLORIDE 0.9 % IV SOLN
45.0000 mg/m2 | Freq: Once | INTRAVENOUS | Status: AC
  Administered 2019-03-19: 84 mg via INTRAVENOUS
  Filled 2019-03-19: qty 14

## 2019-03-19 MED ORDER — SODIUM CHLORIDE 0.9 % IV SOLN
20.0000 mg | Freq: Once | INTRAVENOUS | Status: AC
  Administered 2019-03-19: 20 mg via INTRAVENOUS
  Filled 2019-03-19: qty 20

## 2019-03-19 MED ORDER — FAMOTIDINE IN NACL 20-0.9 MG/50ML-% IV SOLN
INTRAVENOUS | Status: AC
  Filled 2019-03-19: qty 50

## 2019-03-19 MED ORDER — PALONOSETRON HCL INJECTION 0.25 MG/5ML
0.2500 mg | Freq: Once | INTRAVENOUS | Status: AC
  Administered 2019-03-19: 14:00:00 0.25 mg via INTRAVENOUS

## 2019-03-19 NOTE — Patient Instructions (Signed)
[facility] Discharge Instructions for Patients Receiving Chemotherapy  Today you received the following chemotherapy agents Taxol and Carboplatin   To help prevent nausea and vomiting after your treatment, we encourage you to take your nausea medication as directed. No Zofran for the next 3 days.    If you develop nausea and vomiting that is not controlled by your nausea medication, call the clinic.   BELOW ARE SYMPTOMS THAT SHOULD BE REPORTED IMMEDIATELY:  *FEVER GREATER THAN 100.5 F  *CHILLS WITH OR WITHOUT FEVER  NAUSEA AND VOMITING THAT IS NOT CONTROLLED WITH YOUR NAUSEA MEDICATION  *UNUSUAL SHORTNESS OF BREATH  *UNUSUAL BRUISING OR BLEEDING  TENDERNESS IN MOUTH AND THROAT WITH OR WITHOUT PRESENCE OF ULCERS  *URINARY PROBLEMS  *BOWEL PROBLEMS  UNUSUAL RASH Items with * indicate a potential emergency and should be followed up as soon as possible.  Feel free to call the clinic should you have any questions or concerns. The clinic phone number is [phone].  Please show the [employer] at check-in to the Emergency Department and triage nurse.

## 2019-03-22 ENCOUNTER — Ambulatory Visit: Payer: Medicaid Other

## 2019-03-23 ENCOUNTER — Ambulatory Visit
Admission: RE | Admit: 2019-03-23 | Discharge: 2019-03-23 | Disposition: A | Discharge status: Home / Self Care | Payer: Medicaid Other | Source: Ambulatory Visit | Attending: Radiation Oncology | Admitting: Radiation Oncology

## 2019-03-23 ENCOUNTER — Other Ambulatory Visit: Payer: Self-pay

## 2019-03-23 DIAGNOSIS — C3412 Malignant neoplasm of upper lobe, left bronchus or lung: Secondary | ICD-10-CM | POA: Insufficient documentation

## 2019-03-23 DIAGNOSIS — Z510 Encounter for antineoplastic radiation therapy: Secondary | ICD-10-CM | POA: Diagnosis present

## 2019-03-24 ENCOUNTER — Other Ambulatory Visit: Payer: Self-pay

## 2019-03-24 ENCOUNTER — Ambulatory Visit
Admission: RE | Admit: 2019-03-24 | Discharge: 2019-03-24 | Disposition: A | Discharge status: Home / Self Care | Payer: Medicaid Other | Source: Ambulatory Visit | Attending: Radiation Oncology | Admitting: Radiation Oncology

## 2019-03-24 DIAGNOSIS — Z510 Encounter for antineoplastic radiation therapy: Secondary | ICD-10-CM | POA: Diagnosis not present

## 2019-03-25 ENCOUNTER — Other Ambulatory Visit: Payer: Self-pay | Admitting: Hematology and Oncology

## 2019-03-25 ENCOUNTER — Other Ambulatory Visit: Payer: Self-pay

## 2019-03-25 ENCOUNTER — Ambulatory Visit
Admission: RE | Admit: 2019-03-25 | Discharge: 2019-03-25 | Disposition: A | Discharge status: Home / Self Care | Payer: Medicaid Other | Source: Ambulatory Visit | Attending: Radiation Oncology | Admitting: Radiation Oncology

## 2019-03-25 DIAGNOSIS — Z510 Encounter for antineoplastic radiation therapy: Secondary | ICD-10-CM | POA: Diagnosis not present

## 2019-03-26 ENCOUNTER — Other Ambulatory Visit: Payer: Self-pay

## 2019-03-26 ENCOUNTER — Telehealth: Payer: Self-pay

## 2019-03-26 ENCOUNTER — Ambulatory Visit: Payer: Medicaid Other

## 2019-03-26 ENCOUNTER — Inpatient Hospital Stay: Payer: Medicaid Other

## 2019-03-26 ENCOUNTER — Ambulatory Visit
Admission: RE | Admit: 2019-03-26 | Discharge: 2019-03-26 | Disposition: A | Discharge status: Home / Self Care | Payer: Medicaid Other | Source: Ambulatory Visit | Attending: Radiation Oncology | Admitting: Radiation Oncology

## 2019-03-26 ENCOUNTER — Inpatient Hospital Stay: Payer: Medicaid Other | Attending: Hematology and Oncology

## 2019-03-26 DIAGNOSIS — Z79899 Other long term (current) drug therapy: Secondary | ICD-10-CM | POA: Insufficient documentation

## 2019-03-26 DIAGNOSIS — R112 Nausea with vomiting, unspecified: Secondary | ICD-10-CM | POA: Insufficient documentation

## 2019-03-26 DIAGNOSIS — R197 Diarrhea, unspecified: Secondary | ICD-10-CM | POA: Insufficient documentation

## 2019-03-26 DIAGNOSIS — R05 Cough: Secondary | ICD-10-CM | POA: Insufficient documentation

## 2019-03-26 DIAGNOSIS — C3412 Malignant neoplasm of upper lobe, left bronchus or lung: Secondary | ICD-10-CM | POA: Insufficient documentation

## 2019-03-26 DIAGNOSIS — F1490 Cocaine use, unspecified, uncomplicated: Secondary | ICD-10-CM | POA: Insufficient documentation

## 2019-03-26 DIAGNOSIS — Z510 Encounter for antineoplastic radiation therapy: Secondary | ICD-10-CM | POA: Diagnosis not present

## 2019-03-26 DIAGNOSIS — R634 Abnormal weight loss: Secondary | ICD-10-CM | POA: Insufficient documentation

## 2019-03-26 DIAGNOSIS — C787 Secondary malignant neoplasm of liver and intrahepatic bile duct: Secondary | ICD-10-CM | POA: Insufficient documentation

## 2019-03-26 DIAGNOSIS — Z9221 Personal history of antineoplastic chemotherapy: Secondary | ICD-10-CM | POA: Insufficient documentation

## 2019-03-26 DIAGNOSIS — F1290 Cannabis use, unspecified, uncomplicated: Secondary | ICD-10-CM | POA: Insufficient documentation

## 2019-03-26 DIAGNOSIS — F17210 Nicotine dependence, cigarettes, uncomplicated: Secondary | ICD-10-CM | POA: Insufficient documentation

## 2019-03-26 DIAGNOSIS — Z8249 Family history of ischemic heart disease and other diseases of the circulatory system: Secondary | ICD-10-CM | POA: Insufficient documentation

## 2019-03-26 DIAGNOSIS — Z809 Family history of malignant neoplasm, unspecified: Secondary | ICD-10-CM | POA: Insufficient documentation

## 2019-03-26 DIAGNOSIS — Z923 Personal history of irradiation: Secondary | ICD-10-CM | POA: Insufficient documentation

## 2019-03-26 DIAGNOSIS — Z5111 Encounter for antineoplastic chemotherapy: Secondary | ICD-10-CM | POA: Insufficient documentation

## 2019-03-26 DIAGNOSIS — Z833 Family history of diabetes mellitus: Secondary | ICD-10-CM | POA: Insufficient documentation

## 2019-03-26 DIAGNOSIS — R531 Weakness: Secondary | ICD-10-CM | POA: Insufficient documentation

## 2019-03-26 DIAGNOSIS — C7931 Secondary malignant neoplasm of brain: Secondary | ICD-10-CM | POA: Insufficient documentation

## 2019-03-26 NOTE — Telephone Encounter (Signed)
Patient did not show up for labs and infusion treatment today. Tried to reach him on his mobile and home phone but was not successful. Called his sister Stanton Kidney and she stated that patient planned to be here at 2 PM for treatment. Nurse explained to her that he had labs and chemo for 1030 and 1145 respectively and that radiation is scheduled for 2:30 PM. She responded that patient does not have transportation to be here sooner since he planned for 2 PM. Nurse asked sister to have patient call when she reaches him and she verbalized understanding.

## 2019-03-29 ENCOUNTER — Ambulatory Visit
Admission: RE | Admit: 2019-03-29 | Discharge: 2019-03-29 | Disposition: A | Discharge status: Home / Self Care | Payer: Medicaid Other | Source: Ambulatory Visit | Attending: Radiation Oncology | Admitting: Radiation Oncology

## 2019-03-29 ENCOUNTER — Telehealth: Payer: Self-pay | Admitting: *Deleted

## 2019-03-29 ENCOUNTER — Ambulatory Visit: Payer: Medicaid Other

## 2019-03-29 DIAGNOSIS — Z510 Encounter for antineoplastic radiation therapy: Secondary | ICD-10-CM | POA: Diagnosis not present

## 2019-03-29 NOTE — Telephone Encounter (Signed)
Received vm message from pt's sister Micheal Vincent. She is requesting a call back regarding pt and his appts and overall condition.  TCT Micheal Vincent and spoke with her.  She states that Micheal Vincent has gotten confused lately and not remembering appts as he should. She is aware that he missed his chemo last week. Reviewed his schedule for this week. Advised that his last chemo will be on 04/02/19 @ 11:00 am.  His last radiation treatment is on 04/05/19.  His appt with Dr. Lorenso Vincent is on 03/31/19 @ 3pm with labs /port flush earlier in the day.  She is aware of the times for these appts. She wanted to review his medications-esp if he was on something for his appetite. Advised that he is on Remeron 7.5 mg at night for appetite and sleep. Advised that I would send home a medication list with Micheal Vincent on Wednesday. She voiced understanding.  She will assist patient on getting here for his appts this week. No other questions or concerns

## 2019-03-30 ENCOUNTER — Ambulatory Visit: Admission: RE | Admit: 2019-03-30 | Payer: Medicaid Other | Source: Ambulatory Visit

## 2019-03-30 ENCOUNTER — Other Ambulatory Visit: Payer: Self-pay

## 2019-03-30 ENCOUNTER — Ambulatory Visit
Admission: RE | Admit: 2019-03-30 | Discharge: 2019-03-30 | Disposition: A | Discharge status: Home / Self Care | Payer: Medicaid Other | Source: Ambulatory Visit | Attending: Radiation Oncology | Admitting: Radiation Oncology

## 2019-03-30 ENCOUNTER — Ambulatory Visit: Payer: Medicaid Other
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
## 2019-03-31 ENCOUNTER — Ambulatory Visit: Payer: Medicaid Other

## 2019-03-31 ENCOUNTER — Inpatient Hospital Stay: Payer: Medicaid Other | Admitting: Hematology and Oncology

## 2019-03-31 ENCOUNTER — Inpatient Hospital Stay: Payer: Medicaid Other

## 2019-03-31 ENCOUNTER — Ambulatory Visit: Payer: Self-pay | Admitting: Urology

## 2019-03-31 ENCOUNTER — Telehealth: Payer: Self-pay | Admitting: *Deleted

## 2019-03-31 ENCOUNTER — Ambulatory Visit
Admission: RE | Admit: 2019-03-31 | Discharge: 2019-03-31 | Disposition: A | Discharge status: Home / Self Care | Payer: Medicaid Other | Source: Ambulatory Visit | Attending: Radiation Oncology | Admitting: Radiation Oncology

## 2019-03-31 DIAGNOSIS — Z510 Encounter for antineoplastic radiation therapy: Secondary | ICD-10-CM | POA: Diagnosis not present

## 2019-03-31 NOTE — Telephone Encounter (Signed)
TCT patient as he did not show for his lab appt and appt with Dr. Lorenso Courier.  Pt did go to his appt for radiation.  Pt was reminded at front desk when he signed in for radiation.   Patient di not answer his phone. No answer at his mother's home. TCT sister Micheal Vincent. VM message left for her advising that Micheal Vincent left the cancer center without seeing Dr. Lorenso Courier or getting his labs done. Re-scheduled patient for tomorrow after after his radiation. We will wait for him at radiation to bring him upstairs.  Received call back from Micheal Vincent. Advised of the above. She plans on meeting him here for his MD appointment. She states he has been getting more forgetful lately and not eating well. She is concerned. Dr. Lorenso Courier is aware of the above. High priority scheduling message sent.

## 2019-04-01 ENCOUNTER — Inpatient Hospital Stay: Payer: Medicaid Other

## 2019-04-01 ENCOUNTER — Inpatient Hospital Stay (HOSPITAL_BASED_OUTPATIENT_CLINIC_OR_DEPARTMENT_OTHER): Payer: Medicaid Other | Admitting: Hematology and Oncology

## 2019-04-01 ENCOUNTER — Ambulatory Visit: Payer: Medicaid Other

## 2019-04-01 ENCOUNTER — Other Ambulatory Visit: Payer: Self-pay

## 2019-04-01 ENCOUNTER — Encounter: Payer: Self-pay | Admitting: Hematology and Oncology

## 2019-04-01 VITALS — BP 149/90 | HR 116 | Temp 100.1°F | Resp 18 | Wt 149.2 lb

## 2019-04-01 DIAGNOSIS — Z79899 Other long term (current) drug therapy: Secondary | ICD-10-CM | POA: Diagnosis not present

## 2019-04-01 DIAGNOSIS — C3490 Malignant neoplasm of unspecified part of unspecified bronchus or lung: Secondary | ICD-10-CM

## 2019-04-01 DIAGNOSIS — C787 Secondary malignant neoplasm of liver and intrahepatic bile duct: Secondary | ICD-10-CM | POA: Diagnosis not present

## 2019-04-01 DIAGNOSIS — F17210 Nicotine dependence, cigarettes, uncomplicated: Secondary | ICD-10-CM | POA: Diagnosis not present

## 2019-04-01 DIAGNOSIS — R531 Weakness: Secondary | ICD-10-CM | POA: Diagnosis not present

## 2019-04-01 DIAGNOSIS — R05 Cough: Secondary | ICD-10-CM | POA: Diagnosis not present

## 2019-04-01 DIAGNOSIS — R197 Diarrhea, unspecified: Secondary | ICD-10-CM | POA: Diagnosis not present

## 2019-04-01 DIAGNOSIS — C3492 Malignant neoplasm of unspecified part of left bronchus or lung: Secondary | ICD-10-CM

## 2019-04-01 DIAGNOSIS — F1290 Cannabis use, unspecified, uncomplicated: Secondary | ICD-10-CM | POA: Diagnosis not present

## 2019-04-01 DIAGNOSIS — Z510 Encounter for antineoplastic radiation therapy: Secondary | ICD-10-CM | POA: Diagnosis not present

## 2019-04-01 DIAGNOSIS — Z9221 Personal history of antineoplastic chemotherapy: Secondary | ICD-10-CM | POA: Diagnosis not present

## 2019-04-01 DIAGNOSIS — Z5111 Encounter for antineoplastic chemotherapy: Secondary | ICD-10-CM | POA: Diagnosis present

## 2019-04-01 DIAGNOSIS — C3412 Malignant neoplasm of upper lobe, left bronchus or lung: Secondary | ICD-10-CM | POA: Diagnosis present

## 2019-04-01 DIAGNOSIS — Z809 Family history of malignant neoplasm, unspecified: Secondary | ICD-10-CM | POA: Diagnosis not present

## 2019-04-01 DIAGNOSIS — Z8249 Family history of ischemic heart disease and other diseases of the circulatory system: Secondary | ICD-10-CM | POA: Diagnosis not present

## 2019-04-01 DIAGNOSIS — C7931 Secondary malignant neoplasm of brain: Secondary | ICD-10-CM | POA: Diagnosis not present

## 2019-04-01 DIAGNOSIS — R634 Abnormal weight loss: Secondary | ICD-10-CM | POA: Diagnosis not present

## 2019-04-01 DIAGNOSIS — Z923 Personal history of irradiation: Secondary | ICD-10-CM | POA: Diagnosis not present

## 2019-04-01 DIAGNOSIS — F1490 Cocaine use, unspecified, uncomplicated: Secondary | ICD-10-CM | POA: Diagnosis not present

## 2019-04-01 DIAGNOSIS — Z833 Family history of diabetes mellitus: Secondary | ICD-10-CM | POA: Diagnosis not present

## 2019-04-01 DIAGNOSIS — R112 Nausea with vomiting, unspecified: Secondary | ICD-10-CM | POA: Diagnosis not present

## 2019-04-01 LAB — CBC WITH DIFFERENTIAL (CANCER CENTER ONLY)
Abs Immature Granulocytes: 0.02 10*3/uL (ref 0.00–0.07)
Basophils Absolute: 0 10*3/uL (ref 0.0–0.1)
Basophils Relative: 0 %
Eosinophils Absolute: 0.1 10*3/uL (ref 0.0–0.5)
Eosinophils Relative: 2 %
HCT: 32.7 % — ABNORMAL LOW (ref 39.0–52.0)
Hemoglobin: 10.5 g/dL — ABNORMAL LOW (ref 13.0–17.0)
Immature Granulocytes: 1 %
Lymphocytes Relative: 14 %
Lymphs Abs: 0.4 10*3/uL — ABNORMAL LOW (ref 0.7–4.0)
MCH: 27.6 pg (ref 26.0–34.0)
MCHC: 32.1 g/dL (ref 30.0–36.0)
MCV: 85.8 fL (ref 80.0–100.0)
Monocytes Absolute: 0.4 10*3/uL (ref 0.1–1.0)
Monocytes Relative: 15 %
Neutro Abs: 2 10*3/uL (ref 1.7–7.7)
Neutrophils Relative %: 68 %
Platelet Count: 219 10*3/uL (ref 150–400)
RBC: 3.81 MIL/uL — ABNORMAL LOW (ref 4.22–5.81)
RDW: 17.6 % — ABNORMAL HIGH (ref 11.5–15.5)
WBC Count: 3 10*3/uL — ABNORMAL LOW (ref 4.0–10.5)
nRBC: 0 % (ref 0.0–0.2)

## 2019-04-01 LAB — CMP (CANCER CENTER ONLY)
ALT: 19 U/L (ref 0–44)
AST: 37 U/L (ref 15–41)
Albumin: 2.3 g/dL — ABNORMAL LOW (ref 3.5–5.0)
Alkaline Phosphatase: 80 U/L (ref 38–126)
Anion gap: 11 (ref 5–15)
BUN: 7 mg/dL (ref 6–20)
CO2: 24 mmol/L (ref 22–32)
Calcium: 8.9 mg/dL (ref 8.9–10.3)
Chloride: 98 mmol/L (ref 98–111)
Creatinine: 0.78 mg/dL (ref 0.61–1.24)
GFR, Est AFR Am: >60 mL/min (ref >60)
GFR, Estimated: >60 mL/min (ref >60)
Glucose, Bld: 108 mg/dL — ABNORMAL HIGH (ref 70–99)
Potassium: 3.8 mmol/L (ref 3.5–5.1)
Sodium: 133 mmol/L — ABNORMAL LOW (ref 135–145)
Total Bilirubin: 0.4 mg/dL (ref 0.3–1.2)
Total Protein: 7.5 g/dL (ref 6.5–8.1)
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
## 2019-04-02 ENCOUNTER — Inpatient Hospital Stay: Payer: Medicaid Other

## 2019-04-02 ENCOUNTER — Ambulatory Visit: Payer: Medicaid Other

## 2019-04-02 ENCOUNTER — Other Ambulatory Visit: Payer: Self-pay

## 2019-04-02 ENCOUNTER — Telehealth: Payer: Self-pay | Admitting: Hematology and Oncology

## 2019-04-02 VITALS — BP 103/59 | HR 96 | Temp 99.1°F | Resp 16

## 2019-04-02 DIAGNOSIS — Z510 Encounter for antineoplastic radiation therapy: Secondary | ICD-10-CM | POA: Diagnosis not present

## 2019-04-02 DIAGNOSIS — Z5111 Encounter for antineoplastic chemotherapy: Secondary | ICD-10-CM | POA: Diagnosis not present

## 2019-04-02 DIAGNOSIS — C3490 Malignant neoplasm of unspecified part of unspecified bronchus or lung: Secondary | ICD-10-CM

## 2019-04-02 MED ORDER — PALONOSETRON HCL INJECTION 0.25 MG/5ML
INTRAVENOUS | Status: AC
  Filled 2019-04-02: qty 5

## 2019-04-02 MED ORDER — SODIUM CHLORIDE 0.9 % IV SOLN
20.0000 mg | Freq: Once | INTRAVENOUS | Status: AC
  Administered 2019-04-02: 13:00:00 20 mg via INTRAVENOUS
  Filled 2019-04-02: qty 20

## 2019-04-02 MED ORDER — DIPHENHYDRAMINE HCL 50 MG/ML IJ SOLN
INTRAMUSCULAR | Status: AC
  Filled 2019-04-02: qty 1

## 2019-04-02 MED ORDER — SODIUM CHLORIDE 0.9 % IV SOLN
258.4000 mg | Freq: Once | INTRAVENOUS | Status: AC
  Administered 2019-04-02: 15:00:00 260 mg via INTRAVENOUS
  Filled 2019-04-02: qty 26

## 2019-04-02 MED ORDER — PALONOSETRON HCL INJECTION 0.25 MG/5ML
0.2500 mg | Freq: Once | INTRAVENOUS | Status: AC
  Administered 2019-04-02: 12:00:00 0.25 mg via INTRAVENOUS

## 2019-04-02 MED ORDER — FAMOTIDINE IN NACL 20-0.9 MG/50ML-% IV SOLN
INTRAVENOUS | Status: AC
  Filled 2019-04-02: qty 50

## 2019-04-02 MED ORDER — DIPHENHYDRAMINE HCL 50 MG/ML IJ SOLN
50.0000 mg | Freq: Once | INTRAMUSCULAR | Status: AC
  Administered 2019-04-02: 12:00:00 50 mg via INTRAVENOUS

## 2019-04-02 MED ORDER — SODIUM CHLORIDE 0.9 % IV SOLN
Freq: Once | INTRAVENOUS | Status: AC
  Filled 2019-04-02: qty 250

## 2019-04-02 MED ORDER — HEPARIN SOD (PORK) LOCK FLUSH 100 UNIT/ML IV SOLN
500.0000 [IU] | Freq: Once | INTRAVENOUS | Status: AC | PRN
  Administered 2019-04-02: 15:00:00 500 [IU]
  Filled 2019-04-02: qty 5

## 2019-04-02 MED ORDER — SODIUM CHLORIDE 0.9 % IV SOLN
45.0000 mg/m2 | Freq: Once | INTRAVENOUS | Status: AC
  Administered 2019-04-02: 14:00:00 84 mg via INTRAVENOUS
  Filled 2019-04-02: qty 14

## 2019-04-02 MED ORDER — FAMOTIDINE IN NACL 20-0.9 MG/50ML-% IV SOLN
20.0000 mg | Freq: Once | INTRAVENOUS | Status: AC
  Administered 2019-04-02: 12:00:00 20 mg via INTRAVENOUS

## 2019-04-02 MED ORDER — SODIUM CHLORIDE 0.9% FLUSH
10.0000 mL | INTRAVENOUS | Status: DC | PRN
  Administered 2019-04-02: 15:00:00 10 mL
  Filled 2019-04-02: qty 10

## 2019-04-02 NOTE — Telephone Encounter (Signed)
Scheduled per los. Called, not able to leave msg. Mailed pirntout

## 2019-04-02 NOTE — Patient Instructions (Signed)
[facility] Discharge Instructions for Patients Receiving Chemotherapy  Today you received the following chemotherapy agents: Paclitaxel, Carboplatin  To help prevent nausea and vomiting after your treatment, we encourage you to take your nausea medication as directed by your MD.   If you develop nausea and vomiting that is not controlled by your nausea medication, call the clinic.   BELOW ARE SYMPTOMS THAT SHOULD BE REPORTED IMMEDIATELY:  *FEVER GREATER THAN 100.5 F  *CHILLS WITH OR WITHOUT FEVER  NAUSEA AND VOMITING THAT IS NOT CONTROLLED WITH YOUR NAUSEA MEDICATION  *UNUSUAL SHORTNESS OF BREATH  *UNUSUAL BRUISING OR BLEEDING  TENDERNESS IN MOUTH AND THROAT WITH OR WITHOUT PRESENCE OF ULCERS  *URINARY PROBLEMS  *BOWEL PROBLEMS  UNUSUAL RASH Items with * indicate a potential emergency and should be followed up as soon as possible.  Feel free to call the clinic should you have any questions or concerns. The clinic phone number is [phone].  Please show the [facility] at check-in to the Emergency Department and triage nurse.  Coronavirus (COVID-19) Are you at risk?  Are you at risk for the Coronavirus (COVID-19)?  To be considered HIGH RISK for Coronavirus (COVID-19), you have to meet the following criteria:  . Traveled to [place], [place], [place], [place] or [place]; or in the [place] to [place], [place], [place], or [place]; and have fever, cough, and shortness of breath within the last 2 weeks of travel OR . Been in close contact with a person diagnosed with COVID-19 within the last 2 weeks and have fever, cough, and shortness of breath . IF YOU DO NOT MEET THESE CRITERIA, YOU ARE CONSIDERED LOW RISK FOR COVID-19.  What to do if you are HIGH RISK for COVID-19?  Marland Kitchen If you are having a medical emergency, call 911. . Seek medical care right away. Before you go to a doctor's office, urgent care or emergency department, call ahead and  tell them about your recent travel, contact with someone diagnosed with COVID-19, and your symptoms. You should receive instructions from your physician's office regarding next steps of care.  . When you arrive at healthcare provider, tell the healthcare staff immediately you have returned from visiting [place], [place], [place], [place] or [place]; or traveled in the [place] to [place], [place], [place], or [place]; in the last two weeks or you have been in close contact with a person diagnosed with COVID-19 in the last 2 weeks.   . Tell the health care staff about your symptoms: fever, cough and shortness of breath. . After you have been seen by a medical provider, you will be either: o Tested for (COVID-19) and discharged home on quarantine except to seek medical care if symptoms worsen, and asked to  - Stay home and avoid contact with others until you get your results (4-5 days)  - Avoid travel on public transportation if possible (such as bus, train, or airplane) or o Sent to the Emergency Department by EMS for evaluation, COVID-19 testing, and possible admission depending on your condition and test results.  What to do if you are LOW RISK for COVID-19?  Reduce your risk of any infection by using the same precautions used for avoiding the common cold or flu:  Marland Kitchen Wash your hands often with soap and warm water for at least 20 seconds.  If soap and water are not readily available, use an alcohol-based hand sanitizer with at least 60% alcohol.  Marland Kitchen  If coughing or sneezing, cover your mouth and nose by coughing or sneezing into the elbow areas of your shirt or coat, into a tissue or into your sleeve (not your hands). . Avoid shaking hands with others and consider head nods or verbal greetings only. . Avoid touching your eyes, nose, or mouth with unwashed hands.  . Avoid close contact with people who are sick. . Avoid places or events with large numbers of people in one location, like  concerts or sporting events. . Carefully consider travel plans you have or are making. . If you are planning any travel outside or inside the [place], visit the CDC's Travelers' Health webpage for the latest health notices. . If you have some symptoms but not all symptoms, continue to monitor at home and seek medical attention if your symptoms worsen. . If you are having a medical emergency, call 911.   [place] / e-Visit: [email or web address]         [facility]: [facility]: [phone]                   [facility]: [phone]

## 2019-04-05 ENCOUNTER — Ambulatory Visit
Admission: RE | Admit: 2019-04-05 | Discharge: 2019-04-05 | Disposition: A | Discharge status: Home / Self Care | Payer: Medicaid Other | Source: Ambulatory Visit | Attending: Radiation Oncology | Admitting: Radiation Oncology

## 2019-04-05 ENCOUNTER — Ambulatory Visit: Payer: Medicaid Other

## 2019-04-05 ENCOUNTER — Other Ambulatory Visit: Payer: Self-pay

## 2019-04-05 DIAGNOSIS — Z510 Encounter for antineoplastic radiation therapy: Secondary | ICD-10-CM | POA: Diagnosis not present

## 2019-04-06 ENCOUNTER — Encounter: Payer: Self-pay | Admitting: Radiation Oncology

## 2019-04-06 ENCOUNTER — Ambulatory Visit
Admission: RE | Admit: 2019-04-06 | Discharge: 2019-04-06 | Disposition: A | Discharge status: Home / Self Care | Payer: Medicaid Other | Source: Ambulatory Visit | Attending: Radiation Oncology | Admitting: Radiation Oncology

## 2019-04-06 ENCOUNTER — Other Ambulatory Visit: Payer: Self-pay

## 2019-04-06 DIAGNOSIS — Z510 Encounter for antineoplastic radiation therapy: Secondary | ICD-10-CM | POA: Diagnosis not present

## 2019-04-07 ENCOUNTER — Telehealth: Payer: Self-pay | Admitting: *Deleted

## 2019-04-07 NOTE — Telephone Encounter (Signed)
Received vm message from patient's sister, Micheal Vincent, requesting a call back. TCT Micheal Vincent and spoke with her. She called to confirm upcoming appts for her brother and to confirm that he has completed his radiation treatments. Informed her that his last treatment was yesterday and his last chemo was on 04/02/19. Encouraged her to encouraged Kinan to keep well hydrated and to keep eating well to help get his strength back. Also advised about Brain MRI on 04/22/19 and f/u in Radiation onc on 04/28/19  Micheal Vincent voiced understanding.

## 2019-04-12 ENCOUNTER — Other Ambulatory Visit: Payer: Self-pay | Admitting: Hematology and Oncology

## 2019-04-12 ENCOUNTER — Telehealth: Payer: Self-pay | Admitting: *Deleted

## 2019-04-12 MED ORDER — HYDROCODONE-ACETAMINOPHEN 7.5-325 MG/15ML PO SOLN: 10.0000 mL | Freq: Four times a day (QID) | ORAL | 0 refills | Status: DC | PRN

## 2019-04-12 MED FILL — HYDROCOD-APAP 7.5-325/15ML: 7.5-325 | 4 days supply | Qty: 236 | Fill #0

## 2019-04-12 NOTE — Telephone Encounter (Signed)
Received vm message from pt's sister, Jeno Calleros. She states that Martrell's cough is not any better with the use of delsym.  He is not coughing up anything but has a persistent barking cough. Discussed with Dr. Lorenso Courier  He will order Hycet for his cough. Advised to call into [facility]. [name] shares that Shaheem has moved in with her now as it was not working out well at patient's daughter home. Address changed in EMR. Bryson Ha to call if this cough persists.

## 2019-04-13 ENCOUNTER — Emergency Department (HOSPITAL_COMMUNITY): Payer: Medicaid Other

## 2019-04-13 ENCOUNTER — Encounter (HOSPITAL_COMMUNITY): Payer: Self-pay | Admitting: Emergency Medicine

## 2019-04-13 ENCOUNTER — Other Ambulatory Visit: Payer: Self-pay | Admitting: *Deleted

## 2019-04-13 ENCOUNTER — Other Ambulatory Visit: Payer: Self-pay

## 2019-04-13 ENCOUNTER — Inpatient Hospital Stay (HOSPITAL_COMMUNITY)
Admission: EM | Admit: 2019-04-13 | Discharge: 2019-04-16 | DRG: 193 | Disposition: A | Discharge status: Home Health | Payer: Medicaid Other | Attending: Internal Medicine | Admitting: Internal Medicine

## 2019-04-13 DIAGNOSIS — E43 Unspecified severe protein-calorie malnutrition: Secondary | ICD-10-CM | POA: Diagnosis present

## 2019-04-13 DIAGNOSIS — Z8249 Family history of ischemic heart disease and other diseases of the circulatory system: Secondary | ICD-10-CM | POA: Diagnosis not present

## 2019-04-13 DIAGNOSIS — Z7982 Long term (current) use of aspirin: Secondary | ICD-10-CM | POA: Diagnosis not present

## 2019-04-13 DIAGNOSIS — D6481 Anemia due to antineoplastic chemotherapy: Secondary | ICD-10-CM | POA: Diagnosis present

## 2019-04-13 DIAGNOSIS — T451X5A Adverse effect of antineoplastic and immunosuppressive drugs, initial encounter: Secondary | ICD-10-CM | POA: Diagnosis present

## 2019-04-13 DIAGNOSIS — G936 Cerebral edema: Secondary | ICD-10-CM

## 2019-04-13 DIAGNOSIS — D72819 Decreased white blood cell count, unspecified: Secondary | ICD-10-CM | POA: Diagnosis present

## 2019-04-13 DIAGNOSIS — D630 Anemia in neoplastic disease: Secondary | ICD-10-CM | POA: Diagnosis present

## 2019-04-13 DIAGNOSIS — F17210 Nicotine dependence, cigarettes, uncomplicated: Secondary | ICD-10-CM | POA: Diagnosis present

## 2019-04-13 DIAGNOSIS — X58XXXA Exposure to other specified factors, initial encounter: Secondary | ICD-10-CM | POA: Diagnosis present

## 2019-04-13 DIAGNOSIS — Z9114 Patient's other noncompliance with medication regimen: Secondary | ICD-10-CM

## 2019-04-13 DIAGNOSIS — C7931 Secondary malignant neoplasm of brain: Secondary | ICD-10-CM | POA: Diagnosis present

## 2019-04-13 DIAGNOSIS — Z681 Body mass index (BMI) 19 or less, adult: Secondary | ICD-10-CM | POA: Diagnosis not present

## 2019-04-13 DIAGNOSIS — R509 Fever, unspecified: Secondary | ICD-10-CM | POA: Diagnosis present

## 2019-04-13 DIAGNOSIS — Z791 Long term (current) use of non-steroidal anti-inflammatories (NSAID): Secondary | ICD-10-CM | POA: Diagnosis not present

## 2019-04-13 DIAGNOSIS — Z20822 Contact with and (suspected) exposure to covid-19: Secondary | ICD-10-CM | POA: Diagnosis present

## 2019-04-13 DIAGNOSIS — Z79899 Other long term (current) drug therapy: Secondary | ICD-10-CM

## 2019-04-13 DIAGNOSIS — Z79891 Long term (current) use of opiate analgesic: Secondary | ICD-10-CM | POA: Diagnosis not present

## 2019-04-13 DIAGNOSIS — J189 Pneumonia, unspecified organism: Secondary | ICD-10-CM | POA: Diagnosis not present

## 2019-04-13 DIAGNOSIS — Z833 Family history of diabetes mellitus: Secondary | ICD-10-CM

## 2019-04-13 DIAGNOSIS — C3492 Malignant neoplasm of unspecified part of left bronchus or lung: Secondary | ICD-10-CM | POA: Diagnosis present

## 2019-04-13 LAB — CBC WITH DIFFERENTIAL/PLATELET
Abs Immature Granulocytes: 0.03 10*3/uL (ref 0.00–0.07)
Basophils Absolute: 0 10*3/uL (ref 0.0–0.1)
Basophils Relative: 0 %
Eosinophils Absolute: 0 10*3/uL (ref 0.0–0.5)
Eosinophils Relative: 0 %
HCT: 31.4 % — ABNORMAL LOW (ref 39.0–52.0)
Hemoglobin: 9.9 g/dL — ABNORMAL LOW (ref 13.0–17.0)
Immature Granulocytes: 1 %
Lymphocytes Relative: 9 %
Lymphs Abs: 0.4 10*3/uL — ABNORMAL LOW (ref 0.7–4.0)
MCH: 27.4 pg (ref 26.0–34.0)
MCHC: 31.5 g/dL (ref 30.0–36.0)
MCV: 87 fL (ref 80.0–100.0)
Monocytes Absolute: 0.7 10*3/uL (ref 0.1–1.0)
Monocytes Relative: 15 %
Neutro Abs: 3.2 10*3/uL (ref 1.7–7.7)
Neutrophils Relative %: 75 %
Platelets: 329 10*3/uL (ref 150–400)
RBC: 3.61 MIL/uL — ABNORMAL LOW (ref 4.22–5.81)
RDW: 18 % — ABNORMAL HIGH (ref 11.5–15.5)
WBC: 4.3 10*3/uL (ref 4.0–10.5)
nRBC: 0 % (ref 0.0–0.2)

## 2019-04-13 LAB — COMPREHENSIVE METABOLIC PANEL
ALT: 25 U/L (ref 0–44)
AST: 26 U/L (ref 15–41)
Albumin: 2.6 g/dL — ABNORMAL LOW (ref 3.5–5.0)
Alkaline Phosphatase: 66 U/L (ref 38–126)
Anion gap: 11 (ref 5–15)
BUN: 6 mg/dL (ref 6–20)
CO2: 24 mmol/L (ref 22–32)
Calcium: 8.9 mg/dL (ref 8.9–10.3)
Chloride: 99 mmol/L (ref 98–111)
Creatinine, Ser: 0.81 mg/dL (ref 0.61–1.24)
GFR calc Af Amer: >60 mL/min (ref >60)
GFR calc non Af Amer: >60 mL/min (ref >60)
Glucose, Bld: 94 mg/dL (ref 70–99)
Potassium: 4.1 mmol/L (ref 3.5–5.1)
Sodium: 134 mmol/L — ABNORMAL LOW (ref 135–145)
Total Bilirubin: 0.7 mg/dL (ref 0.3–1.2)
Total Protein: 6.9 g/dL (ref 6.5–8.1)

## 2019-04-13 LAB — URINALYSIS, ROUTINE W REFLEX MICROSCOPIC
Bilirubin Urine: NEGATIVE
Glucose, UA: NEGATIVE mg/dL
Hgb urine dipstick: NEGATIVE
Ketones, ur: NEGATIVE mg/dL
Leukocytes,Ua: NEGATIVE
Nitrite: NEGATIVE
Protein, ur: 30 mg/dL — AB
Specific Gravity, Urine: 1.021 (ref 1.005–1.030)
pH: 6 (ref 5.0–8.0)

## 2019-04-13 LAB — POC SARS CORONAVIRUS 2 AG -  ED: SARS Coronavirus 2 Ag: NEGATIVE

## 2019-04-13 LAB — LACTIC ACID, PLASMA: Lactic Acid, Venous: 1 mmol/L (ref 0.5–1.9)

## 2019-04-13 MED ORDER — SODIUM CHLORIDE 0.9 % IV BOLUS
1000.0000 mL | Freq: Once | INTRAVENOUS | Status: AC
  Administered 2019-04-13: 1000 mL via INTRAVENOUS

## 2019-04-13 NOTE — ED Notes (Signed)
Patient refused to let me collect labs he wants gthem collected from his port

## 2019-04-13 NOTE — ED Triage Notes (Signed)
Per pt, states he has been running a fever-complaining of a headache-history of cancer
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
## 2019-04-13 NOTE — ED Notes (Signed)
Micheal Vincent - [phone] Please call Sister when Labs are back and Pt is up for discharge.

## 2019-04-14 ENCOUNTER — Inpatient Hospital Stay (HOSPITAL_COMMUNITY): Payer: Medicaid Other

## 2019-04-14 ENCOUNTER — Encounter (HOSPITAL_COMMUNITY): Payer: Self-pay | Admitting: Internal Medicine

## 2019-04-14 ENCOUNTER — Ambulatory Visit: Payer: Self-pay | Admitting: Urology

## 2019-04-14 DIAGNOSIS — G936 Cerebral edema: Secondary | ICD-10-CM

## 2019-04-14 DIAGNOSIS — R509 Fever, unspecified: Secondary | ICD-10-CM

## 2019-04-14 LAB — BASIC METABOLIC PANEL
Anion gap: 10 (ref 5–15)
BUN: 7 mg/dL (ref 6–20)
CO2: 24 mmol/L (ref 22–32)
Calcium: 8.7 mg/dL — ABNORMAL LOW (ref 8.9–10.3)
Chloride: 101 mmol/L (ref 98–111)
Creatinine, Ser: 0.61 mg/dL (ref 0.61–1.24)
GFR calc Af Amer: >60 mL/min (ref >60)
GFR calc non Af Amer: >60 mL/min (ref >60)
Glucose, Bld: 101 mg/dL — ABNORMAL HIGH (ref 70–99)
Potassium: 4.1 mmol/L (ref 3.5–5.1)
Sodium: 135 mmol/L (ref 135–145)

## 2019-04-14 LAB — CBC
HCT: 28.6 % — ABNORMAL LOW (ref 39.0–52.0)
Hemoglobin: 9 g/dL — ABNORMAL LOW (ref 13.0–17.0)
MCH: 27.6 pg (ref 26.0–34.0)
MCHC: 31.5 g/dL (ref 30.0–36.0)
MCV: 87.7 fL (ref 80.0–100.0)
Platelets: 321 10*3/uL (ref 150–400)
RBC: 3.26 MIL/uL — ABNORMAL LOW (ref 4.22–5.81)
RDW: 18.2 % — ABNORMAL HIGH (ref 11.5–15.5)
WBC: 2.9 10*3/uL — ABNORMAL LOW (ref 4.0–10.5)
nRBC: 0 % (ref 0.0–0.2)

## 2019-04-14 LAB — SARS CORONAVIRUS 2 (TAT 6-24 HRS): SARS Coronavirus 2: NEGATIVE

## 2019-04-14 LAB — PROCALCITONIN
Procalcitonin: 0.11 ng/mL
Procalcitonin: 0.11 ng/mL

## 2019-04-14 MED ORDER — SODIUM CHLORIDE 0.9 % IV SOLN
3.0000 g | Freq: Four times a day (QID) | INTRAVENOUS | Status: DC
  Administered 2019-04-14 – 2019-04-16 (×9): 3 g via INTRAVENOUS
  Filled 2019-04-14 (×3): qty 8
  Filled 2019-04-14 (×3): qty 3
  Filled 2019-04-14: qty 8
  Filled 2019-04-14: qty 3
  Filled 2019-04-14 (×2): qty 8

## 2019-04-14 MED ORDER — SODIUM CHLORIDE 0.9 % IV SOLN: INTRAVENOUS | Status: AC

## 2019-04-14 MED ORDER — DEXAMETHASONE 4 MG PO TABS
4.0000 mg | ORAL_TABLET | Freq: Three times a day (TID) | ORAL | Status: DC
  Administered 2019-04-14 – 2019-04-16 (×8): 4 mg via ORAL
  Filled 2019-04-14 (×8): qty 1

## 2019-04-14 MED ORDER — IPRATROPIUM-ALBUTEROL 0.5-2.5 (3) MG/3ML IN SOLN: 3.0000 mL | Freq: Four times a day (QID) | RESPIRATORY_TRACT | Status: DC | PRN

## 2019-04-14 MED ORDER — ONDANSETRON HCL 4 MG PO TABS: 8.0000 mg | ORAL_TABLET | Freq: Three times a day (TID) | ORAL | Status: DC | PRN

## 2019-04-14 MED ORDER — THIAMINE HCL 100 MG PO TABS
100.0000 mg | ORAL_TABLET | Freq: Every day | ORAL | Status: DC
  Administered 2019-04-14 – 2019-04-16 (×3): 100 mg via ORAL
  Filled 2019-04-14 (×3): qty 1

## 2019-04-14 MED ORDER — LOPERAMIDE HCL 2 MG PO CAPS: 2.0000 mg | ORAL_CAPSULE | ORAL | Status: DC | PRN

## 2019-04-14 MED ORDER — FOLIC ACID 1 MG PO TABS
1.0000 mg | ORAL_TABLET | Freq: Every day | ORAL | Status: DC
  Administered 2019-04-14 – 2019-04-16 (×3): 1 mg via ORAL
  Filled 2019-04-14 (×3): qty 1

## 2019-04-14 MED ORDER — GADOBUTROL 1 MMOL/ML IV SOLN
6.0000 mL | Freq: Once | INTRAVENOUS | Status: AC | PRN
  Administered 2019-04-14: 6 mL via INTRAVENOUS

## 2019-04-14 MED ORDER — MIRTAZAPINE 15 MG PO TABS
7.5000 mg | ORAL_TABLET | Freq: Every day | ORAL | Status: DC
  Administered 2019-04-14 – 2019-04-15 (×3): 7.5 mg via ORAL
  Filled 2019-04-14 (×3): qty 1

## 2019-04-14 MED ORDER — HYDROCODONE-ACETAMINOPHEN 7.5-325 MG/15ML PO SOLN
10.0000 mL | Freq: Four times a day (QID) | ORAL | Status: DC | PRN
  Administered 2019-04-14 – 2019-04-16 (×5): 10 mL via ORAL
  Filled 2019-04-14 (×5): qty 15

## 2019-04-14 MED ORDER — PROCHLORPERAZINE MALEATE 10 MG PO TABS: 10.0000 mg | ORAL_TABLET | Freq: Four times a day (QID) | ORAL | Status: DC | PRN

## 2019-04-14 MED ORDER — CHLORHEXIDINE GLUCONATE CLOTH 2 % EX PADS
6.0000 | MEDICATED_PAD | Freq: Every day | CUTANEOUS | Status: DC
  Administered 2019-04-14 – 2019-04-16 (×3): 6 via TOPICAL

## 2019-04-14 MED ORDER — HEPARIN SODIUM (PORCINE) 5000 UNIT/ML IJ SOLN
5000.0000 [IU] | Freq: Two times a day (BID) | INTRAMUSCULAR | Status: DC
  Administered 2019-04-15 – 2019-04-16 (×3): 5000 [IU] via SUBCUTANEOUS
  Filled 2019-04-14 (×3): qty 1

## 2019-04-14 MED ORDER — ALBUTEROL SULFATE HFA 108 (90 BASE) MCG/ACT IN AERS: 2.0000 | INHALATION_SPRAY | Freq: Four times a day (QID) | RESPIRATORY_TRACT | Status: DC | PRN

## 2019-04-14 MED ORDER — FAMOTIDINE 20 MG PO TABS
20.0000 mg | ORAL_TABLET | Freq: Two times a day (BID) | ORAL | Status: DC
  Administered 2019-04-14 – 2019-04-16 (×6): 20 mg via ORAL
  Filled 2019-04-14 (×6): qty 1

## 2019-04-14 MED ORDER — BOOST / RESOURCE BREEZE PO LIQD CUSTOM
1.0000 | Freq: Three times a day (TID) | ORAL | Status: DC
  Administered 2019-04-14 – 2019-04-16 (×8): 1 via ORAL

## 2019-04-14 MED ORDER — ACETAMINOPHEN 325 MG PO TABS: 650.0000 mg | ORAL_TABLET | Freq: Four times a day (QID) | ORAL | Status: DC | PRN

## 2019-04-14 NOTE — Progress Notes (Signed)
PROGRESS NOTE    Micheal Vincent  XTK:[id number] DOB: Feb 15, 1961 DOA: 04/13/2019 PCP: Patient, No Pcp Per   Brief Narrative: 59 year old with PMH significant for Metastatic NSCLC ( brain mets) tobacco use who presents with fevers, cough and generalized lethargy and weakness.  Overall he continued to fail weak and unwell, poor oral intake.  He report remarkable weight loss of 40 to 50 pounds over the last month.  He reports chronic poor taste.  His family checked his temperature and he was found to be febrile.   Assessment & Plan:   Active Problems:   Fever of unknown origin   Fever  1-Pneumonia, probably post obstructive;  Covid 19 negative  Follow Blood cultures.  Chest x  Ray; left upper lobe cavitary mass has become more radiolucent.  I will start Unasyn to cover for postobstructive pneumonia.   2-Metastatic non-small cell lung cancer: Patient now with concern for new progression of brain mets on CT head. CT head with multiple new areas of vasogenic edema in bilateral cerebral hemisphere, most pronounced in the right frontal lobe, consistent with progression of metastatic disease MRI of the brain confirmed 7 new metastatic lesions. Continue with dexamethasone 4 mg 3 times daily. Dr. Lorenso Courier consulted.  He will also inform Dr. Tammi Klippel with radiation oncology.  Leukopenia: Follow trend.  Anemia, malignancy. Hemoglobin stable  Estimated body mass index is 19.79 kg/m as calculated from the following:   Height as of this encounter: 6' (1.829 m).   Weight as of this encounter: 66.2 kg.   DVT prophylaxis: Heparin Code Status: Full code Family Communication: Care discussed with patient Disposition Plan:  Patient is from: Home Anticipated d/c date: 2 to 3 days Barriers to d/c or necessity for inpatient status: Remain in the hospital for treatment of pneumonia requiring IV antibiotics, also further evaluation for new multiple brain mets  Consultants:   Oncology  Procedures:     None  Antimicrobials:  Unasyn  Subjective: He is alert and conversant, he report feeling a little bit better today.  Still feeling weak and tired.  He report productive cough.  Objective: Vitals:   04/14/19 0030 04/14/19 0125 04/14/19 0127 04/14/19 0650  BP: 107/78  109/78 127/83  Pulse: 91  92 83  Resp: 16  18 16   Temp:   99.2 F (37.3 C) 97.7 F (36.5 C)  TempSrc:   Oral Oral  SpO2: 97%  97% 99%  Weight:  66.2 kg    Height:  6' (1.829 m)      Intake/Output Summary (Last 24 hours) at 04/14/2019 1507 Last data filed at 04/14/2019 0700 Gross per 24 hour  Intake 760 ml  Output 400 ml  Net 360 ml   Filed Weights   04/13/19 1841 04/14/19 0125  Weight: 63.5 kg 66.2 kg    Examination:  General exam: Appears calm and comfortable  Respiratory system: Bilateral rhonchorous. Respiratory effort normal. Cardiovascular system: S1 & S2 heard, RRR. No JVD, murmurs, rubs, gallops or clicks. No pedal edema. Gastrointestinal system: Abdomen is nondistended, soft and nontender. No organomegaly or masses felt. Normal bowel sounds heard. Central nervous system: Alert and oriented.  Extremities: Symmetric 5 x 5 power. Skin: No rashes, lesions or ulcers   Data Reviewed: I have personally reviewed following labs and imaging studies  CBC: Recent Labs  Lab 04/13/19 1837 04/14/19 0315  WBC 4.3 2.9*  NEUTROABS 3.2  --   HGB 9.9* 9.0*  HCT 31.4* 28.6*  MCV 87.0 87.7  PLT  329 619   Basic Metabolic Panel: Recent Labs  Lab 04/13/19 1837 04/14/19 0315  NA 134* 135  K 4.1 4.1  CL 99 101  CO2 24 24  GLUCOSE 94 101*  BUN 6 7  CREATININE 0.81 0.61  CALCIUM 8.9 8.7*   GFR: Estimated Creatinine Clearance: 94.2 mL/min (by C-G formula based on SCr of 0.61 mg/dL). Liver Function Tests: Recent Labs  Lab 04/13/19 1837  AST 26  ALT 25  ALKPHOS 66  BILITOT 0.7  PROT 6.9  ALBUMIN 2.6*   No results for input(s): LIPASE, AMYLASE in the last 168 hours. No results for input(s):  AMMONIA in the last 168 hours. Coagulation Profile: No results for input(s): INR, PROTIME in the last 168 hours. Cardiac Enzymes: No results for input(s): CKTOTAL, CKMB, CKMBINDEX, TROPONINI in the last 168 hours. BNP (last 3 results) No results for input(s): PROBNP in the last 8760 hours. HbA1C: No results for input(s): HGBA1C in the last 72 hours. CBG: No results for input(s): GLUCAP in the last 168 hours. Lipid Profile: No results for input(s): CHOL, HDL, LDLCALC, TRIG, CHOLHDL, LDLDIRECT in the last 72 hours. Thyroid Function Tests: No results for input(s): TSH, T4TOTAL, FREET4, T3FREE, THYROIDAB in the last 72 hours. Anemia Panel: No results for input(s): VITAMINB12, FOLATE, FERRITIN, TIBC, IRON, RETICCTPCT in the last 72 hours. Sepsis Labs: Recent Labs  Lab 04/13/19 1837 04/13/19 1838 04/13/19 2357  PROCALCITON 0.11  --  0.11  LATICACIDVEN  --  1.0  --     Recent Results (from the past 240 hour(s))  SARS CORONAVIRUS 2 (TAT 6-24 HRS) Nasopharyngeal Nasopharyngeal Swab     Status: None   Collection Time: 04/13/19 11:03 PM   Specimen: Nasopharyngeal Swab  Result Value Ref Range Status   SARS Coronavirus 2 NEGATIVE NEGATIVE Final    Comment: (NOTE) SARS-CoV-2 target nucleic acids are NOT DETECTED. The SARS-CoV-2 RNA is generally detectable in upper and lower respiratory specimens during the acute phase of infection. Negative results do not preclude SARS-CoV-2 infection, do not rule out co-infections with other pathogens, and should not be used as the sole basis for treatment or other patient management decisions. Negative results must be combined with clinical observations, patient history, and epidemiological information. The expected result is Negative. Fact Sheet for Patients: SugarRoll.be Fact Sheet for Healthcare Providers: https://www.woods-mathews.com/ This test is not yet approved or cleared by the [place] FDA and    has been authorized for detection and/or diagnosis of SARS-CoV-2 by FDA under an Emergency Use Authorization (EUA). This EUA will remain  in effect (meaning this test can be used) for the duration of the COVID-19 declaration under Section 56 4(b)(1) of the Act, 21 U.S.C. section 360bbb-3(b)(1), unless the authorization is terminated or revoked sooner. Performed at [facility], [place] [address] [place], [place] [address]   Culture, blood (routine x 2)     Status: None (Preliminary result)   Collection Time: 04/13/19 11:29 PM   Specimen: BLOOD  Result Value Ref Range Status   Specimen Description BLOOD SITE NOT SPECIFIED  Final   Special Requests   Final    BOTTLES DRAWN AEROBIC AND ANAEROBIC Blood Culture adequate volume Performed at [place] [address] [place], [place] [address]    Culture PENDING  Incomplete   Report Status PENDING  Incomplete         Radiology Studies: DG Chest 2 View  Result Date: 04/13/2019 CLINICAL DATA:  Fever headache EXAM: CHEST -  2 VIEW COMPARISON:  02/13/2019, PET CT 01/29/2019 FINDINGS: Right-sided central venous port tip over the SVC. Left upper lobe cavitary lung mass appears more lucent in the interval. Punctate nodularity over the right lower chest possible nipple shadow. Normal heart size. No pneumothorax. IMPRESSION: 1. Left upper lobe cavitary mass has become more radiolucent in the interval. No acute pulmonary infiltrates are seen. 2. Small nodule versus nipple shadow over the right lower lung, repeat image with nipple marker could be obtained. Electronically Signed   By: Donavan Foil M.D.   On: 04/13/2019 17:52   CT Head Wo Contrast  Result Date: 04/13/2019 CLINICAL DATA:  Left lung cancer, right cerebellar metastatic disease EXAM: CT HEAD WITHOUT CONTRAST TECHNIQUE: Contiguous axial images were obtained from the base of the skull through the vertex without intravenous contrast. COMPARISON:  01/12/2019,  01/13/2019 FINDINGS: Brain: Edema within the right cerebellar hemisphere seen previously is slightly diminished, with decreased mass effect on the vermis and fourth ventricle, compatible with known metastatic disease. Since the prior exam, new areas of vasogenic edema have developed within the right frontal, left frontal, right temporal, and left occipital lobes worrisome for new intracranial metastases. MRI may be useful for further evaluation. The largest area of edema within the right frontal lobe results in mild effacement of the right lateral ventricle without significant midline shift or herniation. No signs of acute infarct or hemorrhage. Remaining midline structures are unremarkable. There are no acute extra-axial fluid collections. Vascular: No hyperdense vessel or unexpected calcification. Skull: Normal. Negative for fracture or focal lesion. Sinuses/Orbits: No acute finding. Other: None IMPRESSION: 1. Persistent edema within the right cerebellar hemisphere consistent with known intracranial metastasis. 2. Multiple new areas of vasogenic edema in the bilateral cerebral hemispheres, most pronounced in the right frontal lobe, consistent with progression of metastatic disease. MRI may be useful for further evaluation. 3. No evidence of acute infarct or hemorrhage. Electronically Signed   By: Randa Ngo M.D.   On: 04/13/2019 19:56   MR BRAIN W WO CONTRAST  Result Date: 04/14/2019 CLINICAL DATA:  Metastatic lung cancer EXAM: MRI HEAD WITHOUT AND WITH CONTRAST TECHNIQUE: Multiplanar, multiecho pulse sequences of the brain and surrounding structures were obtained without and with intravenous contrast. CONTRAST:  35mL GADAVIST GADOBUTROL 1 MMOL/ML IV SOLN COMPARISON:  01/18/2019 FINDINGS: Brain: There are multiple new metastatic lesions. There is edema associated with the larger lesions causing mild regional mass effect. Right frontal measuring 1.7 x 1.7 cm; Parasagittal left frontal measuring 5 mm (series  16, image 126); Left middle frontal gyrus measuring 3 mm (series 16, image 110); Medial left temporal measuring 9 mm; Left occipital measuring 8 mm; Left parietal punctate (series 16, image 80); Anterior right temporal 2 mm (series 16, image 39) Right cerebellar lesion on the prior study measures slightly smaller in size at 1.1 x 2.1 x 1 cm (previously 1.2 x 2.2 x 1.2 cm). Surrounding edema is similar. There is no acute infarction. Minimal leftward midline shift. No hydrocephalus. Foci of susceptibility hypointensity in the cerebellum is likely treatment related. Vascular: Major vessel flow voids at the skull base are preserved. Skull and upper cervical spine: Normal marrow signal is preserved. Sinuses/Orbits: Minor mucosal thickening.  Orbits are unremarkable. Other: Sella is unremarkable.  Mastoid air cells are clear. IMPRESSION: Seven new metastatic lesions with associated edema and mild regional mass effect. Slight decrease in size of right cerebellar lesion with similar edema. Electronically Signed   By: Macy Mis M.D.   On: 04/14/2019  12:59        Scheduled Meds:  Chlorhexidine Gluconate Cloth  6 each Topical Daily   dexamethasone  4 mg Oral TID   famotidine  20 mg Oral BID   feeding supplement  1 Container Oral TID BM   folic acid  1 mg Oral Daily   [START ON 04/15/2019] heparin  5,000 Units Subcutaneous Q12H   mirtazapine  7.5 mg Oral QHS   thiamine  100 mg Oral Daily   Continuous Infusions:  ampicillin-sulbactam (UNASYN) IV 3 g (04/14/19 1237)     LOS: 1 day    Time spent: 35 minutes.     Elmarie Shiley, MD [facility]   If 7PM-7AM, please contact night-coverage [email or web address]  04/14/2019, 3:07 PM

## 2019-04-14 NOTE — Progress Notes (Signed)
CHG bath complete

## 2019-04-14 NOTE — Progress Notes (Signed)
Verbal order to hold Tele monitoring during patient scheduled MRI

## 2019-04-14 NOTE — Progress Notes (Addendum)
Pharmacy Antibiotic Note  Micheal Vincent is a 59 y.o. male admitted on 04/13/2019 with hx of metastatic NSCLC who presents with fevers, cough and generalized lethargy/weakness. Pharmacy has been consulted for unasyn dosing for post obstructive pna.  Plan: unasyn 3gm IV q6h (Scr 0.61, CrCl ~ 67mls/min) Unlikely will need renal adjustment,  pharmacy will sign off and follow peripherally  Height: 6' (182.9 cm) Weight: 145 lb 14.4 oz (66.2 kg) IBW/kg (Calculated) : 77.6  Temp (24hrs), Avg:99.3 F (37.4 C), Min:97.7 F (36.5 C), Max:100.5 F (38.1 C)  Recent Labs  Lab 04/13/19 1837 04/13/19 1838 04/14/19 0315  WBC 4.3  --  2.9*  CREATININE 0.81  --  0.61  LATICACIDVEN  --  1.0  --     Estimated Creatinine Clearance: 94.2 mL/min (by C-G formula based on SCr of 0.61 mg/dL).    No Known Allergies   Thank you for allowing pharmacy to be a part of this patient's care.  Dolly Rias RPh 04/14/2019, 11:30 AM

## 2019-04-14 NOTE — Plan of Care (Signed)
Plan of care discussed.   

## 2019-04-15 ENCOUNTER — Encounter (HOSPITAL_COMMUNITY): Payer: Self-pay | Admitting: Internal Medicine

## 2019-04-15 DIAGNOSIS — E43 Unspecified severe protein-calorie malnutrition: Secondary | ICD-10-CM | POA: Insufficient documentation

## 2019-04-15 LAB — RETICULOCYTES
Immature Retic Fract: 34 % — ABNORMAL HIGH (ref 2.3–15.9)
RBC.: 3.59 MIL/uL — ABNORMAL LOW (ref 4.22–5.81)
Retic Count, Absolute: 55.3 10*3/uL (ref 19.0–186.0)
Retic Ct Pct: 1.5 % (ref 0.4–3.1)

## 2019-04-15 LAB — PROCALCITONIN: Procalcitonin: <0.1 ng/mL

## 2019-04-15 LAB — BASIC METABOLIC PANEL
Anion gap: 11 (ref 5–15)
BUN: 11 mg/dL (ref 6–20)
CO2: 24 mmol/L (ref 22–32)
Calcium: 8.7 mg/dL — ABNORMAL LOW (ref 8.9–10.3)
Chloride: 103 mmol/L (ref 98–111)
Creatinine, Ser: 0.69 mg/dL (ref 0.61–1.24)
GFR calc Af Amer: >60 mL/min (ref >60)
GFR calc non Af Amer: >60 mL/min (ref >60)
Glucose, Bld: 183 mg/dL — ABNORMAL HIGH (ref 70–99)
Potassium: 4.3 mmol/L (ref 3.5–5.1)
Sodium: 138 mmol/L (ref 135–145)

## 2019-04-15 LAB — CBC
HCT: 31 % — ABNORMAL LOW (ref 39.0–52.0)
Hemoglobin: 9.8 g/dL — ABNORMAL LOW (ref 13.0–17.0)
MCH: 27.8 pg (ref 26.0–34.0)
MCHC: 31.6 g/dL (ref 30.0–36.0)
MCV: 87.8 fL (ref 80.0–100.0)
Platelets: 388 10*3/uL (ref 150–400)
RBC: 3.53 MIL/uL — ABNORMAL LOW (ref 4.22–5.81)
RDW: 18.1 % — ABNORMAL HIGH (ref 11.5–15.5)
WBC: 5.4 10*3/uL (ref 4.0–10.5)
nRBC: 0 % (ref 0.0–0.2)

## 2019-04-15 LAB — FERRITIN: Ferritin: 901 ng/mL — ABNORMAL HIGH (ref 24–336)

## 2019-04-15 LAB — IRON AND TIBC
Iron: 55 ug/dL (ref 45–182)
Saturation Ratios: 28 % (ref 17.9–39.5)
TIBC: 199 ug/dL — ABNORMAL LOW (ref 250–450)
UIBC: 144 ug/dL

## 2019-04-15 LAB — VITAMIN B12: Vitamin B-12: 653 pg/mL (ref 180–914)

## 2019-04-15 LAB — FOLATE: Folate: 8.6 ng/mL (ref >5.9)

## 2019-04-15 MED ORDER — ZOLPIDEM TARTRATE 5 MG PO TABS
5.0000 mg | ORAL_TABLET | Freq: Once | ORAL | Status: AC
  Administered 2019-04-16: 5 mg via ORAL
  Filled 2019-04-15: qty 1

## 2019-04-15 MED ORDER — SODIUM CHLORIDE 0.9% FLUSH: 10.0000 mL | INTRAVENOUS | Status: DC | PRN

## 2019-04-15 MED ORDER — TRAZODONE HCL 50 MG PO TABS: 50.0000 mg | ORAL_TABLET | Freq: Once | ORAL | Status: DC

## 2019-04-15 MED ORDER — ENSURE ENLIVE PO LIQD
237.0000 mL | Freq: Two times a day (BID) | ORAL | Status: DC
  Administered 2019-04-16 (×2): 237 mL via ORAL

## 2019-04-15 NOTE — Progress Notes (Signed)
Initial Nutrition Assessment  DOCUMENTATION CODES:   Severe malnutrition in context of chronic illness  INTERVENTION:  Provide Ensure Enlive po BID, each supplement provides 350 kcal and 20 grams of protein (vanilla)  Provide Magic cup BID with lunch and dinner meals, each supplement provides 290 kcal and 9 grams of protein  Provide CIB daily on breakfast tray, each supplement with 237 ml whole milk provides 280 kcal and 13 grams of protein.  Double protein portions on dinner tray  Encourage po intake of meals and supplements  Education   Patient is at risk for refeeding, recommend monitoring magnesium, potassium, and phosphorus, MD to replete as needed  NUTRITION DIAGNOSIS:   Severe Malnutrition related to chronic illness(metastatic lung cancer) as evidenced by per patient/family report, energy intake < 75% for > or equal to 1 month, percent weight loss, severe fat depletion, severe muscle depletion.    GOAL:   Patient will meet greater than or equal to 90% of their needs   MONITOR:   PO intake, Supplement acceptance, Weight trends, Labs, I & O's  REASON FOR ASSESSMENT:   Malnutrition Screening Tool    ASSESSMENT:   59 year old male with past medical history significant of septic arthritis and NSCLC metastatic to brain presented to ED on 2/23 with fevers, cough, and generalized lethargy/weakness. Patient reports feeling unwell, inadequate po intake for the past 2 weeks, chronic poor taste, and significant weight loss over the past month  Patient admitted for fever of unknown origin.  2/12 - completed final chemotherapy treatment of Carboplatin/Paclitaxel.  Imaging: -CXR LUL cavity mass more radiolucent; probable postobstructive pneumonia -CT head with multiple new areas of vasogenic edema in bilateral cerebral hemisphere -MRI brain confirmed 7 new metastatic lesions  Very delightful patient sitting up in bed this morning at RD visit, reports feeling much better  today. He smiled and stated that his appetite is back, endorsed 100% of breakfast and was waiting to eat some lunch. Prior to admission, patient reports  ongoing decreased appetite and little po intake over the past couple of months. He stated that food had no flavor and meats just didn't taste right. Patient endorses drinking 1 Ensure daily and reports 7-8 left from the complimentary case of Ensure provided by outpatient oncology nutrition department.   RD encouraged patient to increase daily intake of supplement, educated on rinsing mouth with salt/baking soda solution prior to eating and offered additional protein sources that may be more palatable such as fish and eggs. NP entered patient room during visit, RD offered to return later in the day and bring patient recipe for oral rinse as well as additional education on maximizing calorie and protein intake.  Patient out of room upon RD return this afternoon. Educational handouts left on bedside tray for patient review. RD provided "Making the Most of Each Bite" "Taste and Smell Changes" and "High Protein High Calorie Recipes" handouts from the Academy of [employer] Dietetics.   Patient meets criteria for malnutrition in the context of chronic illness given severe weight loss, dietary recall meeting <75% of estimated needs > 1 month, and severe fat/muscle depletions noted on physical assessment. He is provided nutritional supplements to aid with estimated kcal/protein needs as well as education on ways to increase calories and protein when at home.   Current wt 145.64 lbs Per review of last 10 wt encounters, weights are down 36.74 lbs (20%) in the past 2 months which is severe for time frame. On 02/13/19 pt wt 82.9  kg (182.38 lbs), on 02/18/19 pt wt 79.8 kg 175.56 lbs), on 03/01/19 pt wt 75.4 kg (165.88 lbs), on 03/15/19 pt wt 73.2 kg (161.04 lbs) on 04/01/19 pt wt 67.7 kg (148.94 lbs)  Medications reviewed and include: Decadron, Pepcid, Folic acid,  Remeron, Thiamine Unasyn 3 g in NaCl IVPB  Labs: BG 183,101,94, Hgb 9.8 (L) trending up  NUTRITION - FOCUSED PHYSICAL EXAM: 2/25 Findings: Severe fat depletion to orbital, upper arm, buccal regions; Severe muscle depletion to temple, clavicle, and dorsal hand regions.   Diet Order:   Diet Order            Diet regular Room service appropriate? Yes; Fluid consistency: Thin  Diet effective now              EDUCATION NEEDS:   Education needs have been addressed  Skin:  Skin Assessment: Reviewed RN Assessment  Last BM:  2/23  Height:   Ht Readings from Last 1 Encounters:  04/14/19 6' (1.829 m)    Weight:   Wt Readings from Last 1 Encounters:  04/14/19 66.2 kg    Ideal Body Weight:  80.9 kg  BMI:  Body mass index is 19.79 kg/m.  Estimated Nutritional Needs:   Kcal:  2100-2300  Protein:  105-115  Fluid:  >/= 2.1 L/day    Lajuan Lines, RD, LDN Clinical Nutrition Office [phone] After Hours/Weekend Pager # in [facility]

## 2019-04-15 NOTE — TOC Progression Note (Signed)
Transition of Care [facility]) - Progression Note    Patient Details  Name: Micheal Vincent MRN: [id number] Date of Birth: Apr 04, 1960  [facility] CM/SW Contact  Purcell Mouton, RN Phone Number: 04/15/2019, 3:08 PM  Clinical Narrative:    TOC will continue to follow for discharge needs.       Expected Discharge Plan and Services                                                 Social Determinants of Health (SDOH) Interventions    Readmission Risk Interventions No flowsheet data found.

## 2019-04-15 NOTE — Progress Notes (Signed)
PROGRESS NOTE    Micheal Vincent  HER:[id number] DOB: 04-Jan-1961 DOA: 04/13/2019 PCP: Patient, No Pcp Per   Brief Narrative: 59 year old with PMH significant for Metastatic NSCLC ( brain mets) tobacco use who presents with fevers, cough and generalized lethargy and weakness.  Overall he continued to fail weak and unwell, poor oral intake.  He report remarkable weight loss of 40 to 50 pounds over the last month.  He reports chronic poor taste.  His family checked his temperature and he was found to be febrile.   Assessment & Plan:   Active Problems:   Fever of unknown origin   Fever  1-Pneumonia, probably post obstructive;  Covid 19 negative  Follow Blood cultures. No growth to date.  Chest x  Ray; left upper lobe cavitary mass has become more radiolucent.  Started  Unasyn to cover for postobstructive pneumonia. Afebrile now.    2-Metastatic non-small cell lung cancer: Patient now with concern for new progression of brain mets on CT head. CT head with multiple new areas of vasogenic edema in bilateral cerebral hemisphere, most pronounced in the right frontal lobe, consistent with progression of metastatic disease MRI of the brain confirmed 7 new metastatic lesions. Continue with dexamethasone 4 mg 3 times daily. Dr. Lorenso Courier consulted.  He will also inform Dr. Tammi Klippel with radiation oncology. Discussed with radiation oncology plan is for [facility] treatment to brain mets, they will arrange MRI out patient for same purpose.   Leukopenia: resolved  Anemia, malignancy. Hemoglobin stable Iron trial  Estimated body mass index is 19.79 kg/m as calculated from the following:   Height as of this encounter: 6' (1.829 m).   Weight as of this encounter: 66.2 kg.   DVT prophylaxis: Heparin Code Status: Full code Family Communication: Care discussed with patient Disposition Plan:  Patient is from: Home Anticipated d/c date: in 24 hours.  Barriers to d/c or necessity for inpatient status: plan  to discharge in 24 hours if stable. PT evaluation   Consultants:   Oncology  Procedures:   None  Antimicrobials:  Unasyn  Subjective: He reoprt feeling some what better, still feels weak. Cough improved  Objective: Vitals:   04/14/19 2034 04/14/19 2300 04/15/19 0609 04/15/19 1237  BP: 111/75  103/65 107/64  Pulse: 83  69 77  Resp: (!) 22 18 16 20   Temp: 98.5 F (36.9 C)  (!) 97.5 F (36.4 C) (!) 97.5 F (36.4 C)  TempSrc: Oral  Oral Oral  SpO2: 97%  100% 97%  Weight:      Height:        Intake/Output Summary (Last 24 hours) at 04/15/2019 1444 Last data filed at 04/15/2019 1339 Gross per 24 hour  Intake 809.22 ml  Output 1795 ml  Net -985.78 ml   Filed Weights   04/13/19 1841 04/14/19 0125  Weight: 63.5 kg 66.2 kg    Examination:  General exam: NAD Respiratory system: Bilateral [place] Cardiovascular system: S 1, S 2 RRR. Gastrointestinal system: BS present, soft, nt Central nervous system: Alert, folllow command Extremities: Symmetric power.  Skin: no rashes  Data Reviewed: I have personally reviewed following labs and imaging studies  CBC: Recent Labs  Lab 04/13/19 1837 04/14/19 0315 04/15/19 0245  WBC 4.3 2.9* 5.4  NEUTROABS 3.2  --   --   HGB 9.9* 9.0* 9.8*  HCT 31.4* 28.6* 31.0*  MCV 87.0 87.7 87.8  PLT 329 321 856   Basic Metabolic Panel: Recent Labs  Lab 04/13/19 1837 04/14/19 0315  04/15/19 0245  NA 134* 135 138  K 4.1 4.1 4.3  CL 99 101 103  CO2 24 24 24   GLUCOSE 94 101* 183*  BUN 6 7 11   CREATININE 0.81 0.61 0.69  CALCIUM 8.9 8.7* 8.7*   GFR: Estimated Creatinine Clearance: 94.2 mL/min (by C-G formula based on SCr of 0.69 mg/dL). Liver Function Tests: Recent Labs  Lab 04/13/19 1837  AST 26  ALT 25  ALKPHOS 66  BILITOT 0.7  PROT 6.9  ALBUMIN 2.6*   No results for input(s): LIPASE, AMYLASE in the last 168 hours. No results for input(s): AMMONIA in the last 168 hours. Coagulation Profile: No results for input(s):  INR, PROTIME in the last 168 hours. Cardiac Enzymes: No results for input(s): CKTOTAL, CKMB, CKMBINDEX, TROPONINI in the last 168 hours. BNP (last 3 results) No results for input(s): PROBNP in the last 8760 hours. HbA1C: No results for input(s): HGBA1C in the last 72 hours. CBG: No results for input(s): GLUCAP in the last 168 hours. Lipid Profile: No results for input(s): CHOL, HDL, LDLCALC, TRIG, CHOLHDL, LDLDIRECT in the last 72 hours. Thyroid Function Tests: No results for input(s): TSH, T4TOTAL, FREET4, T3FREE, THYROIDAB in the last 72 hours. Anemia Panel: Recent Labs    04/15/19 0245  VITAMINB12 653  FOLATE 8.6  FERRITIN 901*  TIBC 199*  IRON 55  RETICCTPCT 1.5   Sepsis Labs: Recent Labs  Lab 04/13/19 1837 04/13/19 1838 04/13/19 2357 04/15/19 0245  PROCALCITON 0.11  --  0.11 <0.10  LATICACIDVEN  --  1.0  --   --     Recent Results (from the past 240 hour(s))  SARS CORONAVIRUS 2 (TAT 6-24 HRS) Nasopharyngeal Nasopharyngeal Swab     Status: None   Collection Time: 04/13/19 11:03 PM   Specimen: Nasopharyngeal Swab  Result Value Ref Range Status   SARS Coronavirus 2 NEGATIVE NEGATIVE Final    Comment: (NOTE) SARS-CoV-2 target nucleic acids are NOT DETECTED. The SARS-CoV-2 RNA is generally detectable in upper and lower respiratory specimens during the acute phase of infection. Negative results do not preclude SARS-CoV-2 infection, do not rule out co-infections with other pathogens, and should not be used as the sole basis for treatment or other patient management decisions. Negative results must be combined with clinical observations, patient history, and epidemiological information. The expected result is Negative. Fact Sheet for Patients: SugarRoll.be Fact Sheet for Healthcare Providers: https://www.woods-mathews.com/ This test is not yet approved or cleared by the [place] FDA and  has been authorized for  detection and/or diagnosis of SARS-CoV-2 by FDA under an Emergency Use Authorization (EUA). This EUA will remain  in effect (meaning this test can be used) for the duration of the COVID-19 declaration under Section 56 4(b)(1) of the Act, 21 U.S.C. section 360bbb-3(b)(1), unless the authorization is terminated or revoked sooner. Performed at [facility], [place] [address] [place], [place] [address]   Culture, blood (routine x 2)     Status: None (Preliminary result)   Collection Time: 04/13/19 11:24 PM   Specimen: BLOOD  Result Value Ref Range Status   Specimen Description   Final    BLOOD PORTA CATH Performed at [facility] [address] [place], [place] [address]    Special Requests   Final    BOTTLES DRAWN AEROBIC AND ANAEROBIC Blood Culture adequate volume Performed at [facility] [address], [place], [place] [address]    Culture   Final    NO  GROWTH < 12 HOURS Performed at [facility] [address], [place], [place] [address]    Report Status PENDING  Incomplete  Culture, blood (routine x 2)     Status: None (Preliminary result)   Collection Time: 04/13/19 11:29 PM   Specimen: BLOOD  Result Value Ref Range Status   Specimen Description BLOOD SITE NOT SPECIFIED  Final   Special Requests   Final    BOTTLES DRAWN AEROBIC AND ANAEROBIC Blood Culture adequate volume Performed at [facility] [address] [place], [place] [address]    Culture   Final    NO GROWTH < 12 HOURS Performed at [facility] [address], [place], [place] [address]    Report Status PENDING  Incomplete         Radiology Studies: DG Chest 2 View  Result Date: 04/13/2019 CLINICAL DATA:  Fever headache EXAM: CHEST - 2 VIEW COMPARISON:  02/13/2019, PET CT 01/29/2019 FINDINGS: Right-sided central venous port tip over the SVC. Left upper lobe cavitary lung mass appears more lucent in the interval. Punctate  nodularity over the right lower chest possible nipple shadow. Normal heart size. No pneumothorax. IMPRESSION: 1. Left upper lobe cavitary mass has become more radiolucent in the interval. No acute pulmonary infiltrates are seen. 2. Small nodule versus nipple shadow over the right lower lung, repeat image with nipple marker could be obtained. Electronically Signed   By: Donavan Foil M.D.   On: 04/13/2019 17:52   CT Head Wo Contrast  Result Date: 04/13/2019 CLINICAL DATA:  Left lung cancer, right cerebellar metastatic disease EXAM: CT HEAD WITHOUT CONTRAST TECHNIQUE: Contiguous axial images were obtained from the base of the skull through the vertex without intravenous contrast. COMPARISON:  01/12/2019, 01/13/2019 FINDINGS: Brain: Edema within the right cerebellar hemisphere seen previously is slightly diminished, with decreased mass effect on the vermis and fourth ventricle, compatible with known metastatic disease. Since the prior exam, new areas of vasogenic edema have developed within the right frontal, left frontal, right temporal, and left occipital lobes worrisome for new intracranial metastases. MRI may be useful for further evaluation. The largest area of edema within the right frontal lobe results in mild effacement of the right lateral ventricle without significant midline shift or herniation. No signs of acute infarct or hemorrhage. Remaining midline structures are unremarkable. There are no acute extra-axial fluid collections. Vascular: No hyperdense vessel or unexpected calcification. Skull: Normal. Negative for fracture or focal lesion. Sinuses/Orbits: No acute finding. Other: None IMPRESSION: 1. Persistent edema within the right cerebellar hemisphere consistent with known intracranial metastasis. 2. Multiple new areas of vasogenic edema in the bilateral cerebral hemispheres, most pronounced in the right frontal lobe, consistent with progression of metastatic disease. MRI may be useful for further  evaluation. 3. No evidence of acute infarct or hemorrhage. Electronically Signed   By: Randa Ngo M.D.   On: 04/13/2019 19:56   MR BRAIN W WO CONTRAST  Result Date: 04/14/2019 CLINICAL DATA:  Metastatic lung cancer EXAM: MRI HEAD WITHOUT AND WITH CONTRAST TECHNIQUE: Multiplanar, multiecho pulse sequences of the brain and surrounding structures were obtained without and with intravenous contrast. CONTRAST:  55mL GADAVIST GADOBUTROL 1 MMOL/ML IV SOLN COMPARISON:  01/18/2019 FINDINGS: Brain: There are multiple new metastatic lesions. There is edema associated with the larger lesions causing mild regional mass effect. Right frontal measuring 1.7 x 1.7 cm; Parasagittal left frontal measuring 5 mm (series 16, image 126); Left middle frontal gyrus measuring 3  mm (series 16, image 110); Medial left temporal measuring 9 mm; Left occipital measuring 8 mm; Left parietal punctate (series 16, image 80); Anterior right temporal 2 mm (series 16, image 39) Right cerebellar lesion on the prior study measures slightly smaller in size at 1.1 x 2.1 x 1 cm (previously 1.2 x 2.2 x 1.2 cm). Surrounding edema is similar. There is no acute infarction. Minimal leftward midline shift. No hydrocephalus. Foci of susceptibility hypointensity in the cerebellum is likely treatment related. Vascular: Major vessel flow voids at the skull base are preserved. Skull and upper cervical spine: Normal marrow signal is preserved. Sinuses/Orbits: Minor mucosal thickening.  Orbits are unremarkable. Other: Sella is unremarkable.  Mastoid air cells are clear. IMPRESSION: Seven new metastatic lesions with associated edema and mild regional mass effect. Slight decrease in size of right cerebellar lesion with similar edema. Electronically Signed   By: Macy Mis M.D.   On: 04/14/2019 12:59        Scheduled Meds: . Chlorhexidine Gluconate Cloth  6 each Topical Daily  . dexamethasone  4 mg Oral TID  . famotidine  20 mg Oral BID  . feeding  supplement  1 Container Oral TID BM  . folic acid  1 mg Oral Daily  . heparin  5,000 Units Subcutaneous Q12H  . mirtazapine  7.5 mg Oral QHS  . thiamine  100 mg Oral Daily   Continuous Infusions: . ampicillin-sulbactam (UNASYN) IV 3 g (04/15/19 1339)     LOS: 2 days    Time spent: 35 minutes.     Elmarie Shiley, MD [facility]   If 7PM-7AM, please contact night-coverage [email or web address]  04/15/2019, 2:44 PM

## 2019-04-15 NOTE — Progress Notes (Addendum)
Occupational Therapy Evaluation/Discharge:  Clinical Impression: Patient performs all self-care tasks with Modified Independence using SPC for mobilization. Patient was able to retrieve clothing items from bag and complete self-care tasks with Modified Independence. Patient demonstrated no LOB episodes while mobilizing in hallway with IV pole as steadying device. Patient will be seen by OT for evaluation only due to performing at baseline. PT will continue to work on patient's dynamic standing balance with use of SPC.    04/15/19 1400  OT Visit Information  Assistance Needed +1  History of Present Illness Micheal Vincent is a 59 year old male with a past medical history significant for metastatic non-small cell lung cancer with brain metastasis and tobacco use.  He presented to the emergency room with fevers, cough, and generalized lethargy and weakness.  He has had poor p.o. intake for at least 2 weeks and has not been taking his medication  Precautions  Precautions Fall  Restrictions  Weight Bearing Restrictions No  Home Living  Family/patient expects to be discharged to: Private residence  Living Arrangements Children;Other relatives  Available Help at Discharge Family  Type of [place] to enter  Entrance Stairs-Number of Steps 3  Entrance Stairs-Rails Can reach both  Home Layout One level  Bathroom Biomedical scientist Yes  How Accessible Accessible via walker  [place] - single point  Prior Function  Level of Independence Independent with assistive device(s)  Comments occassional use of SPC   Communication  Communication No difficulties  Pain Assessment  Pain Assessment 0-10  Pain Score 3  Pain Location headache  Pain Descriptors / Indicators Constant  Pain Intervention(s) Repositioned;Premedicated before session  Cognition  Arousal/Alertness Awake/alert  Behavior During Therapy WFL for  tasks assessed/performed  Upper Extremity Assessment  Upper Extremity Assessment Overall WFL for tasks assessed  Lower Extremity Assessment  Lower Extremity Assessment Defer to PT evaluation  ADL  Overall ADL's  At baseline;Modified independent  Vision- History  Baseline Vision/History No visual deficits  Bed Mobility  Overal bed mobility Independent  Transfers  Overall transfer level Independent  Balance  Sitting balance-Leahy Scale Good  Standing balance-Leahy Scale Fair  OT - End of Session  Equipment Utilized During Treatment Other (comment) (IV pole )  Activity Tolerance Patient tolerated treatment well  Patient left in chair;with call bell/phone within reach  Nurse Communication Mobility status  OT Assessment  OT Recommendation/Assessment Patient does not need any further OT services  AM-PAC OT "6 Clicks" Daily Activity Outcome Measure (Version 2)  Help from another person eating meals? 4  Help from another person taking care of personal grooming? 4  Help from another person toileting, which includes using toliet, bedpan, or urinal? 4  Help from another person bathing (including washing, rinsing, drying)? 4  Help from another person to put on and taking off regular upper body clothing? 4  Help from another person to put on and taking off regular lower body clothing? 4  6 Click Score 24  OT Recommendation  Follow Up Recommendations No OT follow up  Acute Rehab OT Goals  Patient Stated Goal to gohome   OT Goal Formulation With patient  OT Time Calculation  OT Start Time (ACUTE ONLY) 1357  OT Stop Time (ACUTE ONLY) 1423  OT Time Calculation (min) 26 min  OT General Charges  $OT Visit 1 Visit  OT Treatments  $Self Care/Home Management  8-22 mins  Written Expression  Dominant Hand Right  Micheal Vincent OTR/L
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
## 2019-04-15 NOTE — Progress Notes (Signed)
Radiation Oncology         [phone] ________________________________  Name: Micheal Vincent MRN: [id number]  Date: 04/13/2019  DOB: 30-Sep-1960  Chart Note:  Dr. Tammi Klippel and I were notified of this patient's recent hospital admission and have reviewed his most recent MRI brain findings and wanted to take a minute to document the impression.  He is a well known patient to our service, having previously received SRS to a solitary brain metastasis in 01/2019 and just recently completed a course of concurrent chemoradiation for his oligometastatic NSCLC.  He was recently admitted with progressive cough, intermittent headaches and generalized weakness.  CT Head on admission showed persistent edema in the right cerebellar hemisphere consistent with previously treated metastasis as well as multiple new areas of edema in bilateral cerebral hemispheres, most pronounced in the right frontal lobe, consistent with progression of metastatic disease.  MRI brain was obtained for further evaluation and this confirmed a decrease in size of the previous treated right cerebellar lesion but with 7 new metastatic lesions with associated edema and mild regional mass-effect.  The largest lesion is in the right frontal lobe measuring 1.7 x 1.7 cm.  Additionally, there is a 5 mm parasagittal left frontal lesion, 3 mm left middle frontal gyrus lesion, 9 mm medial left temporal lesion, 8 mm left occipital lesion, 2 mm anterior right temporal lesion and a punctate left parietal lesion.    Lab Findings: Lab Results  Component Value Date   WBC 5.4 04/15/2019   HGB 9.8 (L) 04/15/2019   HCT 31.0 (L) 04/15/2019   MCV 87.8 04/15/2019   PLT 388 04/15/2019    @LASTCHEM @  Radiographic Findings: DG Chest 2 View  Result Date: 04/13/2019 CLINICAL DATA:  Fever headache EXAM: CHEST - 2 VIEW COMPARISON:  02/13/2019, PET CT 01/29/2019 FINDINGS: Right-sided central venous port tip over the SVC. Left upper lobe cavitary lung mass  appears more lucent in the interval. Punctate nodularity over the right lower chest possible nipple shadow. Normal heart size. No pneumothorax. IMPRESSION: 1. Left upper lobe cavitary mass has become more radiolucent in the interval. No acute pulmonary infiltrates are seen. 2. Small nodule versus nipple shadow over the right lower lung, repeat image with nipple marker could be obtained. Electronically Signed   By: Donavan Foil M.D.   On: 04/13/2019 17:52   CT Head Wo Contrast  Result Date: 04/13/2019 CLINICAL DATA:  Left lung cancer, right cerebellar metastatic disease EXAM: CT HEAD WITHOUT CONTRAST TECHNIQUE: Contiguous axial images were obtained from the base of the skull through the vertex without intravenous contrast. COMPARISON:  01/12/2019, 01/13/2019 FINDINGS: Brain: Edema within the right cerebellar hemisphere seen previously is slightly diminished, with decreased mass effect on the vermis and fourth ventricle, compatible with known metastatic disease. Since the prior exam, new areas of vasogenic edema have developed within the right frontal, left frontal, right temporal, and left occipital lobes worrisome for new intracranial metastases. MRI may be useful for further evaluation. The largest area of edema within the right frontal lobe results in mild effacement of the right lateral ventricle without significant midline shift or herniation. No signs of acute infarct or hemorrhage. Remaining midline structures are unremarkable. There are no acute extra-axial fluid collections. Vascular: No hyperdense vessel or unexpected calcification. Skull: Normal. Negative for fracture or focal lesion. Sinuses/Orbits: No acute finding. Other: None IMPRESSION: 1. Persistent edema within the right cerebellar hemisphere consistent with known intracranial metastasis. 2. Multiple new areas of vasogenic edema in the bilateral cerebral  hemispheres, most pronounced in the right frontal lobe, consistent with progression of  metastatic disease. MRI may be useful for further evaluation. 3. No evidence of acute infarct or hemorrhage. Electronically Signed   By: Randa Ngo M.D.   On: 04/13/2019 19:56   MR BRAIN W WO CONTRAST  Result Date: 04/14/2019 CLINICAL DATA:  Metastatic lung cancer EXAM: MRI HEAD WITHOUT AND WITH CONTRAST TECHNIQUE: Multiplanar, multiecho pulse sequences of the brain and surrounding structures were obtained without and with intravenous contrast. CONTRAST:  4mL GADAVIST GADOBUTROL 1 MMOL/ML IV SOLN COMPARISON:  01/18/2019 FINDINGS: Brain: There are multiple new metastatic lesions. There is edema associated with the larger lesions causing mild regional mass effect. Right frontal measuring 1.7 x 1.7 cm; Parasagittal left frontal measuring 5 mm (series 16, image 126); Left middle frontal gyrus measuring 3 mm (series 16, image 110); Medial left temporal measuring 9 mm; Left occipital measuring 8 mm; Left parietal punctate (series 16, image 80); Anterior right temporal 2 mm (series 16, image 39) Right cerebellar lesion on the prior study measures slightly smaller in size at 1.1 x 2.1 x 1 cm (previously 1.2 x 2.2 x 1.2 cm). Surrounding edema is similar. There is no acute infarction. Minimal leftward midline shift. No hydrocephalus. Foci of susceptibility hypointensity in the cerebellum is likely treatment related. Vascular: Major vessel flow voids at the skull base are preserved. Skull and upper cervical spine: Normal marrow signal is preserved. Sinuses/Orbits: Minor mucosal thickening.  Orbits are unremarkable. Other: Sella is unremarkable.  Mastoid air cells are clear. IMPRESSION: Seven new metastatic lesions with associated edema and mild regional mass effect. Slight decrease in size of right cerebellar lesion with similar edema. Electronically Signed   By: Macy Mis M.D.   On: 04/14/2019 12:59    Impression:  In light of this information, he was started on Decadron 4mg  po TID and reports that his  weakness has improved and he is feeling better since admission. He denies further headaches and has not had N/V. We spoke over the phone to review the findings and treatment recommendations which are to proceed with 3T MRI and likely SRS to treat the 7 new lesions. He is scheduled for 3T MRI brain on 04/22/19 which we will use for treatment planning purposes.  He is in agreement with the plan.  Plan:  At this point, the patient is set up to proceed with 3T MRI brain as outpatient on 04/22/19 and pending those findings, will plan to proceed with SRS.  [facility] will be in contact with him in the near future to coordinate treatment with his neurosurgeon, Dr. Vertell Limber. In the interim, we agree with continuing Decadron 4mg  po TID and will plan to taper him off the steroids at completion of his radiation treatment.   Nicholos Johns, PA-C    Tyler Pita, MD  [facility] Direct Dial: [phone]  Fax: [phone] [email or web address]  Skype  [email or web address]

## 2019-04-15 NOTE — Evaluation (Signed)
Physical Therapy Evaluation Patient Details Name: Micheal Vincent MRN: [id number] DOB: 11/06/1960 Today's Date: 04/15/2019   History of Present Illness  Micheal Vincent is a 59 year old male with a past medical history significant for metastatic non-small cell lung cancer with brain metastasis and tobacco use.  He presented to the emergency room with fevers, cough, and generalized lethargy and weakness.  He has had poor p.o. intake for at least 2 weeks and has not been taking his medication  Clinical Impression  Pt admitted as above and presenting with functional mobility limitations 2* generalized weakness and mild ambulatory balance deficits.  Pt should progress to dc home with family assist.    Follow Up Recommendations No PT follow up    Equipment Recommendations  None recommended by PT    Recommendations for Other Services       Precautions / Restrictions Precautions Precautions: Fall Restrictions Weight Bearing Restrictions: No      Mobility  Bed Mobility               General bed mobility comments: Pt up in chair and requests back to same  Transfers Overall transfer level: Needs assistance Equipment used: Straight cane Transfers: Sit to/from Stand Sit to Stand: Min guard;Supervision         General transfer comment: mild instability on initial rising but self corrected  Ambulation/Gait Ambulation/Gait assistance: Min assist;Min guard Gait Distance (Feet): 450 Feet Assistive device: Straight cane Gait Pattern/deviations: Step-through pattern;Decreased step length - right;Decreased step length - left;Shuffle;Staggering right;Narrow base of support     General Gait Details: mod pace with mild general instability and two episodes balance loss to R but self corrected  Stairs            Wheelchair Mobility    Modified Rankin (Stroke Patients Only)       Balance Overall balance assessment: Needs assistance Sitting-balance support: No upper extremity  supported;Feet supported Sitting balance-Leahy Scale: Normal     Standing balance support: No upper extremity supported Standing balance-Leahy Scale: Fair                               Pertinent Vitals/Pain Pain Assessment: 0-10 Pain Score: 3  Pain Location: headache Pain Descriptors / Indicators: Constant Pain Intervention(s): Limited activity within patient's tolerance;Monitored during session    Home Living Family/patient expects to be discharged to:: Private residence Living Arrangements: Children;Other relatives Available Help at Discharge: Family Type of Home: House Home Access: Stairs to enter Entrance Stairs-Rails: Can reach both Entrance Stairs-Number of Steps: 3 Home Layout: One level Home Equipment: Cane - single point      Prior Function Level of Independence: Independent with assistive device(s)         Comments: Use of cane outside     Hand Dominance   Dominant Hand: Right    Extremity/Trunk Assessment   Upper Extremity Assessment Upper Extremity Assessment: Overall WFL for tasks assessed    Lower Extremity Assessment Lower Extremity Assessment: Generalized weakness       Communication   Communication: No difficulties  Cognition Arousal/Alertness: Awake/alert Behavior During Therapy: WFL for tasks assessed/performed Overall Cognitive Status: Within Functional Limits for tasks assessed                                        General Comments  Exercises     Assessment/Plan    PT Assessment Patient needs continued PT services  PT Problem List Decreased balance;Decreased activity tolerance;Decreased mobility       PT Treatment Interventions DME instruction;Gait training;Stair training;Functional mobility training;Therapeutic activities;Therapeutic exercise;Balance training    PT Goals (Current goals can be found in the Care Plan section)  Acute Rehab PT Goals Patient Stated Goal: to gohome  PT Goal  Formulation: With patient Time For Goal Achievement: 04/29/19 Potential to Achieve Goals: Good    Frequency Min 3X/week   Barriers to discharge        Co-evaluation               AM-PAC PT "6 Clicks" Mobility  Outcome Measure Help needed turning from your back to your side while in a flat bed without using bedrails?: None Help needed moving from lying on your back to sitting on the side of a flat bed without using bedrails?: None Help needed moving to and from a bed to a chair (including a wheelchair)?: None Help needed standing up from a chair using your arms (e.g., wheelchair or bedside chair)?: A Little Help needed to walk in hospital room?: A Little Help needed climbing 3-5 steps with a railing? : A Little 6 Click Score: 21    End of Session Equipment Utilized During Treatment: Gait belt Activity Tolerance: Patient tolerated treatment well Patient left: in chair;with call bell/phone within reach Nurse Communication: Mobility status PT Visit Diagnosis: Difficulty in walking, not elsewhere classified (R26.2)    Time: 4818-5631 PT Time Calculation (min) (ACUTE ONLY): 17 min   Charges:   PT Evaluation $PT Eval Low Complexity: 1 Low          [place] Pager [phone] Office [phone]   Elliett Guarisco 04/15/2019, 3:58 PM

## 2019-04-16 MED ORDER — HEPARIN SOD (PORK) LOCK FLUSH 100 UNIT/ML IV SOLN
500.0000 [IU] | INTRAVENOUS | Status: AC | PRN
  Administered 2019-04-16: 500 [IU]
  Filled 2019-04-16: qty 5

## 2019-04-16 MED ORDER — AMOXICILLIN-POT CLAVULANATE 875-125 MG PO TABS: 1.0000 | ORAL_TABLET | Freq: Two times a day (BID) | ORAL | 0 refills | Status: AC

## 2019-04-16 MED ORDER — ALBUTEROL SULFATE HFA 108 (90 BASE) MCG/ACT IN AERS: 2.0000 | INHALATION_SPRAY | Freq: Four times a day (QID) | RESPIRATORY_TRACT | 0 refills | Status: DC | PRN

## 2019-04-16 MED ORDER — DEXAMETHASONE 4 MG PO TABS: 4.0000 mg | ORAL_TABLET | Freq: Three times a day (TID) | ORAL | 1 refills | Status: DC

## 2019-04-16 MED ORDER — IPRATROPIUM-ALBUTEROL 0.5-2.5 (3) MG/3ML IN SOLN: 3.0000 mL | Freq: Four times a day (QID) | RESPIRATORY_TRACT | 11 refills | Status: DC | PRN

## 2019-04-16 MED ORDER — ONDANSETRON HCL 8 MG PO TABS: 8.0000 mg | ORAL_TABLET | Freq: Three times a day (TID) | ORAL | 1 refills | Status: AC | PRN

## 2019-04-16 MED ORDER — MIRTAZAPINE 7.5 MG PO TABS: 7.5000 mg | ORAL_TABLET | Freq: Every day | ORAL | 1 refills | Status: AC

## 2019-04-16 MED FILL — ALBUTEROL SULFATE HFA 108 (: 108 (90 BAS | 25 days supply | Qty: 9 | Fill #0

## 2019-04-16 MED FILL — ONDANSETRON HCL 8 MG TABLET: 8 | 7 days supply | Qty: 20 | Fill #0

## 2019-04-16 MED FILL — IPRAT-ALBUT 0.5-3(2.5) MG/3: 0.5-2.5 (3) | 30 days supply | Qty: 360 | Fill #0

## 2019-04-16 MED FILL — MIRTAZAPINE 7.5 MG TABLET: 7.5 | 30 days supply | Qty: 30 | Fill #0

## 2019-04-16 MED FILL — DEXAMETHASONE 4 MG TABLET: 4 | 30 days supply | Qty: 90 | Fill #0

## 2019-04-16 MED FILL — AMOX-CLAV 875-125 MG TABLET: 875-125 | 5 days supply | Qty: 10 | Fill #0

## 2019-04-16 NOTE — Discharge Summary (Signed)
Physician Discharge Summary  Jerold Yoss [facility]:[id number] DOB: December 23, 1960 DOA: 04/13/2019  PCP: Patient, No Pcp Per  Admit date: 04/13/2019 Discharge date: 04/16/2019  Admitted From: Home  Disposition:  Home    Recommendations for Outpatient Follow-up:  1. Follow up with PCP in 1-2 weeks 2. Please obtain BMP/CBC in one week 3.   Home Health: yes.   Discharge Condition: Stable.  CODE STATUS: Full code Diet recommendation: Heart Healthy  Brief/Interim Summary: 59 year old with PMH significant for Metastatic NSCLC ( brain mets) tobacco use who presents with fevers, cough and generalized lethargy and weakness.  Overall he continued to fail weak and unwell, poor oral intake.  He report remarkable weight loss of 40 to 50 pounds over the last month.  He reports chronic poor taste.  His family checked his temperature and he was found to be febrile.   1-Pneumonia, probably post obstructive;  Covid 19 negative. Follow Blood cultures. No growth to date.  Chest x  Ray; left upper lobe cavitary mass has become more radiolucent.  Started  Unasyn to cover for postobstructive pneumonia, received 2 days. Discharge on 5 more days of Augmentin .  Afebrile now.    2-Metastatic non-small cell lung cancer: Patient now with concern for new progression of brain mets on CT head. CT head with multiple new areas of vasogenic edema in bilateral cerebral hemisphere, most pronounced in the right frontal lobe, consistent with progression of metastatic disease MRI of the brain confirmed 7 new metastatic lesions. Continue with dexamethasone 4 mg 3 times daily. Dr. Lorenso Courier consulted.  He will also inform Dr. Tammi Klippel with radiation oncology. Discussed with radiation oncology plan is for [facility] treatment to brain mets, they will arrange MRI out patient for same purpose.   Leukopenia: Resolved  Anemia, malignancy. Hemoglobin stable Iron trial.    Discharge Diagnoses:  Active Problems:   Fever of unknown  origin   Fever   Protein-calorie malnutrition, severe    Discharge Instructions  Discharge Instructions    Diet - low sodium heart healthy   Complete by: As directed    Increase activity slowly   Complete by: As directed      Allergies as of 04/16/2019   No Known Allergies     Medication List    STOP taking these medications   ibuprofen 200 MG tablet Commonly known as: ADVIL   prochlorperazine 10 MG tablet Commonly known as: COMPAZINE     TAKE these medications   acetaminophen 325 MG tablet Commonly known as: TYLENOL Take 2 tablets (650 mg total) by mouth every 6 (six) hours as needed for mild pain, moderate pain or headache.   albuterol 108 (90 Base) MCG/ACT inhaler Commonly known as: VENTOLIN HFA Inhale 2 puffs into the lungs every 6 (six) hours as needed for wheezing or shortness of breath.   amoxicillin-clavulanate 875-125 MG tablet Commonly known as: Augmentin Take 1 tablet by mouth 2 (two) times daily for 5 days.   aspirin 325 MG tablet Take 325 mg by mouth every 6 (six) hours as needed for moderate pain.   dexamethasone 4 MG tablet Commonly known as: DECADRON Take 1 tablet (4 mg total) by mouth 3 (three) times daily.   famotidine 20 MG tablet Commonly known as: PEPCID Take 1 tablet (20 mg total) by mouth 2 (two) times daily.   folic acid 1 MG tablet Commonly known as: FOLVITE Take 1 tablet (1 mg total) by mouth daily.   HYDROcodone-acetaminophen 7.5-325 mg/15 ml solution Commonly known  as: HYCET Take 10 mLs by mouth every 6 (six) hours as needed (for cough suppression).   ipratropium-albuterol 0.5-2.5 (3) MG/3ML Soln Commonly known as: DUONEB Take 3 mLs by nebulization every 6 (six) hours as needed.   lidocaine-prilocaine cream Commonly known as: EMLA Apply 1 application topically as needed.   loperamide 2 MG capsule Commonly known as: IMODIUM Take 1 capsule (2 mg total) by mouth as needed for diarrhea or loose stools (Take 2 pills at first  loose stool. Take 1 pill every 2 hours thereafter until diarrhea stops.).   mirtazapine 7.5 MG tablet Commonly known as: REMERON Take 1 tablet (7.5 mg total) by mouth at bedtime.   ondansetron 8 MG tablet Commonly known as: ZOFRAN Take 1 tablet (8 mg total) by mouth every 8 (eight) hours as needed for nausea or vomiting.   thiamine 100 MG tablet Take 1 tablet (100 mg total) by mouth daily.            Durable Medical Equipment  (From admission, onward)         Start     Ordered   04/16/19 0927  For home use only DME Nebulizer machine  Once    Question Answer Comment  Patient needs a nebulizer to treat with the following condition COPD exacerbation ([place])   Length of Need 12 Months      04/16/19 0926          No Known Allergies  Consultations:  Radiation Oncology   Procedures/Studies: DG Chest 2 View  Result Date: 04/13/2019 CLINICAL DATA:  Fever headache EXAM: CHEST - 2 VIEW COMPARISON:  02/13/2019, PET CT 01/29/2019 FINDINGS: Right-sided central venous port tip over the SVC. Left upper lobe cavitary lung mass appears more lucent in the interval. Punctate nodularity over the right lower chest possible nipple shadow. Normal heart size. No pneumothorax. IMPRESSION: 1. Left upper lobe cavitary mass has become more radiolucent in the interval. No acute pulmonary infiltrates are seen. 2. Small nodule versus nipple shadow over the right lower lung, repeat image with nipple marker could be obtained. Electronically Signed   By: Donavan Foil M.D.   On: 04/13/2019 17:52   CT Head Wo Contrast  Result Date: 04/13/2019 CLINICAL DATA:  Left lung cancer, right cerebellar metastatic disease EXAM: CT HEAD WITHOUT CONTRAST TECHNIQUE: Contiguous axial images were obtained from the base of the skull through the vertex without intravenous contrast. COMPARISON:  01/12/2019, 01/13/2019 FINDINGS: Brain: Edema within the right cerebellar hemisphere seen previously is slightly diminished, with  decreased mass effect on the vermis and fourth ventricle, compatible with known metastatic disease. Since the prior exam, new areas of vasogenic edema have developed within the right frontal, left frontal, right temporal, and left occipital lobes worrisome for new intracranial metastases. MRI may be useful for further evaluation. The largest area of edema within the right frontal lobe results in mild effacement of the right lateral ventricle without significant midline shift or herniation. No signs of acute infarct or hemorrhage. Remaining midline structures are unremarkable. There are no acute extra-axial fluid collections. Vascular: No hyperdense vessel or unexpected calcification. Skull: Normal. Negative for fracture or focal lesion. Sinuses/Orbits: No acute finding. Other: None IMPRESSION: 1. Persistent edema within the right cerebellar hemisphere consistent with known intracranial metastasis. 2. Multiple new areas of vasogenic edema in the bilateral cerebral hemispheres, most pronounced in the right frontal lobe, consistent with progression of metastatic disease. MRI may be useful for further evaluation. 3. No evidence of acute infarct or  hemorrhage. Electronically Signed   By: Randa Ngo M.D.   On: 04/13/2019 19:56   MR BRAIN W WO CONTRAST  Result Date: 04/14/2019 CLINICAL DATA:  Metastatic lung cancer EXAM: MRI HEAD WITHOUT AND WITH CONTRAST TECHNIQUE: Multiplanar, multiecho pulse sequences of the brain and surrounding structures were obtained without and with intravenous contrast. CONTRAST:  31mL GADAVIST GADOBUTROL 1 MMOL/ML IV SOLN COMPARISON:  01/18/2019 FINDINGS: Brain: There are multiple new metastatic lesions. There is edema associated with the larger lesions causing mild regional mass effect. Right frontal measuring 1.7 x 1.7 cm; Parasagittal left frontal measuring 5 mm (series 16, image 126); Left middle frontal gyrus measuring 3 mm (series 16, image 110); Medial left temporal measuring 9 mm;  Left occipital measuring 8 mm; Left parietal punctate (series 16, image 80); Anterior right temporal 2 mm (series 16, image 39) Right cerebellar lesion on the prior study measures slightly smaller in size at 1.1 x 2.1 x 1 cm (previously 1.2 x 2.2 x 1.2 cm). Surrounding edema is similar. There is no acute infarction. Minimal leftward midline shift. No hydrocephalus. Foci of susceptibility hypointensity in the cerebellum is likely treatment related. Vascular: Major vessel flow voids at the skull base are preserved. Skull and upper cervical spine: Normal marrow signal is preserved. Sinuses/Orbits: Minor mucosal thickening.  Orbits are unremarkable. Other: Sella is unremarkable.  Mastoid air cells are clear. IMPRESSION: Seven new metastatic lesions with associated edema and mild regional mass effect. Slight decrease in size of right cerebellar lesion with similar edema. Electronically Signed   By: Macy Mis M.D.   On: 04/14/2019 12:59     Subjective: He is feeling well, breathing well   Discharge Exam: Vitals:   04/16/19 0651 04/16/19 1310  BP: 117/86 118/77  Pulse: 81 72  Resp: 20 18  Temp: 98.6 F (37 C) 97.8 F (36.6 C)  SpO2: 100% 98%     General: Pt is alert, awake, not in acute distress Cardiovascular: RRR, S1/S2 +, no rubs, no gallops Respiratory: CTA bilaterally, no wheezing, no rhonchi Abdominal: Soft, NT, ND, bowel sounds + Extremities: no edema, no cyanosis    The results of significant diagnostics from this hospitalization (including imaging, microbiology, ancillary and laboratory) are listed below for reference.     Microbiology: Recent Results (from the past 240 hour(s))  SARS CORONAVIRUS 2 (TAT 6-24 HRS) Nasopharyngeal Nasopharyngeal Swab     Status: None   Collection Time: 04/13/19 11:03 PM   Specimen: Nasopharyngeal Swab  Result Value Ref Range Status   SARS Coronavirus 2 NEGATIVE NEGATIVE Final    Comment: (NOTE) SARS-CoV-2 target nucleic acids are NOT  DETECTED. The SARS-CoV-2 RNA is generally detectable in upper and lower respiratory specimens during the acute phase of infection. Negative results do not preclude SARS-CoV-2 infection, do not rule out co-infections with other pathogens, and should not be used as the sole basis for treatment or other patient management decisions. Negative results must be combined with clinical observations, patient history, and epidemiological information. The expected result is Negative. Fact Sheet for Patients: SugarRoll.be Fact Sheet for Healthcare Providers: https://www.woods-mathews.com/ This test is not yet approved or cleared by the [place] FDA and  has been authorized for detection and/or diagnosis of SARS-CoV-2 by FDA under an Emergency Use Authorization (EUA). This EUA will remain  in effect (meaning this test can be used) for the duration of the COVID-19 declaration under Section 56 4(b)(1) of the Act, 21 U.S.C. section 360bbb-3(b)(1), unless the authorization is terminated or revoked  sooner. Performed at [facility], [place] [address] [place], [place] [address]   Culture, blood (routine x 2)     Status: None (Preliminary result)   Collection Time: 04/13/19 11:24 PM   Specimen: BLOOD  Result Value Ref Range Status   Specimen Description BLOOD PORTA CATH  Final   Special Requests   Final    BOTTLES DRAWN AEROBIC AND ANAEROBIC Blood Culture adequate volume Performed at [facility] [address] [place], [place] [address]    Culture NO GROWTH 1 DAY  Final   Report Status PENDING  Incomplete  Culture, blood (routine x 2)     Status: None (Preliminary result)   Collection Time: 04/13/19 11:29 PM   Specimen: BLOOD  Result Value Ref Range Status   Specimen Description BLOOD SITE NOT SPECIFIED  Final   Special Requests   Final    BOTTLES DRAWN AEROBIC AND ANAEROBIC Blood Culture adequate volume Performed at [facility] [address] [place], [place] [address]    Culture NO GROWTH 1 DAY  Final   Report Status PENDING  Incomplete     Labs: BNP (last 3 results) No results for input(s): BNP in the last 8760 hours. Basic Metabolic Panel: Recent Labs  Lab 04/13/19 1837 04/14/19 0315 04/15/19 0245  NA 134* 135 138  K 4.1 4.1 4.3  CL 99 101 103  CO2 24 24 24   GLUCOSE 94 101* 183*  BUN 6 7 11   CREATININE 0.81 0.61 0.69  CALCIUM 8.9 8.7* 8.7*   Liver Function Tests: Recent Labs  Lab 04/13/19 1837  AST 26  ALT 25  ALKPHOS 66  BILITOT 0.7  PROT 6.9  ALBUMIN 2.6*   No results for input(s): LIPASE, AMYLASE in the last 168 hours. No results for input(s): AMMONIA in the last 168 hours. CBC: Recent Labs  Lab 04/13/19 1837 04/14/19 0315 04/15/19 0245  WBC 4.3 2.9* 5.4  NEUTROABS 3.2  --   --   HGB 9.9* 9.0* 9.8*  HCT 31.4* 28.6* 31.0*  MCV 87.0 87.7 87.8  PLT 329 321 388   Cardiac Enzymes: No results for input(s): CKTOTAL, CKMB, CKMBINDEX, TROPONINI in the last 168 hours. BNP: Invalid input(s): POCBNP CBG: No results for input(s): GLUCAP in the last 168 hours. D-Dimer No results for input(s): DDIMER in the last 72 hours. Hgb A1c No results for input(s): HGBA1C in the last 72 hours. Lipid Profile No results for input(s): CHOL, HDL, LDLCALC, TRIG, CHOLHDL, LDLDIRECT in the last 72 hours. Thyroid function studies No results for input(s): TSH, T4TOTAL, T3FREE, THYROIDAB in the last 72 hours.  Invalid input(s): FREET3 Anemia work up Recent Labs    04/15/19 0245  VITAMINB12 653  FOLATE 8.6  FERRITIN 901*  TIBC 199*  IRON 55  RETICCTPCT 1.5   Urinalysis    Component Value Date/Time   COLORURINE AMBER (A) 04/13/2019 1629   APPEARANCEUR CLEAR 04/13/2019 1629   LABSPEC 1.021 04/13/2019 1629   PHURINE 6.0 04/13/2019 1629   GLUCOSEU NEGATIVE 04/13/2019 1629   HGBUR NEGATIVE 04/13/2019 1629   BILIRUBINUR NEGATIVE 04/13/2019 [place] 04/13/2019 1629   PROTEINUR 30 (A) 04/13/2019 1629   UROBILINOGEN 1.0 05/10/2013 1651   NITRITE NEGATIVE 04/13/2019 1629   LEUKOCYTESUR NEGATIVE 04/13/2019 1629   Sepsis Labs Invalid input(s): PROCALCITONIN,  WBC,  LACTICIDVEN Microbiology Recent Results (from the past 240 hour(s))  SARS CORONAVIRUS 2 (TAT 6-24 HRS) Nasopharyngeal Nasopharyngeal Swab  Status: None   Collection Time: 04/13/19 11:03 PM   Specimen: Nasopharyngeal Swab  Result Value Ref Range Status   SARS Coronavirus 2 NEGATIVE NEGATIVE Final    Comment: (NOTE) SARS-CoV-2 target nucleic acids are NOT DETECTED. The SARS-CoV-2 RNA is generally detectable in upper and lower respiratory specimens during the acute phase of infection. Negative results do not preclude SARS-CoV-2 infection, do not rule out co-infections with other pathogens, and should not be used as the sole basis for treatment or other patient management decisions. Negative results must be combined with clinical observations, patient history, and epidemiological information. The expected result is Negative. Fact Sheet for Patients: SugarRoll.be Fact Sheet for Healthcare Providers: https://www.woods-mathews.com/ This test is not yet approved or cleared by the [place] FDA and  has been authorized for detection and/or diagnosis of SARS-CoV-2 by FDA under an Emergency Use Authorization (EUA). This EUA will remain  in effect (meaning this test can be used) for the duration of the COVID-19 declaration under Section 56 4(b)(1) of the Act, 21 U.S.C. section 360bbb-3(b)(1), unless the authorization is terminated or revoked sooner. Performed at [facility], [place] [address] [place], [place] [address]   Culture, blood (routine x 2)     Status: None (Preliminary result)   Collection Time: 04/13/19 11:24 PM   Specimen: BLOOD  Result Value Ref Range Status   Specimen Description BLOOD PORTA CATH   Final   Special Requests   Final    BOTTLES DRAWN AEROBIC AND ANAEROBIC Blood Culture adequate volume Performed at [facility] [address] [place], [place] [address]    Culture NO GROWTH 1 DAY  Final   Report Status PENDING  Incomplete  Culture, blood (routine x 2)     Status: None (Preliminary result)   Collection Time: 04/13/19 11:29 PM   Specimen: BLOOD  Result Value Ref Range Status   Specimen Description BLOOD SITE NOT SPECIFIED  Final   Special Requests   Final    BOTTLES DRAWN AEROBIC AND ANAEROBIC Blood Culture adequate volume Performed at [facility] [address], [place], [place] [address]    Culture NO GROWTH 1 DAY  Final   Report Status PENDING  Incomplete     Time coordinating discharge: 40 minutes  SIGNED:   Elmarie Shiley, MD  [facility]

## 2019-04-18 NOTE — Progress Notes (Signed)
  Radiation Oncology         [id number]) [phone] ________________________________  Name: Micheal Vincent MRN: [id number]  Date: 04/06/2019  DOB: 1960/08/11   End of Treatment Note  Diagnosis:    59 yo man with stage T2bN2M1b. Stage IVa non-small cell carcinoma of the left upper lung     Indication for treatment:  Curative, Chemo-Radiotherapy       Radiation treatment dates:   02/08/19-04/06/19  Site/dose:   The primary tumor and involved mediastinal adenopathy were treated to 66 Gy in 33 fractions of 2 Gy.  Beams/energy:   A five field 3D conformal treatment arrangement was used delivering 6 and 10 MV photons.  Daily image-guidance CT was used to align the treatment with the targeted volume  Narrative: The patient tolerated radiation treatment relatively well.  The patient experienced some esophagitis characterized as moderate, and it responded to Carafate.  The patient also noted fatigue.  Plan: The patient has completed radiation treatment. The patient will return to radiation oncology clinic for routine followup in one month. I advised him to call or return sooner if he has any questions or concerns related to his recovery or treatment.  ________________________________  Sheral Apley. Tammi Klippel, M.D.

## 2019-04-19 ENCOUNTER — Inpatient Hospital Stay: Payer: Medicaid Other | Admitting: Hematology and Oncology

## 2019-04-19 ENCOUNTER — Inpatient Hospital Stay: Payer: Medicaid Other

## 2019-04-19 ENCOUNTER — Telehealth: Payer: Self-pay | Admitting: Hematology and Oncology

## 2019-04-19 ENCOUNTER — Telehealth: Payer: Self-pay | Admitting: *Deleted

## 2019-04-19 LAB — CULTURE, BLOOD (ROUTINE X 2)
Culture: NO GROWTH
Culture: NO GROWTH
Special Requests: ADEQUATE
Special Requests: ADEQUATE

## 2019-04-19 NOTE — Telephone Encounter (Signed)
Received call from pt's sister, [name]. She states Estes was discharged from the hospital on Friday, 04/16/19.  She wanted to confirm his upcoming appts. Reviewed his appts with her. She states he is doing better with increased appetite and less coughing since he started the steroids for his new brain mets. No other questions or concerns

## 2019-04-19 NOTE — Telephone Encounter (Signed)
R/s appt per 2/26 sch message - pt is aware of appt date and time

## 2019-04-22 ENCOUNTER — Other Ambulatory Visit: Payer: Self-pay

## 2019-04-22 ENCOUNTER — Encounter: Payer: Self-pay | Admitting: Radiation Oncology

## 2019-04-22 ENCOUNTER — Ambulatory Visit
Admission: RE | Admit: 2019-04-22 | Discharge: 2019-04-22 | Disposition: A | Discharge status: Home / Self Care | Payer: Medicaid Other | Source: Ambulatory Visit | Attending: Radiation Oncology | Admitting: Radiation Oncology

## 2019-04-22 ENCOUNTER — Other Ambulatory Visit: Payer: Self-pay | Admitting: Radiation Therapy

## 2019-04-22 DIAGNOSIS — C7949 Secondary malignant neoplasm of other parts of nervous system: Secondary | ICD-10-CM

## 2019-04-22 DIAGNOSIS — C7931 Secondary malignant neoplasm of brain: Secondary | ICD-10-CM

## 2019-04-22 MED ORDER — GADOBENATE DIMEGLUMINE 529 MG/ML IV SOLN
16.0000 mL | Freq: Once | INTRAVENOUS | Status: AC | PRN
  Administered 2019-04-22: 16 mL via INTRAVENOUS

## 2019-04-22 NOTE — Progress Notes (Signed)
Has armband been applied?  Yes.    Does patient have an allergy to IV contrast dye?: No.   Has patient ever received premedication for IV contrast dye?: No.   Does patient take metformin?: No.  If patient does take metformin when was the last dose: n/a  Date of lab work: 04/15/2019 BUN: 11 CR: 0.69  IV site: subclavian right, condition no redness  Has IV site been added to flowsheet?  Yes.

## 2019-04-22 NOTE — Progress Notes (Signed)
Location/Histology of Brain Tumor: Non small cell lung cancer with 13 brain mets found on 3T MRI  Patient presented with symptoms of:  Progressive cough, intermittent headache and generalized weakness  Past or anticipated interventions, if any, per neurosurgery: no  Past or anticipated interventions, if any, per medical oncology: 02/08/2019 started definitive chemoradiation with Carboplatin/Paclitaxel then, 04/02/2019 intended final chemotherapy treatment. 5 weeks of treatment administered over the course of chemoradiation.  Dose of Decadron, if applicable: decadron 4 mg tid. Thrush noted. Informed Dr. Tammi Klippel of new onset thrush. Patient request medication for thrush be sent to [facility] long outpatient pharmacy.  Recent neurologic symptoms, if any:   Seizures: denies  Headaches: yes that resolve with decadron  Nausea: denies  Dizziness/ataxia: denies  Difficulty with hand coordination: denies  Focal numbness/weakness: denies  Visual deficits/changes: floaters both eye continuously  Confusion/Memory deficits: declining short term memory  Painful bone metastases at present, if any: denies  SAFETY ISSUES:  Prior radiation? yes  Pacemaker/ICD? no  Possible current pregnancy? no, male patient  Is the patient on methotrexate? no  Additional Complaints / other details: 59 year old african [place] male. Divorced.  04/22/19 3T MRI revealed: Multiple metastatic deposits in the brain. Thirteen lesions identified.  The largest lesion in the right frontal lobe contains hemorrhage and significant amount of vasogenic edema. The enhancing component is more well-defined and slightly smaller compared with 04/14/2019. Other lesions are stable.

## 2019-04-23 ENCOUNTER — Ambulatory Visit
Admission: RE | Admit: 2019-04-23 | Discharge: 2019-04-23 | Disposition: A | Discharge status: Home / Self Care | Payer: Medicaid Other | Source: Ambulatory Visit | Attending: Radiation Oncology | Admitting: Radiation Oncology

## 2019-04-23 ENCOUNTER — Encounter: Payer: Self-pay | Admitting: Radiation Oncology

## 2019-04-23 ENCOUNTER — Other Ambulatory Visit: Payer: Self-pay

## 2019-04-23 VITALS — BP 128/81 | HR 80 | Temp 98.3°F | Resp 18 | Ht 72.0 in | Wt 164.0 lb

## 2019-04-23 DIAGNOSIS — Z510 Encounter for antineoplastic radiation therapy: Secondary | ICD-10-CM | POA: Diagnosis present

## 2019-04-23 DIAGNOSIS — Z7982 Long term (current) use of aspirin: Secondary | ICD-10-CM | POA: Insufficient documentation

## 2019-04-23 DIAGNOSIS — C3412 Malignant neoplasm of upper lobe, left bronchus or lung: Secondary | ICD-10-CM | POA: Insufficient documentation

## 2019-04-23 DIAGNOSIS — Z79899 Other long term (current) drug therapy: Secondary | ICD-10-CM | POA: Diagnosis not present

## 2019-04-23 DIAGNOSIS — R51 Headache with orthostatic component, not elsewhere classified: Secondary | ICD-10-CM | POA: Diagnosis not present

## 2019-04-23 DIAGNOSIS — F17210 Nicotine dependence, cigarettes, uncomplicated: Secondary | ICD-10-CM | POA: Diagnosis not present

## 2019-04-23 DIAGNOSIS — Z809 Family history of malignant neoplasm, unspecified: Secondary | ICD-10-CM | POA: Insufficient documentation

## 2019-04-23 DIAGNOSIS — C7949 Secondary malignant neoplasm of other parts of nervous system: Secondary | ICD-10-CM | POA: Insufficient documentation

## 2019-04-23 DIAGNOSIS — C7931 Secondary malignant neoplasm of brain: Secondary | ICD-10-CM

## 2019-04-23 DIAGNOSIS — R531 Weakness: Secondary | ICD-10-CM | POA: Insufficient documentation

## 2019-04-23 HISTORY — DX: Malignant neoplasm of unspecified part of unspecified bronchus or lung: C34.90

## 2019-04-23 MED ORDER — FLUCONAZOLE 100 MG PO TABS: 100.0000 mg | ORAL_TABLET | Freq: Every day | ORAL | 0 refills | Status: AC

## 2019-04-23 MED ORDER — HEPARIN SOD (PORK) LOCK FLUSH 100 UNIT/ML IV SOLN
500.0000 [IU] | Freq: Once | INTRAVENOUS | Status: AC
  Administered 2019-04-23: 500 [IU] via INTRAVENOUS

## 2019-04-23 MED ORDER — SODIUM CHLORIDE 0.9% FLUSH
10.0000 mL | Freq: Once | INTRAVENOUS | Status: AC
  Administered 2019-04-23: 10 mL via INTRAVENOUS

## 2019-04-23 MED FILL — FLUCONAZOLE 100 MG TAB: 100 | 9 days supply | Qty: 10 | Fill #0

## 2019-04-23 NOTE — Progress Notes (Signed)
See progress note under physician encounter. 
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
## 2019-04-23 NOTE — Progress Notes (Signed)
Accessed right subclavian power port on the first attempt. Patient tolerated well. Secured access needle in place. Following CT/SIM flushed port per protocol, deaccessed port and applied a bandaid. Patient tolerated this well. Access needle intact upon removal.

## 2019-04-25 NOTE — Progress Notes (Signed)
  Radiation Oncology         [phone] ________________________________  Name: Cannen Dupras MRN: [id number]  Date: 04/23/2019  DOB: May 14, 1960  SIMULATION AND TREATMENT PLANNING NOTE    ICD-10-CM   1. Secondary malignant neoplasm of brain and spinal cord (HCC)  C79.31    C79.49     DIAGNOSIS:  59 yo man with stage T2bN2M1b. Stage IVa non-small cell carcinoma of the left upper lung with 13 brain metastases  NARRATIVE:  The patient was brought to the [facility].  Identity was confirmed.  All relevant records and images related to the planned course of therapy were reviewed.  The patient freely provided informed written consent to proceed with treatment after reviewing the details related to the planned course of therapy. The consent form was witnessed and verified by the simulation staff. Intravenous access was established for contrast administration. Then, the patient was set-up in a stable reproducible supine position for radiation therapy.  A relocatable thermoplastic stereotactic head frame was fabricated for precise immobilization.  CT images were obtained.  Surface markings were placed.  The CT images were loaded into the planning software and fused with the patient's targeting MRI scan.  Then the target and avoidance structures were contoured.  Treatment planning then occurred.  The radiation prescription was entered and confirmed.  I have requested 3D planning  I have requested a DVH of the following structures: Brain stem, brain, left eye, right eye, lenses, optic chiasm, target volumes, uninvolved brain, and normal tissue.    SPECIAL TREATMENT PROCEDURE:  The planned course of therapy using radiation constitutes a special treatment procedure. Special care is required in the management of this patient for the following reasons. This treatment constitutes a Special Treatment Procedure for the following reason: High dose per fraction requiring special monitoring for increased  toxicities of treatment including daily imaging.  The special nature of the planned course of radiotherapy will require increased physician supervision and oversight to ensure patient's safety with optimal treatment outcomes.  PLAN:  The patient will receive 20 Gy in 1 fraction to all new and larger lesions using a single isocenter technique.  ________________________________  Sheral Apley Tammi Klippel, M.D.

## 2019-04-27 DIAGNOSIS — C7931 Secondary malignant neoplasm of brain: Secondary | ICD-10-CM | POA: Diagnosis not present

## 2019-04-28 ENCOUNTER — Other Ambulatory Visit: Payer: Self-pay

## 2019-04-28 ENCOUNTER — Encounter: Payer: Self-pay | Admitting: Radiation Oncology

## 2019-04-28 ENCOUNTER — Ambulatory Visit: Payer: Self-pay | Admitting: Urology

## 2019-04-28 ENCOUNTER — Telehealth: Payer: Self-pay | Admitting: *Deleted

## 2019-04-28 ENCOUNTER — Ambulatory Visit
Admission: RE | Admit: 2019-04-28 | Discharge: 2019-04-28 | Disposition: A | Discharge status: Home / Self Care | Payer: Medicaid Other | Source: Ambulatory Visit | Attending: Radiation Oncology | Admitting: Radiation Oncology

## 2019-04-28 VITALS — BP 141/96 | HR 77 | Temp 98.7°F | Resp 18

## 2019-04-28 DIAGNOSIS — C7931 Secondary malignant neoplasm of brain: Secondary | ICD-10-CM | POA: Diagnosis not present

## 2019-04-28 DIAGNOSIS — C7949 Secondary malignant neoplasm of other parts of nervous system: Secondary | ICD-10-CM

## 2019-04-28 NOTE — Patient Instructions (Signed)
You underwent stereotatic radiosurgery today. Please avoid strenuous activity for the next 24 hours. Please phone [phone] with needs or concerns related to your procedure today. If it is after hours please request the radiation oncologist on call.   Also, you are presently taking decadron 4 mg three times per day. Please continue this for the next seven days then, begin taking decadron 4 mg twice per day for seven days. Then, break the 4 mg tablet in half and take one half in the morning and one half in the afternoon for seven days. Then, begin taking half a tablet once per day in the morning and then STOP.   If you begin to suffer from headaches or nausea while tapering off decadron please begin again taking decadron 4 mg three times per day and phone your nurse at [phone].   Instructions reviewed with patient and understanding verbalized.

## 2019-04-28 NOTE — Telephone Encounter (Signed)
Received call from pt's sister, [name].  She is just calling to confirm pt's appt for tomorrow.  Confirmed appt with her.  She states Decker is doing better. He is eating well and has gained 15 lbs in the last few weeks since he has been living with her.  She states he is till smoking, though not as much as he had been prior to his lung cancer diagnosis. [name] stated she will bring him to his appts tomorrow

## 2019-04-28 NOTE — Progress Notes (Signed)
Nurse monitoring following SRS treatment complete. Patient denies new onset of neurologic symptoms. Patient states, "I feel fine." Reviewed AVS with patient to include decadron taper instructions as follows:Also, you are presently taking decadron 4 mg three times per day. Please continue this for the next seven days then, begin taking decadron 4 mg twice per day for seven days. Then, break the 4 mg tablet in half and take one half in the morning and one half in the afternoon for seven days. Then, begin taking half a tablet once per day in the morning and then STOP. If you begin to suffer from headaches or nausea while tapering off decadron please begin again taking decadron 4 mg three times per day and phone your nurse at [phone]. Instructed patient to share these instructions with his sister and he committed to doing so. Patient discharged home alert and oriented x 3 in no distress. Understand patient will travel home via [place].

## 2019-04-28 NOTE — Op Note (Signed)
  Name: Micheal Vincent  MRN: [id number]  Date: 04/28/2019   DOB: Jun 20, 1960  Stereotactic Radiosurgery Operative Note  PRE-OPERATIVE DIAGNOSIS:  Multiple Brain Metastases  POST-OPERATIVE DIAGNOSIS:  Multiple Brain Metastases  PROCEDURE:  Stereotactic Radiosurgery  SURGEON:  Peggyann Shoals, MD  NARRATIVE: The patient underwent a radiation treatment planning session in the radiation oncology simulation suite under the care of the radiation oncology physician and physicist.  I participated closely in the radiation treatment planning afterwards. The patient underwent planning CT which was fused to 3T high resolution MRI with 1 mm axial slices.  These images were fused on the planning system.  We contoured the gross target volumes and subsequently expanded this to yield the Planning Target Volume. I actively participated in the planning process.  I helped to define and review the target contours and also the contours of the optic pathway, eyes, brainstem and selected nearby organs at risk.  All the dose constraints for critical structures were reviewed and compared to AAPM Task Group 101.  The prescription dose conformity was reviewed.  I approved the plan electronically.    Accordingly, Watson Robarge was brought to the TrueBeam stereotactic radiation treatment linac and placed in the custom immobilization mask.  The patient was aligned according to the IR fiducial markers with BrainLab Exactrac, then orthogonal x-rays were used in ExacTrac with the 6DOF robotic table and the shifts were made to align the patient  [name] received stereotactic radiosurgery uneventfully.    Lesions treated:  13   Complex lesions treated:  0 (>3.5 cm, <14mm of optic path, or within the brainstem)   The detailed description of the procedure is recorded in the radiation oncology procedure note.  I was available by phone throughout the procedure without being present, due to the COVID-19 pandemic).  DISPOSITION:  Following  delivery, the patient was transported to nursing in stable condition and monitored for possible acute effects to be discharged to home in stable condition with follow-up in one month.  Peggyann Shoals, MD 04/28/2019 4:54 PM

## 2019-04-29 ENCOUNTER — Other Ambulatory Visit: Payer: Self-pay

## 2019-04-29 ENCOUNTER — Inpatient Hospital Stay: Payer: Medicaid Other | Attending: Hematology and Oncology

## 2019-04-29 ENCOUNTER — Inpatient Hospital Stay: Payer: Medicaid Other

## 2019-04-29 ENCOUNTER — Inpatient Hospital Stay (HOSPITAL_BASED_OUTPATIENT_CLINIC_OR_DEPARTMENT_OTHER): Payer: Medicaid Other | Admitting: Hematology and Oncology

## 2019-04-29 ENCOUNTER — Other Ambulatory Visit: Payer: Self-pay | Admitting: *Deleted

## 2019-04-29 ENCOUNTER — Encounter: Payer: Self-pay | Admitting: Hematology and Oncology

## 2019-04-29 ENCOUNTER — Ambulatory Visit: Payer: Medicaid Other | Admitting: Nutrition

## 2019-04-29 ENCOUNTER — Ambulatory Visit
Admission: RE | Admit: 2019-04-29 | Discharge: 2019-04-29 | Disposition: A | Discharge status: Home / Self Care | Payer: Medicaid Other | Source: Ambulatory Visit

## 2019-04-29 VITALS — BP 134/92 | HR 87 | Temp 98.3°F | Resp 18 | Ht 72.0 in

## 2019-04-29 DIAGNOSIS — C7931 Secondary malignant neoplasm of brain: Secondary | ICD-10-CM

## 2019-04-29 DIAGNOSIS — K30 Functional dyspepsia: Secondary | ICD-10-CM | POA: Insufficient documentation

## 2019-04-29 DIAGNOSIS — R519 Headache, unspecified: Secondary | ICD-10-CM | POA: Diagnosis not present

## 2019-04-29 DIAGNOSIS — Z9221 Personal history of antineoplastic chemotherapy: Secondary | ICD-10-CM | POA: Diagnosis not present

## 2019-04-29 DIAGNOSIS — C3412 Malignant neoplasm of upper lobe, left bronchus or lung: Secondary | ICD-10-CM | POA: Diagnosis present

## 2019-04-29 DIAGNOSIS — Z95828 Presence of other vascular implants and grafts: Secondary | ICD-10-CM

## 2019-04-29 DIAGNOSIS — F17210 Nicotine dependence, cigarettes, uncomplicated: Secondary | ICD-10-CM | POA: Insufficient documentation

## 2019-04-29 DIAGNOSIS — C3490 Malignant neoplasm of unspecified part of unspecified bronchus or lung: Secondary | ICD-10-CM

## 2019-04-29 DIAGNOSIS — F1290 Cannabis use, unspecified, uncomplicated: Secondary | ICD-10-CM | POA: Insufficient documentation

## 2019-04-29 DIAGNOSIS — R410 Disorientation, unspecified: Secondary | ICD-10-CM | POA: Diagnosis not present

## 2019-04-29 DIAGNOSIS — Z8249 Family history of ischemic heart disease and other diseases of the circulatory system: Secondary | ICD-10-CM | POA: Insufficient documentation

## 2019-04-29 DIAGNOSIS — R5383 Other fatigue: Secondary | ICD-10-CM | POA: Diagnosis not present

## 2019-04-29 DIAGNOSIS — Z79899 Other long term (current) drug therapy: Secondary | ICD-10-CM | POA: Diagnosis not present

## 2019-04-29 DIAGNOSIS — C7901 Secondary malignant neoplasm of right kidney and renal pelvis: Secondary | ICD-10-CM | POA: Diagnosis not present

## 2019-04-29 DIAGNOSIS — C3492 Malignant neoplasm of unspecified part of left bronchus or lung: Secondary | ICD-10-CM | POA: Diagnosis not present

## 2019-04-29 DIAGNOSIS — C7951 Secondary malignant neoplasm of bone: Secondary | ICD-10-CM | POA: Insufficient documentation

## 2019-04-29 DIAGNOSIS — J984 Other disorders of lung: Secondary | ICD-10-CM | POA: Insufficient documentation

## 2019-04-29 DIAGNOSIS — R590 Localized enlarged lymph nodes: Secondary | ICD-10-CM | POA: Insufficient documentation

## 2019-04-29 DIAGNOSIS — I700 Atherosclerosis of aorta: Secondary | ICD-10-CM | POA: Insufficient documentation

## 2019-04-29 DIAGNOSIS — R112 Nausea with vomiting, unspecified: Secondary | ICD-10-CM | POA: Insufficient documentation

## 2019-04-29 DIAGNOSIS — Z923 Personal history of irradiation: Secondary | ICD-10-CM | POA: Insufficient documentation

## 2019-04-29 DIAGNOSIS — R634 Abnormal weight loss: Secondary | ICD-10-CM | POA: Insufficient documentation

## 2019-04-29 DIAGNOSIS — R509 Fever, unspecified: Secondary | ICD-10-CM | POA: Diagnosis not present

## 2019-04-29 DIAGNOSIS — D1803 Hemangioma of intra-abdominal structures: Secondary | ICD-10-CM | POA: Insufficient documentation

## 2019-04-29 DIAGNOSIS — Z7952 Long term (current) use of systemic steroids: Secondary | ICD-10-CM | POA: Diagnosis not present

## 2019-04-29 DIAGNOSIS — R0781 Pleurodynia: Secondary | ICD-10-CM | POA: Diagnosis not present

## 2019-04-29 DIAGNOSIS — R197 Diarrhea, unspecified: Secondary | ICD-10-CM | POA: Insufficient documentation

## 2019-04-29 DIAGNOSIS — Z833 Family history of diabetes mellitus: Secondary | ICD-10-CM | POA: Insufficient documentation

## 2019-04-29 DIAGNOSIS — Z809 Family history of malignant neoplasm, unspecified: Secondary | ICD-10-CM | POA: Insufficient documentation

## 2019-04-29 DIAGNOSIS — C7902 Secondary malignant neoplasm of left kidney and renal pelvis: Secondary | ICD-10-CM | POA: Insufficient documentation

## 2019-04-29 DIAGNOSIS — C7971 Secondary malignant neoplasm of right adrenal gland: Secondary | ICD-10-CM | POA: Diagnosis not present

## 2019-04-29 LAB — CBC WITH DIFFERENTIAL (CANCER CENTER ONLY)
Abs Immature Granulocytes: 0.39 10*3/uL — ABNORMAL HIGH (ref 0.00–0.07)
Basophils Absolute: 0 10*3/uL (ref 0.0–0.1)
Basophils Relative: 0 %
Eosinophils Absolute: 0 10*3/uL (ref 0.0–0.5)
Eosinophils Relative: 0 %
HCT: 35.8 % — ABNORMAL LOW (ref 39.0–52.0)
Hemoglobin: 11.3 g/dL — ABNORMAL LOW (ref 13.0–17.0)
Immature Granulocytes: 3 %
Lymphocytes Relative: 2 %
Lymphs Abs: 0.3 10*3/uL — ABNORMAL LOW (ref 0.7–4.0)
MCH: 28.9 pg (ref 26.0–34.0)
MCHC: 31.6 g/dL (ref 30.0–36.0)
MCV: 91.6 fL (ref 80.0–100.0)
Monocytes Absolute: 0.9 10*3/uL (ref 0.1–1.0)
Monocytes Relative: 7 %
Neutro Abs: 12.7 10*3/uL — ABNORMAL HIGH (ref 1.7–7.7)
Neutrophils Relative %: 88 %
Platelet Count: 268 10*3/uL (ref 150–400)
RBC: 3.91 MIL/uL — ABNORMAL LOW (ref 4.22–5.81)
RDW: 25.9 % — ABNORMAL HIGH (ref 11.5–15.5)
WBC Count: 14.3 10*3/uL — ABNORMAL HIGH (ref 4.0–10.5)
nRBC: 1 % — ABNORMAL HIGH (ref 0.0–0.2)

## 2019-04-29 LAB — CMP (CANCER CENTER ONLY)
ALT: 46 U/L — ABNORMAL HIGH (ref 0–44)
AST: 15 U/L (ref 15–41)
Albumin: 2.6 g/dL — ABNORMAL LOW (ref 3.5–5.0)
Alkaline Phosphatase: 89 U/L (ref 38–126)
Anion gap: 9 (ref 5–15)
BUN: 13 mg/dL (ref 6–20)
CO2: 27 mmol/L (ref 22–32)
Calcium: 8.2 mg/dL — ABNORMAL LOW (ref 8.9–10.3)
Chloride: 101 mmol/L (ref 98–111)
Creatinine: 0.7 mg/dL (ref 0.61–1.24)
GFR, Est AFR Am: >60 mL/min (ref >60)
GFR, Estimated: >60 mL/min (ref >60)
Glucose, Bld: 104 mg/dL — ABNORMAL HIGH (ref 70–99)
Potassium: 4.2 mmol/L (ref 3.5–5.1)
Sodium: 137 mmol/L (ref 135–145)
Total Bilirubin: 0.4 mg/dL (ref 0.3–1.2)
Total Protein: 6 g/dL — ABNORMAL LOW (ref 6.5–8.1)

## 2019-04-29 MED ORDER — SODIUM CHLORIDE 0.9% FLUSH
10.0000 mL | INTRAVENOUS | Status: DC | PRN
  Administered 2019-04-29: 10 mL
  Filled 2019-04-29: qty 10

## 2019-04-29 MED ORDER — FAMOTIDINE 20 MG PO TABS: 20.0000 mg | ORAL_TABLET | Freq: Two times a day (BID) | ORAL | 1 refills | Status: AC

## 2019-04-29 MED ORDER — HEPARIN SOD (PORK) LOCK FLUSH 100 UNIT/ML IV SOLN
500.0000 [IU] | Freq: Once | INTRAVENOUS | Status: AC | PRN
  Administered 2019-04-29: 500 [IU]
  Filled 2019-04-29: qty 5

## 2019-04-29 MED FILL — FAMOTIDINE 20 MG TABLET: 20 | 30 days supply | Qty: 60 | Fill #0
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
## 2019-04-29 NOTE — Progress Notes (Signed)
Patient received 1 complementary case of Ensure Enlive. 

## 2019-04-29 NOTE — Patient Instructions (Signed)

## 2019-04-30 ENCOUNTER — Telehealth: Payer: Self-pay | Admitting: Hematology and Oncology

## 2019-04-30 ENCOUNTER — Other Ambulatory Visit: Payer: Self-pay | Admitting: *Deleted

## 2019-04-30 ENCOUNTER — Ambulatory Visit: Payer: Medicaid Other | Admitting: Radiation Oncology

## 2019-04-30 NOTE — Telephone Encounter (Signed)
Scheduled 3/11 los. Called and spoke with patient and sister [place] Confirmed all appts

## 2019-04-30 NOTE — Progress Notes (Signed)
Met with patient and his sister to discuss goals of care and to provide additional support. Micheal Vincent is struggling with grief related to the death of two of his sons last year and the loss of his own independence in setting of advanced cancer. For most of today's visit I allow for open discussion and space for him to talk about the things that matter most to him and also addressed his symptom management needs.  Recommendations:  1. Patient is interested in advance care planning- will ask Micheal Vincent to see him to help with this and to also facilitate spiritual support given his recent struggles and loss.  2. I addressed issues with pain, appetite and anxiety. At this time he does not feel like he needs medication.  3. His sister is very supportive.Micheal Vincent is struggling with feeling like he is a burden on his family. We discussed getting additional support from CSW coping with these feelings.  4. Appreciate Nutrition Management helping with Ensure Supplements today.  Lane Hacker, DO Palliative Medicine   Time: 60 minutes Greater than 50%  of this time was spent counseling and coordinating care related to the above assessment and plan.

## 2019-05-03 NOTE — Progress Notes (Signed)
  Radiation Oncology         [phone]) [phone] ________________________________  Stereotactic Treatment Procedure Note  Name: Juleon Narang MRN: [id number]  Date: 04/28/2019  DOB: 25-Jan-1961  SPECIAL TREATMENT PROCEDURE    ICD-10-CM   1. Secondary malignant neoplasm of brain and spinal cord ([place])  C79.31    C79.49     3D TREATMENT PLANNING AND DOSIMETRY:  The patient's radiation plan was reviewed and approved by neurosurgery and radiation oncology prior to treatment.  It showed 3-dimensional radiation distributions overlaid onto the planning CT/MRI image set.  The [facility] for the target structures as well as the organs at risk were reviewed. The documentation of the 3D plan and dosimetry are filed in the radiation oncology EMR.  NARRATIVE:  Weyman Bogdon was brought to the TrueBeam stereotactic radiation treatment machine and placed supine on the CT couch. The head frame was applied, and the patient was set up for stereotactic radiosurgery.  Neurosurgery was present for the set-up and delivery  SIMULATION VERIFICATION:  In the couch zero-angle position, the patient underwent Exactrac imaging using the Brainlab system with orthogonal KV images.  These were carefully aligned and repeated to confirm treatment position for each of the isocenters.  The Exactrac snap film verification was repeated at each couch angle.  PROCEDURE: [name] received stereotactic radiosurgery to the following targets: The 12 new brain met targets were treated using 5 Rapid Arc VMAT Beams to a prescription dose of 20 Gy.  ExacTrac registration was performed for each couch angle.  The 100% isodose line was prescribed.  6 MV X-rays were delivered in the flattening filter free beam mode.  STEREOTACTIC TREATMENT MANAGEMENT:  Following delivery, the patient was transported to nursing in stable condition and monitored for possible acute effects.  Vital signs were recorded BP (!) 141/96   Pulse 77   Temp 98.7 F (37.1 C)   Resp 18    SpO2 100% . The patient tolerated treatment without significant acute effects, and was discharged to home in stable condition.    PLAN: Follow-up in one month.  ________________________________  Sheral Apley. Tammi Klippel, M.D.

## 2019-05-04 ENCOUNTER — Encounter: Payer: Self-pay | Admitting: *Deleted

## 2019-05-04 NOTE — Progress Notes (Signed)
  Radiation Oncology         [phone]) [phone] ________________________________  Name: Micheal Vincent MRN: [id number]  Date: 04/28/2019  DOB: [birth year]  End of Treatment Note  Diagnosis:   59 yo man with stage [id number]. Stage IVa non-small cell carcinoma of the left upper lung with 12 new brain metastases     Indication for treatment:  Palliation       Radiation treatment dates:   04/28/19  Site/dose/beams/energy:   The 12 new brain met targets were treated using 5 Rapid Arc VMAT Beams to a prescription dose of 20 Gy.  ExacTrac registration was performed for each couch angle.  The 100% isodose line was prescribed.  6 MV X-rays were delivered in the flattening filter free beam mode.  Narrative: The patient tolerated radiation treatment relatively well.   No acute complications occurred.  Plan: The patient has completed radiation treatment. The patient will return to radiation oncology clinic for routine followup in one month. I advised him to call or return sooner if he has any questions or concerns related to his recovery or treatment. He was given decadron taper instructions as follows.  He presently taking decadron 4 mg three times per day. Please continue this for the next seven days then, begin taking decadron 4 mg twice per day for seven days. Then, break the 4 mg tablet in half and take one half in the morning and one half in the afternoon for seven days. Then, begin taking half a tablet once per day in the morning and then STOP. ________________________________  Sheral Apley. Tammi Klippel, M.D.

## 2019-05-04 NOTE — Progress Notes (Signed)
[place] Work  Clinical Social Work contacted patient at home to offer support and assess for needs.  CSW left patient a message offering support and encouraging him to return call.    Johnnye Lana, MSW, LCSW, OSW-C Clinical Social Worker [facility] [phone]

## 2019-05-07 ENCOUNTER — Encounter (HOSPITAL_COMMUNITY): Payer: Self-pay

## 2019-05-07 ENCOUNTER — Telehealth: Payer: Self-pay | Admitting: *Deleted

## 2019-05-07 ENCOUNTER — Emergency Department (HOSPITAL_COMMUNITY)
Admission: EM | Admit: 2019-05-07 | Discharge: 2019-05-07 | Disposition: A | Discharge status: Home / Self Care | Payer: Medicaid Other | Attending: Emergency Medicine | Admitting: Emergency Medicine

## 2019-05-07 ENCOUNTER — Other Ambulatory Visit: Payer: Self-pay

## 2019-05-07 DIAGNOSIS — F17210 Nicotine dependence, cigarettes, uncomplicated: Secondary | ICD-10-CM | POA: Insufficient documentation

## 2019-05-07 DIAGNOSIS — Z79899 Other long term (current) drug therapy: Secondary | ICD-10-CM | POA: Diagnosis not present

## 2019-05-07 DIAGNOSIS — K921 Melena: Secondary | ICD-10-CM | POA: Diagnosis not present

## 2019-05-07 DIAGNOSIS — K625 Hemorrhage of anus and rectum: Secondary | ICD-10-CM | POA: Diagnosis present

## 2019-05-07 DIAGNOSIS — Z85118 Personal history of other malignant neoplasm of bronchus and lung: Secondary | ICD-10-CM | POA: Insufficient documentation

## 2019-05-07 LAB — COMPREHENSIVE METABOLIC PANEL
ALT: 33 U/L (ref 0–44)
AST: 21 U/L (ref 15–41)
Albumin: 3.1 g/dL — ABNORMAL LOW (ref 3.5–5.0)
Alkaline Phosphatase: 89 U/L (ref 38–126)
Anion gap: 12 (ref 5–15)
BUN: 19 mg/dL (ref 6–20)
CO2: 24 mmol/L (ref 22–32)
Calcium: 8.6 mg/dL — ABNORMAL LOW (ref 8.9–10.3)
Chloride: 99 mmol/L (ref 98–111)
Creatinine, Ser: 0.61 mg/dL (ref 0.61–1.24)
GFR calc Af Amer: >60 mL/min (ref >60)
GFR calc non Af Amer: >60 mL/min (ref >60)
Glucose, Bld: 98 mg/dL (ref 70–99)
Potassium: 4 mmol/L (ref 3.5–5.1)
Sodium: 135 mmol/L (ref 135–145)
Total Bilirubin: 0.5 mg/dL (ref 0.3–1.2)
Total Protein: 6.8 g/dL (ref 6.5–8.1)

## 2019-05-07 LAB — CBC
HCT: 38.1 % — ABNORMAL LOW (ref 39.0–52.0)
Hemoglobin: 11.8 g/dL — ABNORMAL LOW (ref 13.0–17.0)
MCH: 29.1 pg (ref 26.0–34.0)
MCHC: 31 g/dL (ref 30.0–36.0)
MCV: 93.8 fL (ref 80.0–100.0)
Platelets: 166 10*3/uL (ref 150–400)
RBC: 4.06 MIL/uL — ABNORMAL LOW (ref 4.22–5.81)
RDW: 24.5 % — ABNORMAL HIGH (ref 11.5–15.5)
WBC: 9.1 10*3/uL (ref 4.0–10.5)
nRBC: 0 % (ref 0.0–0.2)

## 2019-05-07 LAB — POC OCCULT BLOOD, ED: Fecal Occult Bld: POSITIVE — AB

## 2019-05-07 LAB — TYPE AND SCREEN
ABO/RH(D): O POS
Antibody Screen: NEGATIVE

## 2019-05-07 LAB — ABO/RH: ABO/RH(D): O POS

## 2019-05-07 NOTE — Telephone Encounter (Signed)
Spoke with Dr. Lorenso Courier and he was in agreement that pt should go to ED for evaluation.  TCT [name] and spoke with her. Informed her that she should take pt to ED. She agreed and will take him to [facility] ED  Dr. Lorenso Courier aware of the above.

## 2019-05-07 NOTE — Telephone Encounter (Signed)
Received call from pt's sister, [name]. She states that her brother, Izacc, had what he thought was a bowel movement but it actually was blood. No stool noted.  He and his sister thought it was quite a lot of blood. Pt states he feels fine. No dizziness, pain, diaphoresis. No h/o hemorrhoids  Per patient.  No c/o GI upset.  Advised that I would inform Dr. Lorenso Courier and call them right back. Advised that he might need to go to ED for evaluation.  They both voiced understanding.
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
## 2019-05-07 NOTE — Discharge Instructions (Addendum)
Your bloodwork was reassuring today.  I felt it was reasonable to discharge you home and keep an eye on your bleeding.  You may continue having some blood in your stool for the next 2-3 days.  If the bleeding INCREASES, or you begin to feel short of breath or lightheaded, you should come back to the ER.  Otherwise, I would like for you to follow up with a GI doctor.  You will eventually need a colonoscopy done.  This is a camera scope in your rectum that lets them look at the health of your bowels.  It is a routine part of cancer screening in men over the age of 58.  Please call [facility] today or Monday to ask for an appointment in the office.

## 2019-05-07 NOTE — ED Notes (Signed)
One blue, one dark green, two gold tubes saved down in main lab

## 2019-05-07 NOTE — ED Triage Notes (Signed)
Patient states he has been having intermitent rectal bleeding x 1 year, but today he reports a large amount of bright red blood in the toilet and on the tissue patient. Patient states his physician have been aware of the small mount of rectal bleeding x 1 year.

## 2019-05-07 NOTE — ED Notes (Signed)
Occult card at bedside 

## 2019-05-10 ENCOUNTER — Encounter: Payer: Self-pay | Admitting: Internal Medicine

## 2019-05-11 ENCOUNTER — Telehealth (HOSPITAL_BASED_OUTPATIENT_CLINIC_OR_DEPARTMENT_OTHER): Payer: Medicaid Other | Admitting: Internal Medicine

## 2019-05-11 DIAGNOSIS — F419 Anxiety disorder, unspecified: Secondary | ICD-10-CM | POA: Insufficient documentation

## 2019-05-11 DIAGNOSIS — G893 Neoplasm related pain (acute) (chronic): Secondary | ICD-10-CM | POA: Insufficient documentation

## 2019-05-11 DIAGNOSIS — Z515 Encounter for palliative care: Secondary | ICD-10-CM | POA: Insufficient documentation

## 2019-05-11 DIAGNOSIS — K921 Melena: Secondary | ICD-10-CM | POA: Insufficient documentation

## 2019-05-11 MED ORDER — CLONAZEPAM 1 MG PO TABS: 1.0000 mg | ORAL_TABLET | Freq: Two times a day (BID) | ORAL | 0 refills | Status: DC | PRN

## 2019-05-11 MED ORDER — DICYCLOMINE HCL 10 MG PO CAPS: 10.0000 mg | ORAL_CAPSULE | ORAL | 0 refills | Status: AC | PRN

## 2019-05-11 MED ORDER — HYDROCODONE-ACETAMINOPHEN 10-325 MG PO TABS: 1.0000 | ORAL_TABLET | Freq: Three times a day (TID) | ORAL | 0 refills | Status: AC | PRN

## 2019-05-12 ENCOUNTER — Inpatient Hospital Stay: Payer: Medicaid Other

## 2019-05-12 ENCOUNTER — Ambulatory Visit (HOSPITAL_COMMUNITY)
Admission: RE | Admit: 2019-05-12 | Discharge: 2019-05-12 | Disposition: A | Discharge status: Home / Self Care | Payer: Medicaid Other | Source: Ambulatory Visit | Attending: Hematology and Oncology | Admitting: Hematology and Oncology

## 2019-05-12 ENCOUNTER — Other Ambulatory Visit: Payer: Self-pay | Admitting: Hematology and Oncology

## 2019-05-12 ENCOUNTER — Inpatient Hospital Stay (HOSPITAL_BASED_OUTPATIENT_CLINIC_OR_DEPARTMENT_OTHER): Payer: Medicaid Other | Admitting: Hematology and Oncology

## 2019-05-12 ENCOUNTER — Ambulatory Visit: Payer: Medicaid Other

## 2019-05-12 ENCOUNTER — Other Ambulatory Visit: Payer: Self-pay

## 2019-05-12 ENCOUNTER — Encounter (HOSPITAL_COMMUNITY): Payer: Self-pay

## 2019-05-12 VITALS — BP 131/97 | HR 90 | Temp 98.2°F | Resp 19 | Ht 72.0 in | Wt 172.9 lb

## 2019-05-12 DIAGNOSIS — C7931 Secondary malignant neoplasm of brain: Secondary | ICD-10-CM | POA: Diagnosis not present

## 2019-05-12 DIAGNOSIS — C3492 Malignant neoplasm of unspecified part of left bronchus or lung: Secondary | ICD-10-CM

## 2019-05-12 DIAGNOSIS — F419 Anxiety disorder, unspecified: Secondary | ICD-10-CM

## 2019-05-12 DIAGNOSIS — C3490 Malignant neoplasm of unspecified part of unspecified bronchus or lung: Secondary | ICD-10-CM | POA: Diagnosis not present

## 2019-05-12 DIAGNOSIS — Z7189 Other specified counseling: Secondary | ICD-10-CM

## 2019-05-12 DIAGNOSIS — Z95828 Presence of other vascular implants and grafts: Secondary | ICD-10-CM

## 2019-05-12 DIAGNOSIS — C3412 Malignant neoplasm of upper lobe, left bronchus or lung: Secondary | ICD-10-CM | POA: Diagnosis not present

## 2019-05-12 LAB — CBC WITH DIFFERENTIAL (CANCER CENTER ONLY)
Abs Immature Granulocytes: 0.16 10*3/uL — ABNORMAL HIGH (ref 0.00–0.07)
Basophils Absolute: 0 10*3/uL (ref 0.0–0.1)
Basophils Relative: 0 %
Eosinophils Absolute: 0 10*3/uL (ref 0.0–0.5)
Eosinophils Relative: 0 %
HCT: 34.4 % — ABNORMAL LOW (ref 39.0–52.0)
Hemoglobin: 11.1 g/dL — ABNORMAL LOW (ref 13.0–17.0)
Immature Granulocytes: 3 %
Lymphocytes Relative: 3 %
Lymphs Abs: 0.2 10*3/uL — ABNORMAL LOW (ref 0.7–4.0)
MCH: 29.6 pg (ref 26.0–34.0)
MCHC: 32.3 g/dL (ref 30.0–36.0)
MCV: 91.7 fL (ref 80.0–100.0)
Monocytes Absolute: 0.3 10*3/uL (ref 0.1–1.0)
Monocytes Relative: 5 %
Neutro Abs: 5.8 10*3/uL (ref 1.7–7.7)
Neutrophils Relative %: 89 %
Platelet Count: 117 10*3/uL — ABNORMAL LOW (ref 150–400)
RBC: 3.75 MIL/uL — ABNORMAL LOW (ref 4.22–5.81)
RDW: 23.8 % — ABNORMAL HIGH (ref 11.5–15.5)
WBC Count: 6.5 10*3/uL (ref 4.0–10.5)
nRBC: 0.3 % — ABNORMAL HIGH (ref 0.0–0.2)

## 2019-05-12 LAB — TSH: TSH: 0.881 u[IU]/mL (ref 0.320–4.118)

## 2019-05-12 LAB — CMP (CANCER CENTER ONLY)
ALT: 35 U/L (ref 0–44)
AST: 12 U/L — ABNORMAL LOW (ref 15–41)
Albumin: 2.6 g/dL — ABNORMAL LOW (ref 3.5–5.0)
Alkaline Phosphatase: 95 U/L (ref 38–126)
Anion gap: 9 (ref 5–15)
BUN: 14 mg/dL (ref 6–20)
CO2: 25 mmol/L (ref 22–32)
Calcium: 8.6 mg/dL — ABNORMAL LOW (ref 8.9–10.3)
Chloride: 101 mmol/L (ref 98–111)
Creatinine: 0.78 mg/dL (ref 0.61–1.24)
GFR, Est AFR Am: >60 mL/min (ref >60)
GFR, Estimated: >60 mL/min (ref >60)
Glucose, Bld: 113 mg/dL — ABNORMAL HIGH (ref 70–99)
Potassium: 4 mmol/L (ref 3.5–5.1)
Sodium: 135 mmol/L (ref 135–145)
Total Bilirubin: 0.5 mg/dL (ref 0.3–1.2)
Total Protein: 6.3 g/dL — ABNORMAL LOW (ref 6.5–8.1)

## 2019-05-12 MED ORDER — HEPARIN SOD (PORK) LOCK FLUSH 100 UNIT/ML IV SOLN
500.0000 [IU] | Freq: Once | INTRAVENOUS | Status: DC | PRN
  Filled 2019-05-12: qty 5

## 2019-05-12 MED ORDER — HEPARIN SOD (PORK) LOCK FLUSH 100 UNIT/ML IV SOLN
500.0000 [IU] | Freq: Once | INTRAVENOUS | Status: AC | PRN
  Administered 2019-05-12: 500 [IU]
  Filled 2019-05-12: qty 5

## 2019-05-12 MED ORDER — SODIUM CHLORIDE 0.9% FLUSH
10.0000 mL | INTRAVENOUS | Status: DC | PRN
  Administered 2019-05-12: 10 mL
  Filled 2019-05-12: qty 10

## 2019-05-12 MED ORDER — HEPARIN SOD (PORK) LOCK FLUSH 100 UNIT/ML IV SOLN
INTRAVENOUS | Status: AC
  Filled 2019-05-12: qty 5

## 2019-05-12 MED ORDER — HEPARIN SOD (PORK) LOCK FLUSH 100 UNIT/ML IV SOLN
500.0000 [IU] | Freq: Once | INTRAVENOUS | Status: AC
  Administered 2019-05-12: 500 [IU] via INTRAVENOUS

## 2019-05-12 MED ORDER — SODIUM CHLORIDE (PF) 0.9 % IJ SOLN
INTRAMUSCULAR | Status: AC
  Filled 2019-05-12: qty 50

## 2019-05-12 MED ORDER — IOHEXOL 300 MG/ML  SOLN
100.0000 mL | Freq: Once | INTRAMUSCULAR | Status: AC | PRN
  Administered 2019-05-12: 100 mL via INTRAVENOUS

## 2019-05-12 NOTE — Progress Notes (Signed)
Palliative Care Symptom Management Telephone Visit  Micheal Vincent sister requested follow-up regarding her brother's pain. I spoke with Micheal Vincent this afternoon and also spoke with his sister. He was recently seen for blood in his stools in the ED last week. He has had worsening pain in his "flanks". The pain is difficult to localize, it wakes him up from sleep at night. His sister tells me he has been eating a bottle of coated tums a day and ice cream sandwiches-otherwise his appetite is poor. He has also been very anxious over the past week. He tells me that his stomach "cramps" and the pain is a 10/10 but goes away after about 10 minutes-worse when he eats.  I reviewed his current medication regimen. He is not on medication for pain -but did get relief with hycodan cough syrup given to him last month for a persistent cough. He denies any issues with constipation.  Recommendations:  1. Hydrocodone 10-325 q6 hours PRN pain 2. Klonopin 0.5 BID prn for anxiety and sleep 3. Bentyl 10mg  PO q6 hours prn for stomach cramping.  He has follow up tomorrow with Dr. Lorenso Courier and is scheduled for a CT Abdomen pelvis with contrast.  Will follow as needed.  Lane Hacker, DO Palliative Medicine  Time: 20 minutes Greater than 50%  of this time was spent counseling and coordinating care related to the above assessment and plan.   1.

## 2019-05-12 NOTE — Progress Notes (Signed)
No authorization required for imfinzi per PA team.   Demetrius Charity, PharmD, [place] Pharmacist Pharmacy Phone: [phone] 05/12/2019

## 2019-05-17 ENCOUNTER — Other Ambulatory Visit: Payer: Self-pay | Admitting: Hematology and Oncology

## 2019-05-17 ENCOUNTER — Telehealth: Payer: Self-pay | Admitting: *Deleted

## 2019-05-17 DIAGNOSIS — Z7189 Other specified counseling: Secondary | ICD-10-CM

## 2019-05-17 DIAGNOSIS — C3492 Malignant neoplasm of unspecified part of left bronchus or lung: Secondary | ICD-10-CM

## 2019-05-17 NOTE — Telephone Encounter (Signed)
TCT pt's siter, [name]. Spoke with and advised that appt for 05/19/19 will be changed to Friday, not sure of time.  Ozias will also see Dr. Lorenso Courier and hopefully start his next round of immunotherapy.  [name] voiced understanding. She is aware that a scheduler will call to confirm times on Friday.

## 2019-05-18 ENCOUNTER — Encounter: Payer: Self-pay | Admitting: Hematology and Oncology

## 2019-05-18 DIAGNOSIS — Z7189 Other specified counseling: Secondary | ICD-10-CM | POA: Insufficient documentation
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
## 2019-05-19 ENCOUNTER — Telehealth: Payer: Self-pay | Admitting: Hematology and Oncology

## 2019-05-19 ENCOUNTER — Inpatient Hospital Stay: Payer: Medicaid Other | Admitting: Hematology and Oncology

## 2019-05-19 NOTE — Telephone Encounter (Signed)
Scheduled per sch msg. Called and spoke with patients sister, [name]. Confirmed appts

## 2019-05-19 NOTE — Progress Notes (Signed)
Pharmacist Chemotherapy Monitoring - Initial Assessment    Anticipated start date: 05/21/19   Regimen:  . Are orders appropriate based on the patient's diagnosis, regimen, and cycle? Yes . Does the plan date match the patient's scheduled date? Yes . Is the sequencing of drugs appropriate? Yes . Are the premedications appropriate for the patient's regimen? N/A . Prior Authorization for treatment is: Approved o If applicable, is the correct biosimilar selected based on the patient's insurance? not applicable  Organ Function and Labs: Marland Kitchen Are dose adjustments needed based on the patient's renal function, hepatic function, or hematologic function? No . Are appropriate labs ordered prior to the start of patient's treatment? Yes . Other organ system assessment, if indicated: N/A . The following baseline labs, if indicated, have been ordered: pembrolizumab: baseline TSH +/- T4  Dose Assessment: . Are the drug doses appropriate? Yes . Are the following correct: o Drug concentrations Yes o IV fluid compatible with drug Yes o Administration routes Yes o Timing of therapy Yes . If applicable, does the patient have documented access for treatment and/or plans for port-a-cath placement? not applicable . If applicable, have lifetime cumulative doses been properly documented and assessed? not applicable Lifetime Dose Tracking  . Carboplatin: 1,490 mg = 0.01 % of the maximum lifetime dose of 999,999,999 mg  o   Toxicity Monitoring/Prevention: . The patient has the following take home antiemetics prescribed: Ondansetron . The patient has the following take home medications prescribed: N/A . Medication allergies and previous infusion related reactions, if applicable, have been reviewed and addressed. Yes . The patient's current medication list has been assessed for drug-drug interactions with their chemotherapy regimen. Dexamethasone taper noted on medication list - scheduled to end on 05/26/19.   Order  Review: . Are the treatment plan orders signed? Yes . Is the patient scheduled to see a provider prior to their treatment? Yes  I verify that I have reviewed each item in the above checklist and answered each question accordingly.  Micheal Vincent, PharmD, BCPS, [facility] Pharmacist Pharmacy Phone: [phone] 05/19/2019

## 2019-05-20 ENCOUNTER — Inpatient Hospital Stay: Payer: Medicaid Other | Attending: Hematology and Oncology | Admitting: Hematology and Oncology

## 2019-05-20 ENCOUNTER — Other Ambulatory Visit: Payer: Self-pay

## 2019-05-20 ENCOUNTER — Other Ambulatory Visit: Payer: Self-pay | Admitting: Medical

## 2019-05-20 ENCOUNTER — Encounter: Payer: Self-pay | Admitting: Hematology and Oncology

## 2019-05-20 ENCOUNTER — Other Ambulatory Visit: Payer: Self-pay | Admitting: Hematology and Oncology

## 2019-05-20 ENCOUNTER — Inpatient Hospital Stay: Payer: Medicaid Other

## 2019-05-20 VITALS — BP 154/105 | HR 85 | Temp 98.5°F | Resp 20 | Ht 72.0 in | Wt 178.9 lb

## 2019-05-20 DIAGNOSIS — M549 Dorsalgia, unspecified: Secondary | ICD-10-CM | POA: Diagnosis not present

## 2019-05-20 DIAGNOSIS — R55 Syncope and collapse: Secondary | ICD-10-CM | POA: Diagnosis not present

## 2019-05-20 DIAGNOSIS — R42 Dizziness and giddiness: Secondary | ICD-10-CM | POA: Diagnosis not present

## 2019-05-20 DIAGNOSIS — R471 Dysarthria and anarthria: Secondary | ICD-10-CM | POA: Insufficient documentation

## 2019-05-20 DIAGNOSIS — C7971 Secondary malignant neoplasm of right adrenal gland: Secondary | ICD-10-CM | POA: Insufficient documentation

## 2019-05-20 DIAGNOSIS — R519 Headache, unspecified: Secondary | ICD-10-CM | POA: Diagnosis not present

## 2019-05-20 DIAGNOSIS — R0781 Pleurodynia: Secondary | ICD-10-CM | POA: Diagnosis not present

## 2019-05-20 DIAGNOSIS — Z8249 Family history of ischemic heart disease and other diseases of the circulatory system: Secondary | ICD-10-CM | POA: Insufficient documentation

## 2019-05-20 DIAGNOSIS — C7951 Secondary malignant neoplasm of bone: Secondary | ICD-10-CM | POA: Diagnosis not present

## 2019-05-20 DIAGNOSIS — R112 Nausea with vomiting, unspecified: Secondary | ICD-10-CM | POA: Insufficient documentation

## 2019-05-20 DIAGNOSIS — Z7952 Long term (current) use of systemic steroids: Secondary | ICD-10-CM | POA: Insufficient documentation

## 2019-05-20 DIAGNOSIS — Z809 Family history of malignant neoplasm, unspecified: Secondary | ICD-10-CM | POA: Insufficient documentation

## 2019-05-20 DIAGNOSIS — Z79899 Other long term (current) drug therapy: Secondary | ICD-10-CM | POA: Insufficient documentation

## 2019-05-20 DIAGNOSIS — Z7189 Other specified counseling: Secondary | ICD-10-CM

## 2019-05-20 DIAGNOSIS — C3490 Malignant neoplasm of unspecified part of unspecified bronchus or lung: Secondary | ICD-10-CM | POA: Diagnosis not present

## 2019-05-20 DIAGNOSIS — K5900 Constipation, unspecified: Secondary | ICD-10-CM | POA: Diagnosis not present

## 2019-05-20 DIAGNOSIS — R590 Localized enlarged lymph nodes: Secondary | ICD-10-CM | POA: Diagnosis not present

## 2019-05-20 DIAGNOSIS — R1084 Generalized abdominal pain: Secondary | ICD-10-CM | POA: Insufficient documentation

## 2019-05-20 DIAGNOSIS — E871 Hypo-osmolality and hyponatremia: Secondary | ICD-10-CM | POA: Insufficient documentation

## 2019-05-20 DIAGNOSIS — R4182 Altered mental status, unspecified: Secondary | ICD-10-CM | POA: Diagnosis not present

## 2019-05-20 DIAGNOSIS — R509 Fever, unspecified: Secondary | ICD-10-CM | POA: Insufficient documentation

## 2019-05-20 DIAGNOSIS — R197 Diarrhea, unspecified: Secondary | ICD-10-CM | POA: Insufficient documentation

## 2019-05-20 DIAGNOSIS — C7949 Secondary malignant neoplasm of other parts of nervous system: Secondary | ICD-10-CM | POA: Diagnosis not present

## 2019-05-20 DIAGNOSIS — C3492 Malignant neoplasm of unspecified part of left bronchus or lung: Secondary | ICD-10-CM

## 2019-05-20 DIAGNOSIS — M5136 Other intervertebral disc degeneration, lumbar region: Secondary | ICD-10-CM | POA: Insufficient documentation

## 2019-05-20 DIAGNOSIS — C3412 Malignant neoplasm of upper lobe, left bronchus or lung: Secondary | ICD-10-CM | POA: Diagnosis present

## 2019-05-20 DIAGNOSIS — R634 Abnormal weight loss: Secondary | ICD-10-CM | POA: Insufficient documentation

## 2019-05-20 DIAGNOSIS — C7931 Secondary malignant neoplasm of brain: Secondary | ICD-10-CM | POA: Diagnosis not present

## 2019-05-20 DIAGNOSIS — D1803 Hemangioma of intra-abdominal structures: Secondary | ICD-10-CM | POA: Diagnosis not present

## 2019-05-20 DIAGNOSIS — Z833 Family history of diabetes mellitus: Secondary | ICD-10-CM | POA: Insufficient documentation

## 2019-05-20 DIAGNOSIS — F1290 Cannabis use, unspecified, uncomplicated: Secondary | ICD-10-CM | POA: Insufficient documentation

## 2019-05-20 DIAGNOSIS — C7900 Secondary malignant neoplasm of unspecified kidney and renal pelvis: Secondary | ICD-10-CM | POA: Insufficient documentation

## 2019-05-20 DIAGNOSIS — F1490 Cocaine use, unspecified, uncomplicated: Secondary | ICD-10-CM | POA: Insufficient documentation

## 2019-05-20 DIAGNOSIS — F17210 Nicotine dependence, cigarettes, uncomplicated: Secondary | ICD-10-CM | POA: Insufficient documentation

## 2019-05-20 DIAGNOSIS — I700 Atherosclerosis of aorta: Secondary | ICD-10-CM | POA: Diagnosis not present

## 2019-05-20 DIAGNOSIS — Z5112 Encounter for antineoplastic immunotherapy: Secondary | ICD-10-CM | POA: Diagnosis not present

## 2019-05-20 DIAGNOSIS — Z923 Personal history of irradiation: Secondary | ICD-10-CM | POA: Insufficient documentation

## 2019-05-20 LAB — CMP (CANCER CENTER ONLY)
ALT: 43 U/L (ref 0–44)
AST: 17 U/L (ref 15–41)
Albumin: 2.9 g/dL — ABNORMAL LOW (ref 3.5–5.0)
Alkaline Phosphatase: 89 U/L (ref 38–126)
Anion gap: 12 (ref 5–15)
BUN: 11 mg/dL (ref 6–20)
CO2: 26 mmol/L (ref 22–32)
Calcium: 9.3 mg/dL (ref 8.9–10.3)
Chloride: 99 mmol/L (ref 98–111)
Creatinine: 0.75 mg/dL (ref 0.61–1.24)
GFR, Est AFR Am: >60 mL/min (ref >60)
GFR, Estimated: >60 mL/min (ref >60)
Glucose, Bld: 130 mg/dL — ABNORMAL HIGH (ref 70–99)
Potassium: 4.5 mmol/L (ref 3.5–5.1)
Sodium: 137 mmol/L (ref 135–145)
Total Bilirubin: 0.4 mg/dL (ref 0.3–1.2)
Total Protein: 7.5 g/dL (ref 6.5–8.1)

## 2019-05-20 LAB — CBC WITH DIFFERENTIAL (CANCER CENTER ONLY)
Abs Immature Granulocytes: 0.34 10*3/uL — ABNORMAL HIGH (ref 0.00–0.07)
Basophils Absolute: 0.1 10*3/uL (ref 0.0–0.1)
Basophils Relative: 1 %
Eosinophils Absolute: 0.3 10*3/uL (ref 0.0–0.5)
Eosinophils Relative: 4 %
HCT: 39.3 % (ref 39.0–52.0)
Hemoglobin: 12.2 g/dL — ABNORMAL LOW (ref 13.0–17.0)
Immature Granulocytes: 6 %
Lymphocytes Relative: 5 %
Lymphs Abs: 0.3 10*3/uL — ABNORMAL LOW (ref 0.7–4.0)
MCH: 29.1 pg (ref 26.0–34.0)
MCHC: 31 g/dL (ref 30.0–36.0)
MCV: 93.8 fL (ref 80.0–100.0)
Monocytes Absolute: 0.4 10*3/uL (ref 0.1–1.0)
Monocytes Relative: 7 %
Neutro Abs: 4.7 10*3/uL (ref 1.7–7.7)
Neutrophils Relative %: 77 %
Platelet Count: 136 10*3/uL — ABNORMAL LOW (ref 150–400)
RBC: 4.19 MIL/uL — ABNORMAL LOW (ref 4.22–5.81)
RDW: 22.6 % — ABNORMAL HIGH (ref 11.5–15.5)
WBC Count: 6 10*3/uL (ref 4.0–10.5)
nRBC: 0.5 % — ABNORMAL HIGH (ref 0.0–0.2)

## 2019-05-20 MED ORDER — OXYCODONE HCL 5 MG PO TABS: 5.0000 mg | ORAL_TABLET | Freq: Four times a day (QID) | ORAL | 0 refills | Status: DC | PRN

## 2019-05-20 MED ORDER — OXYCODONE HCL 5 MG PO TABS: 5.0000 mg | ORAL_TABLET | Freq: Four times a day (QID) | ORAL | 0 refills | Status: AC | PRN

## 2019-05-20 MED FILL — oxyCODONE HCL 5 MG TABS: 5 | 8 days supply | Qty: 60 | Fill #0

## 2019-05-20 NOTE — Progress Notes (Signed)
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  --interval brain metastasis noted on MRI from 04/22/2019. These were treated with SRS. --RTC 06/11/2019 presumably to start of Cycle 2 of pembrolizumab monotherapy  #Weight Loss, worsening --established with dietary service, appreciate their recommendations.  --mirtazepine for sleep/appetite stimulation.  --patient has gained 6 lbs since his last visit on 05/12/2019 (possible steroid effect)   #Symptom Management --headache 6/10 in severity, dexamethasone therapy has been helpful. Will prescribe oxycodone [id number] and request referrral to Dr. VaMickeal Skinneror evaluation of his brain mets.   --dexamethasone taper per rad/onc recommendations. Patient currently taking 50m39mex in the AM and 2 mg dex in the PM --insominia: patient self d/c mirtazepine 7.5 mg daily   #Nausea/Vomiting #Diarrhea --nausea/vomiting have resolved from last visit.  --diarrhea has resolved.  --recommend patient take PRN medications as needed   #Active Smoker --patient is a current active smoker,  interested in smoking cessation.  --provided with resources to help with quitting  at last visit --continue to encourage smoking cessation.    #  Illict Drug Use --patient used to smoke cocaine, though he has d/c drug use and has been clean for several months prior to starting therapy.  --continue to monitor  No orders of the defined types were placed in this encounter.  All questions were answered. The patient knows to call the clinic with any problems, questions or concerns.  A total of more than 30 minutes were spent on this encounter and over half of that time was spent on counseling and coordination of care as outlined above.   Ledell Peoples, MD Department of Hematology/Oncology [place] at [facility] Phone: [phone] Pager: [phone] Email: [email or web address]  05/20/2019 6:26 PM

## 2019-05-20 NOTE — Progress Notes (Signed)
sh

## 2019-05-21 ENCOUNTER — Inpatient Hospital Stay: Payer: Medicaid Other

## 2019-05-21 ENCOUNTER — Other Ambulatory Visit: Payer: Self-pay

## 2019-05-21 ENCOUNTER — Telehealth: Payer: Self-pay | Admitting: Hematology and Oncology

## 2019-05-21 ENCOUNTER — Other Ambulatory Visit: Payer: Self-pay | Admitting: Hematology and Oncology

## 2019-05-21 VITALS — BP 157/90 | HR 81 | Temp 98.1°F | Resp 16

## 2019-05-21 DIAGNOSIS — Z5112 Encounter for antineoplastic immunotherapy: Secondary | ICD-10-CM | POA: Diagnosis not present

## 2019-05-21 DIAGNOSIS — C3490 Malignant neoplasm of unspecified part of unspecified bronchus or lung: Secondary | ICD-10-CM

## 2019-05-21 MED ORDER — SODIUM CHLORIDE 0.9 % IV SOLN
200.0000 mg | Freq: Once | INTRAVENOUS | Status: AC
  Administered 2019-05-21: 200 mg via INTRAVENOUS
  Filled 2019-05-21: qty 8

## 2019-05-21 MED ORDER — SODIUM CHLORIDE 0.9% FLUSH
10.0000 mL | INTRAVENOUS | Status: DC | PRN
  Administered 2019-05-21: 10 mL
  Filled 2019-05-21: qty 10

## 2019-05-21 MED ORDER — SODIUM CHLORIDE 0.9 % IV SOLN
Freq: Once | INTRAVENOUS | Status: AC
  Filled 2019-05-21: qty 250

## 2019-05-21 MED ORDER — HEPARIN SOD (PORK) LOCK FLUSH 100 UNIT/ML IV SOLN
500.0000 [IU] | Freq: Once | INTRAVENOUS | Status: AC | PRN
  Administered 2019-05-21: 500 [IU]
  Filled 2019-05-21: qty 5

## 2019-05-21 NOTE — Telephone Encounter (Signed)
Scheduled per los. Gave printout

## 2019-05-21 NOTE — Patient Instructions (Signed)
[facility] Discharge Instructions for Patients Receiving Chemotherapy  Today you received the following chemotherapy agents: pembrolizumab.  To help prevent nausea and vomiting after your treatment, we encourage you to take your nausea medication as directed.   If you develop nausea and vomiting that is not controlled by your nausea medication, call the clinic.   BELOW ARE SYMPTOMS THAT SHOULD BE REPORTED IMMEDIATELY:  *FEVER GREATER THAN 100.5 F  *CHILLS WITH OR WITHOUT FEVER  NAUSEA AND VOMITING THAT IS NOT CONTROLLED WITH YOUR NAUSEA MEDICATION  *UNUSUAL SHORTNESS OF BREATH  *UNUSUAL BRUISING OR BLEEDING  TENDERNESS IN MOUTH AND THROAT WITH OR WITHOUT PRESENCE OF ULCERS  *URINARY PROBLEMS  *BOWEL PROBLEMS  UNUSUAL RASH Items with * indicate a potential emergency and should be followed up as soon as possible.  Feel free to call the clinic should you have any questions or concerns. The clinic phone number is [phone].  Please show the CHEMO ALERT CARD at check-in to the Emergency Department and triage nurse.   

## 2019-05-22 ENCOUNTER — Other Ambulatory Visit: Payer: Self-pay

## 2019-05-22 ENCOUNTER — Encounter (HOSPITAL_COMMUNITY): Payer: Self-pay

## 2019-05-22 ENCOUNTER — Emergency Department (HOSPITAL_COMMUNITY): Payer: Medicaid Other

## 2019-05-22 ENCOUNTER — Emergency Department (HOSPITAL_COMMUNITY)
Admission: EM | Admit: 2019-05-22 | Discharge: 2019-05-22 | Disposition: A | Discharge status: Home / Self Care | Payer: Medicaid Other | Attending: Emergency Medicine | Admitting: Emergency Medicine

## 2019-05-22 DIAGNOSIS — F17210 Nicotine dependence, cigarettes, uncomplicated: Secondary | ICD-10-CM | POA: Diagnosis not present

## 2019-05-22 DIAGNOSIS — C3490 Malignant neoplasm of unspecified part of unspecified bronchus or lung: Secondary | ICD-10-CM | POA: Diagnosis not present

## 2019-05-22 DIAGNOSIS — Z79899 Other long term (current) drug therapy: Secondary | ICD-10-CM | POA: Insufficient documentation

## 2019-05-22 DIAGNOSIS — G936 Cerebral edema: Secondary | ICD-10-CM | POA: Diagnosis not present

## 2019-05-22 DIAGNOSIS — R519 Headache, unspecified: Secondary | ICD-10-CM | POA: Diagnosis not present

## 2019-05-22 DIAGNOSIS — C7931 Secondary malignant neoplasm of brain: Secondary | ICD-10-CM | POA: Diagnosis not present

## 2019-05-22 LAB — COMPREHENSIVE METABOLIC PANEL
ALT: 38 U/L (ref 0–44)
AST: 17 U/L (ref 15–41)
Albumin: 3.2 g/dL — ABNORMAL LOW (ref 3.5–5.0)
Alkaline Phosphatase: 71 U/L (ref 38–126)
Anion gap: 11 (ref 5–15)
BUN: 12 mg/dL (ref 6–20)
CO2: 26 mmol/L (ref 22–32)
Calcium: 8.8 mg/dL — ABNORMAL LOW (ref 8.9–10.3)
Chloride: 91 mmol/L — ABNORMAL LOW (ref 98–111)
Creatinine, Ser: 0.65 mg/dL (ref 0.61–1.24)
GFR calc Af Amer: >60 mL/min (ref >60)
GFR calc non Af Amer: >60 mL/min (ref >60)
Glucose, Bld: 109 mg/dL — ABNORMAL HIGH (ref 70–99)
Potassium: 3.8 mmol/L (ref 3.5–5.1)
Sodium: 128 mmol/L — ABNORMAL LOW (ref 135–145)
Total Bilirubin: 0.8 mg/dL (ref 0.3–1.2)
Total Protein: 7 g/dL (ref 6.5–8.1)

## 2019-05-22 LAB — CBC WITH DIFFERENTIAL/PLATELET
Abs Immature Granulocytes: 0.22 10*3/uL — ABNORMAL HIGH (ref 0.00–0.07)
Basophils Absolute: 0 10*3/uL (ref 0.0–0.1)
Basophils Relative: 0 %
Eosinophils Absolute: 0 10*3/uL (ref 0.0–0.5)
Eosinophils Relative: 0 %
HCT: 37.3 % — ABNORMAL LOW (ref 39.0–52.0)
Hemoglobin: 12 g/dL — ABNORMAL LOW (ref 13.0–17.0)
Immature Granulocytes: 3 %
Lymphocytes Relative: 4 %
Lymphs Abs: 0.3 10*3/uL — ABNORMAL LOW (ref 0.7–4.0)
MCH: 30 pg (ref 26.0–34.0)
MCHC: 32.2 g/dL (ref 30.0–36.0)
MCV: 93.3 fL (ref 80.0–100.0)
Monocytes Absolute: 0.5 10*3/uL (ref 0.1–1.0)
Monocytes Relative: 7 %
Neutro Abs: 6.3 10*3/uL (ref 1.7–7.7)
Neutrophils Relative %: 86 %
Platelets: 142 10*3/uL — ABNORMAL LOW (ref 150–400)
RBC: 4 MIL/uL — ABNORMAL LOW (ref 4.22–5.81)
RDW: 21.6 % — ABNORMAL HIGH (ref 11.5–15.5)
WBC: 7.4 10*3/uL (ref 4.0–10.5)
nRBC: 0 % (ref 0.0–0.2)

## 2019-05-22 MED ORDER — DEXAMETHASONE SODIUM PHOSPHATE 10 MG/ML IJ SOLN
10.0000 mg | Freq: Once | INTRAMUSCULAR | Status: AC
  Administered 2019-05-22: 10 mg via INTRAVENOUS
  Filled 2019-05-22: qty 1

## 2019-05-22 MED ORDER — SODIUM CHLORIDE 0.9 % IV SOLN: INTRAVENOUS | Status: DC

## 2019-05-22 MED ORDER — METOCLOPRAMIDE HCL 5 MG/ML IJ SOLN
10.0000 mg | Freq: Once | INTRAMUSCULAR | Status: AC
  Administered 2019-05-22: 10 mg via INTRAVENOUS
  Filled 2019-05-22: qty 2

## 2019-05-22 MED ORDER — MORPHINE SULFATE (PF) 4 MG/ML IV SOLN
4.0000 mg | Freq: Once | INTRAVENOUS | Status: AC
  Administered 2019-05-22: 4 mg via INTRAVENOUS
  Filled 2019-05-22: qty 1

## 2019-05-22 MED ORDER — HEPARIN SOD (PORK) LOCK FLUSH 100 UNIT/ML IV SOLN
500.0000 [IU] | Freq: Once | INTRAVENOUS | Status: AC
  Administered 2019-05-22: 500 [IU]
  Filled 2019-05-22: qty 5

## 2019-05-22 NOTE — ED Triage Notes (Signed)
Patient presented to ED with c/o generalized weakness, headache, abdominal pain in the past two days. He is a cancer patient last chemo infusion was yesterday.

## 2019-05-22 NOTE — ED Notes (Signed)
Urine culture sent down to lab with urinalysis. 
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
## 2019-05-22 NOTE — Discharge Instructions (Signed)
The CAT scan today showed that you had some area with a little worsening swelling but other areas that were better or the same.  Lab work showed some mild decrease sodium levels.  Oncologist wants you to increase your steroid pill Decadron to 8mg  3 times a day which should also help with the headaches.  Also you need to restrict your water intake to 1 quart a day.  If symptoms worsen between now and Monday please return to the emergency room.  No signs of infection today and you can continue to take your hydrocodone at home for headache.

## 2019-05-23 ENCOUNTER — Other Ambulatory Visit: Payer: Self-pay | Admitting: Oncology

## 2019-05-23 NOTE — Progress Notes (Signed)
Called today to follow-up on phone call from ED yesterday. Patient is staying at his sister's [phone]) and she says he is feeling much better since steroids increased. He is not now afebrile. They are following fluid restriction instructions and he is drinking less than a quart /day.  I will set him up for labs and a visit with our acute clinic PA tomorrow

## 2019-05-24 ENCOUNTER — Inpatient Hospital Stay (HOSPITAL_BASED_OUTPATIENT_CLINIC_OR_DEPARTMENT_OTHER): Payer: Medicaid Other | Admitting: Medical

## 2019-05-24 ENCOUNTER — Ambulatory Visit (HOSPITAL_COMMUNITY)
Admission: RE | Admit: 2019-05-24 | Discharge: 2019-05-24 | Disposition: A | Discharge status: Home / Self Care | Payer: Medicaid Other | Source: Ambulatory Visit | Attending: Medical | Admitting: Medical

## 2019-05-24 ENCOUNTER — Telehealth: Payer: Self-pay | Admitting: Oncology

## 2019-05-24 ENCOUNTER — Other Ambulatory Visit: Payer: Self-pay | Admitting: Emergency Medicine

## 2019-05-24 ENCOUNTER — Other Ambulatory Visit: Payer: Self-pay

## 2019-05-24 ENCOUNTER — Inpatient Hospital Stay: Payer: Medicaid Other

## 2019-05-24 VITALS — BP 149/102 | HR 76 | Temp 98.5°F | Resp 20 | Ht 72.0 in | Wt 171.8 lb

## 2019-05-24 DIAGNOSIS — R1084 Generalized abdominal pain: Secondary | ICD-10-CM | POA: Diagnosis not present

## 2019-05-24 DIAGNOSIS — C3490 Malignant neoplasm of unspecified part of unspecified bronchus or lung: Secondary | ICD-10-CM

## 2019-05-24 DIAGNOSIS — E871 Hypo-osmolality and hyponatremia: Secondary | ICD-10-CM

## 2019-05-24 DIAGNOSIS — C7949 Secondary malignant neoplasm of other parts of nervous system: Secondary | ICD-10-CM

## 2019-05-24 DIAGNOSIS — C7931 Secondary malignant neoplasm of brain: Secondary | ICD-10-CM

## 2019-05-24 DIAGNOSIS — Z5112 Encounter for antineoplastic immunotherapy: Secondary | ICD-10-CM | POA: Diagnosis not present

## 2019-05-24 LAB — CMP (CANCER CENTER ONLY)
ALT: 38 U/L (ref 0–44)
AST: 14 U/L — ABNORMAL LOW (ref 15–41)
Albumin: 2.7 g/dL — ABNORMAL LOW (ref 3.5–5.0)
Alkaline Phosphatase: 82 U/L (ref 38–126)
Anion gap: 13 (ref 5–15)
BUN: 13 mg/dL (ref 6–20)
CO2: 25 mmol/L (ref 22–32)
Calcium: 9.6 mg/dL (ref 8.9–10.3)
Chloride: 98 mmol/L (ref 98–111)
Creatinine: 0.76 mg/dL (ref 0.61–1.24)
GFR, Est AFR Am: >60 mL/min (ref >60)
GFR, Estimated: >60 mL/min (ref >60)
Glucose, Bld: 123 mg/dL — ABNORMAL HIGH (ref 70–99)
Potassium: 4.5 mmol/L (ref 3.5–5.1)
Sodium: 136 mmol/L (ref 135–145)
Total Bilirubin: 0.4 mg/dL (ref 0.3–1.2)
Total Protein: 7.3 g/dL (ref 6.5–8.1)

## 2019-05-24 LAB — CBC WITH DIFFERENTIAL (CANCER CENTER ONLY)
Abs Immature Granulocytes: 0.2 10*3/uL — ABNORMAL HIGH (ref 0.00–0.07)
Basophils Absolute: 0 10*3/uL (ref 0.0–0.1)
Basophils Relative: 0 %
Eosinophils Absolute: 0 10*3/uL (ref 0.0–0.5)
Eosinophils Relative: 0 %
HCT: 37.9 % — ABNORMAL LOW (ref 39.0–52.0)
Hemoglobin: 12.5 g/dL — ABNORMAL LOW (ref 13.0–17.0)
Immature Granulocytes: 2 %
Lymphocytes Relative: 3 %
Lymphs Abs: 0.4 10*3/uL — ABNORMAL LOW (ref 0.7–4.0)
MCH: 30.6 pg (ref 26.0–34.0)
MCHC: 33 g/dL (ref 30.0–36.0)
MCV: 92.9 fL (ref 80.0–100.0)
Monocytes Absolute: 0.7 10*3/uL (ref 0.1–1.0)
Monocytes Relative: 7 %
Neutro Abs: 9.3 10*3/uL — ABNORMAL HIGH (ref 1.7–7.7)
Neutrophils Relative %: 88 %
Platelet Count: 162 10*3/uL (ref 150–400)
RBC: 4.08 MIL/uL — ABNORMAL LOW (ref 4.22–5.81)
RDW: 21.3 % — ABNORMAL HIGH (ref 11.5–15.5)
WBC Count: 10.6 10*3/uL — ABNORMAL HIGH (ref 4.0–10.5)
nRBC: 0 % (ref 0.0–0.2)

## 2019-05-24 LAB — MAGNESIUM: Magnesium: 1.9 mg/dL (ref 1.7–2.4)

## 2019-05-24 MED ORDER — ALUM & MAG HYDROXIDE-SIMETH 200-200-20 MG/5ML PO SUSP
ORAL | Status: AC
  Filled 2019-05-24: qty 30

## 2019-05-24 MED ORDER — LIDOCAINE VISCOUS HCL 2 % MT SOLN
OROMUCOSAL | Status: AC
  Filled 2019-05-24: qty 15

## 2019-05-24 MED ORDER — SIMETHICONE 40 MG/0.6ML PO SUSP: 40.0000 mg | Freq: Four times a day (QID) | ORAL | 3 refills | Status: AC | PRN

## 2019-05-24 MED ORDER — LIDOCAINE VISCOUS HCL 2 % MT SOLN
15.0000 mL | Freq: Once | OROMUCOSAL | Status: AC
  Administered 2019-05-24: 15 mL via ORAL

## 2019-05-24 MED ORDER — ALUM & MAG HYDROXIDE-SIMETH 200-200-20 MG/5ML PO SUSP
30.0000 mL | Freq: Once | ORAL | Status: AC
  Administered 2019-05-24: 30 mL via ORAL

## 2019-05-24 MED ORDER — MORPHINE SULFATE ER 15 MG PO TBCR: 15.0000 mg | EXTENDED_RELEASE_TABLET | Freq: Two times a day (BID) | ORAL | 0 refills | Status: DC

## 2019-05-24 MED FILL — MORPHINE SULF ER 15 MG TAB: 15 | 30 days supply | Qty: 60 | Fill #0

## 2019-05-24 NOTE — Progress Notes (Signed)
Pt returned to [facility] lobby after scan to wait for results.  PA Lucianne Lei aware, will speak w/patient in lobby about results before pt is d/c home.

## 2019-05-24 NOTE — Patient Instructions (Addendum)
Constipation Management  Magnesium Citrate, drink 1/2 bottle, drink remainder if no bowel movement with 30 to 60 minutes  Or  30 mg (1 tablespoon) of Milk of Magnesia in 8 ounces of prune juice, warm in microwave for 20 seconds    Begin the following after you have had a bowel movement:  Senna-S, 1 to 2 tablets twice daily  MiraLAX 17 grams in 8 ounces of liquids 1 to 2 times daily as needed   Remember to remain well hydrated. Drink, Drink, Drink non-caffeinated beverages.   Adjust these medications based on your response. If your bowel movements become too loose then decrease the amount of Senna-S and/or MiraLAX that you are using. If your bowel movements become too firm or are difficult to pass, the increase the amount of Senna-S and/or MiraLAX that you are using and increase your intake of water.    COVID-19: How to Protect Yourself and Others Know how it spreads  There is currently no vaccine to prevent coronavirus disease 2019 (COVID-19).  The best way to prevent illness is to avoid being exposed to this virus.  The virus is thought to spread mainly from person-to-person. ? Between people who are in close contact with one another (within about 6 feet). ? Through respiratory droplets produced when an infected person coughs, sneezes or talks. ? These droplets can land in the mouths or noses of people who are nearby or possibly be inhaled into the lungs. ? COVID-19 may be spread by people who are not showing symptoms. Everyone should Clean your hands often  Wash your hands often with soap and water for at least 20 seconds especially after you have been in a public place, or after blowing your nose, coughing, or sneezing.  If soap and water are not readily available, use a hand sanitizer that contains at least 60% alcohol. Cover all surfaces of your hands and rub them together until they feel dry.  Avoid touching your eyes, nose, and mouth with unwashed hands. Avoid close  contact  Limit contact with others as much as possible.  Avoid close contact with people who are sick.  Put distance between yourself and other people. ? Remember that some people without symptoms may be able to spread virus. ? This is especially important for people who are at higher risk of getting very [employer]ain[employer].com.cy Cover your mouth and nose with a mask when around others  You could spread COVID-19 to others even if you do not feel sick.  Everyone should wear a mask in public settings and when around people not living in their household, especially when social distancing is difficult to maintain. ? Masks should not be placed on young children under age 110, anyone who has trouble breathing, or is unconscious, incapacitated or otherwise unable to remove the mask without assistance.  The mask is meant to protect other people in case you are infected.  Do NOT use a facemask meant for a Dietitian.  Continue to keep about 6 feet between yourself and others. The mask is not a substitute for social distancing. Cover coughs and sneezes  Always cover your mouth and nose with a tissue when you cough or sneeze or use the inside of your elbow.  Throw used tissues in the trash.  Immediately wash your hands with soap and water for at least 20 seconds. If soap and water are not readily available, clean your hands with a hand sanitizer that contains at least 60% alcohol. Clean and disinfect  Clean AND disinfect frequently touched surfaces daily. This includes tables, doorknobs, light switches, countertops, handles, desks, phones, keyboards, toilets, faucets, and sinks. [employer]fr  If surfaces are dirty, clean them: Use detergent or soap and water prior to disinfection.  Then, use a household disinfectant. You can see a list of EPA-registered  household disinfectants here. michellinders.com 10/21/2018 This information is not intended to replace advice given to you by your health care provider. Make sure you discuss any questions you have with your health care provider. Document Revised: 10/29/2018 Document Reviewed: 08/27/2018 Elsevier Patient Education  [place].
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
## 2019-05-24 NOTE — Telephone Encounter (Signed)
Scheduled appt per 4/4 sch message - pt sister aware of appts added for today

## 2019-05-25 ENCOUNTER — Other Ambulatory Visit: Payer: Self-pay

## 2019-05-25 ENCOUNTER — Inpatient Hospital Stay (HOSPITAL_BASED_OUTPATIENT_CLINIC_OR_DEPARTMENT_OTHER): Payer: Medicaid Other | Admitting: Internal Medicine

## 2019-05-25 VITALS — BP 130/100 | HR 76 | Temp 98.7°F | Resp 17 | Ht 72.0 in | Wt 172.0 lb

## 2019-05-25 DIAGNOSIS — Z5112 Encounter for antineoplastic immunotherapy: Secondary | ICD-10-CM | POA: Diagnosis not present

## 2019-05-25 DIAGNOSIS — C7931 Secondary malignant neoplasm of brain: Secondary | ICD-10-CM

## 2019-05-25 DIAGNOSIS — C7949 Secondary malignant neoplasm of other parts of nervous system: Secondary | ICD-10-CM

## 2019-05-25 MED ORDER — DEXAMETHASONE 4 MG PO TABS: 4.0000 mg | ORAL_TABLET | Freq: Every day | ORAL | Status: DC
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
## 2019-05-26 ENCOUNTER — Telehealth: Payer: Self-pay | Admitting: *Deleted

## 2019-05-26 ENCOUNTER — Other Ambulatory Visit: Payer: Self-pay

## 2019-05-26 ENCOUNTER — Inpatient Hospital Stay (HOSPITAL_BASED_OUTPATIENT_CLINIC_OR_DEPARTMENT_OTHER): Payer: Medicaid Other | Admitting: Medical

## 2019-05-26 ENCOUNTER — Encounter: Payer: Self-pay | Admitting: *Deleted

## 2019-05-26 ENCOUNTER — Other Ambulatory Visit: Payer: Medicaid Other

## 2019-05-26 ENCOUNTER — Other Ambulatory Visit: Payer: Self-pay | Admitting: Medical

## 2019-05-26 ENCOUNTER — Telehealth: Payer: Self-pay | Admitting: Internal Medicine

## 2019-05-26 ENCOUNTER — Ambulatory Visit: Payer: Medicaid Other

## 2019-05-26 ENCOUNTER — Encounter: Payer: Self-pay | Admitting: Nutrition

## 2019-05-26 ENCOUNTER — Inpatient Hospital Stay: Payer: Medicaid Other | Admitting: Nutrition

## 2019-05-26 VITALS — BP 153/101 | HR 78 | Temp 99.1°F | Resp 17 | Ht 72.0 in | Wt 172.4 lb

## 2019-05-26 DIAGNOSIS — C7949 Secondary malignant neoplasm of other parts of nervous system: Secondary | ICD-10-CM

## 2019-05-26 DIAGNOSIS — R319 Hematuria, unspecified: Secondary | ICD-10-CM

## 2019-05-26 DIAGNOSIS — C3412 Malignant neoplasm of upper lobe, left bronchus or lung: Secondary | ICD-10-CM

## 2019-05-26 DIAGNOSIS — C7931 Secondary malignant neoplasm of brain: Secondary | ICD-10-CM

## 2019-05-26 DIAGNOSIS — C3490 Malignant neoplasm of unspecified part of unspecified bronchus or lung: Secondary | ICD-10-CM

## 2019-05-26 LAB — URINALYSIS, COMPLETE (UACMP) WITH MICROSCOPIC
Bacteria, UA: NONE SEEN
Bilirubin Urine: NEGATIVE
Glucose, UA: NEGATIVE mg/dL
Ketones, ur: NEGATIVE mg/dL
Leukocytes,Ua: NEGATIVE
Nitrite: NEGATIVE
Protein, ur: NEGATIVE mg/dL
RBC / HPF: >50 RBC/hpf — ABNORMAL HIGH (ref 0–5)
Specific Gravity, Urine: 1.016 (ref 1.005–1.030)
pH: 6 (ref 5.0–8.0)

## 2019-05-26 LAB — CBC WITH DIFFERENTIAL (CANCER CENTER ONLY)
Abs Immature Granulocytes: 0.3 10*3/uL — ABNORMAL HIGH (ref 0.00–0.07)
Basophils Absolute: 0 10*3/uL (ref 0.0–0.1)
Basophils Relative: 0 %
Eosinophils Absolute: 0 10*3/uL (ref 0.0–0.5)
Eosinophils Relative: 0 %
HCT: 38 % — ABNORMAL LOW (ref 39.0–52.0)
Hemoglobin: 12.3 g/dL — ABNORMAL LOW (ref 13.0–17.0)
Immature Granulocytes: 3 %
Lymphocytes Relative: 3 %
Lymphs Abs: 0.3 10*3/uL — ABNORMAL LOW (ref 0.7–4.0)
MCH: 30.1 pg (ref 26.0–34.0)
MCHC: 32.4 g/dL (ref 30.0–36.0)
MCV: 93.1 fL (ref 80.0–100.0)
Monocytes Absolute: 0.7 10*3/uL (ref 0.1–1.0)
Monocytes Relative: 7 %
Neutro Abs: 8.9 10*3/uL — ABNORMAL HIGH (ref 1.7–7.7)
Neutrophils Relative %: 87 %
Platelet Count: 145 10*3/uL — ABNORMAL LOW (ref 150–400)
RBC: 4.08 MIL/uL — ABNORMAL LOW (ref 4.22–5.81)
RDW: 20.8 % — ABNORMAL HIGH (ref 11.5–15.5)
WBC Count: 10.3 10*3/uL (ref 4.0–10.5)
nRBC: 0 % (ref 0.0–0.2)

## 2019-05-26 NOTE — Patient Instructions (Signed)

## 2019-05-26 NOTE — Telephone Encounter (Signed)
Scheduled appt per 4/6 los 

## 2019-05-26 NOTE — Progress Notes (Signed)
Pt seen by PA Lucianne Lei only, no assessment by [facility] RN at this time.  PA aware.

## 2019-05-26 NOTE — Progress Notes (Signed)
These results were reviewed with the patient and his sister.

## 2019-05-26 NOTE — Telephone Encounter (Signed)
Called to request services to [facility].  Declined acceptance as they don't have a speech therapist yet and because the patient has MCD.  Faxed request to [facility], pending response as to if they can accept patient referral.

## 2019-05-26 NOTE — Progress Notes (Signed)
Provided patient with 1 complementary case of Ensure Enlive.

## 2019-05-27 ENCOUNTER — Other Ambulatory Visit: Payer: Self-pay

## 2019-05-27 ENCOUNTER — Emergency Department (HOSPITAL_COMMUNITY): Payer: Medicaid Other

## 2019-05-27 ENCOUNTER — Telehealth: Payer: Self-pay | Admitting: Radiation Oncology

## 2019-05-27 ENCOUNTER — Telehealth: Payer: Self-pay | Admitting: *Deleted

## 2019-05-27 ENCOUNTER — Inpatient Hospital Stay (HOSPITAL_COMMUNITY)
Admission: EM | Admit: 2019-05-27 | Discharge: 2019-05-29 | DRG: 055 | Disposition: A | Discharge status: Hospice | Payer: Medicaid Other | Attending: Internal Medicine | Admitting: Internal Medicine

## 2019-05-27 ENCOUNTER — Encounter (HOSPITAL_COMMUNITY): Payer: Self-pay | Admitting: Emergency Medicine

## 2019-05-27 DIAGNOSIS — Z6823 Body mass index (BMI) 23.0-23.9, adult: Secondary | ICD-10-CM

## 2019-05-27 DIAGNOSIS — Z9221 Personal history of antineoplastic chemotherapy: Secondary | ICD-10-CM | POA: Diagnosis not present

## 2019-05-27 DIAGNOSIS — C7949 Secondary malignant neoplasm of other parts of nervous system: Secondary | ICD-10-CM | POA: Diagnosis present

## 2019-05-27 DIAGNOSIS — K5900 Constipation, unspecified: Secondary | ICD-10-CM | POA: Diagnosis present

## 2019-05-27 DIAGNOSIS — Z7982 Long term (current) use of aspirin: Secondary | ICD-10-CM

## 2019-05-27 DIAGNOSIS — R918 Other nonspecific abnormal finding of lung field: Secondary | ICD-10-CM | POA: Diagnosis present

## 2019-05-27 DIAGNOSIS — R400 Somnolence: Secondary | ICD-10-CM | POA: Diagnosis not present

## 2019-05-27 DIAGNOSIS — R29810 Facial weakness: Secondary | ICD-10-CM | POA: Diagnosis present

## 2019-05-27 DIAGNOSIS — C7931 Secondary malignant neoplasm of brain: Principal | ICD-10-CM | POA: Diagnosis present

## 2019-05-27 DIAGNOSIS — Z833 Family history of diabetes mellitus: Secondary | ICD-10-CM | POA: Diagnosis not present

## 2019-05-27 DIAGNOSIS — F419 Anxiety disorder, unspecified: Secondary | ICD-10-CM | POA: Diagnosis present

## 2019-05-27 DIAGNOSIS — R5383 Other fatigue: Secondary | ICD-10-CM

## 2019-05-27 DIAGNOSIS — E871 Hypo-osmolality and hyponatremia: Secondary | ICD-10-CM

## 2019-05-27 DIAGNOSIS — Z515 Encounter for palliative care: Secondary | ICD-10-CM | POA: Diagnosis not present

## 2019-05-27 DIAGNOSIS — C3412 Malignant neoplasm of upper lobe, left bronchus or lung: Secondary | ICD-10-CM | POA: Diagnosis present

## 2019-05-27 DIAGNOSIS — R627 Adult failure to thrive: Secondary | ICD-10-CM | POA: Diagnosis present

## 2019-05-27 DIAGNOSIS — R471 Dysarthria and anarthria: Secondary | ICD-10-CM | POA: Diagnosis present

## 2019-05-27 DIAGNOSIS — F17210 Nicotine dependence, cigarettes, uncomplicated: Secondary | ICD-10-CM | POA: Diagnosis present

## 2019-05-27 DIAGNOSIS — Z923 Personal history of irradiation: Secondary | ICD-10-CM | POA: Diagnosis not present

## 2019-05-27 DIAGNOSIS — G893 Neoplasm related pain (acute) (chronic): Secondary | ICD-10-CM | POA: Diagnosis present

## 2019-05-27 DIAGNOSIS — Z8249 Family history of ischemic heart disease and other diseases of the circulatory system: Secondary | ICD-10-CM | POA: Diagnosis not present

## 2019-05-27 DIAGNOSIS — R5381 Other malaise: Secondary | ICD-10-CM | POA: Diagnosis present

## 2019-05-27 DIAGNOSIS — Z66 Do not resuscitate: Secondary | ICD-10-CM | POA: Diagnosis present

## 2019-05-27 DIAGNOSIS — E222 Syndrome of inappropriate secretion of antidiuretic hormone: Secondary | ICD-10-CM | POA: Diagnosis present

## 2019-05-27 DIAGNOSIS — Z79899 Other long term (current) drug therapy: Secondary | ICD-10-CM

## 2019-05-27 DIAGNOSIS — Z20822 Contact with and (suspected) exposure to covid-19: Secondary | ICD-10-CM | POA: Diagnosis present

## 2019-05-27 DIAGNOSIS — Z79891 Long term (current) use of opiate analgesic: Secondary | ICD-10-CM

## 2019-05-27 DIAGNOSIS — C3490 Malignant neoplasm of unspecified part of unspecified bronchus or lung: Secondary | ICD-10-CM | POA: Diagnosis present

## 2019-05-27 DIAGNOSIS — G9389 Other specified disorders of brain: Secondary | ICD-10-CM

## 2019-05-27 LAB — CBC WITH DIFFERENTIAL/PLATELET
Abs Immature Granulocytes: 0.59 10*3/uL — ABNORMAL HIGH (ref 0.00–0.07)
Basophils Absolute: 0.1 10*3/uL (ref 0.0–0.1)
Basophils Relative: 0 %
Eosinophils Absolute: 0 10*3/uL (ref 0.0–0.5)
Eosinophils Relative: 0 %
HCT: 38.7 % — ABNORMAL LOW (ref 39.0–52.0)
Hemoglobin: 12.5 g/dL — ABNORMAL LOW (ref 13.0–17.0)
Immature Granulocytes: 5 %
Lymphocytes Relative: 3 %
Lymphs Abs: 0.4 10*3/uL — ABNORMAL LOW (ref 0.7–4.0)
MCH: 29.8 pg (ref 26.0–34.0)
MCHC: 32.3 g/dL (ref 30.0–36.0)
MCV: 92.4 fL (ref 80.0–100.0)
Monocytes Absolute: 0.8 10*3/uL (ref 0.1–1.0)
Monocytes Relative: 6 %
Neutro Abs: 10.5 10*3/uL — ABNORMAL HIGH (ref 1.7–7.7)
Neutrophils Relative %: 86 %
Platelets: 160 10*3/uL (ref 150–400)
RBC: 4.19 MIL/uL — ABNORMAL LOW (ref 4.22–5.81)
RDW: 20.2 % — ABNORMAL HIGH (ref 11.5–15.5)
WBC: 12.3 10*3/uL — ABNORMAL HIGH (ref 4.0–10.5)
nRBC: 0 % (ref 0.0–0.2)

## 2019-05-27 LAB — COMPREHENSIVE METABOLIC PANEL
ALT: 45 U/L — ABNORMAL HIGH (ref 0–44)
AST: 18 U/L (ref 15–41)
Albumin: 3.1 g/dL — ABNORMAL LOW (ref 3.5–5.0)
Alkaline Phosphatase: 79 U/L (ref 38–126)
Anion gap: 13 (ref 5–15)
BUN: 12 mg/dL (ref 6–20)
CO2: 25 mmol/L (ref 22–32)
Calcium: 9.2 mg/dL (ref 8.9–10.3)
Chloride: 91 mmol/L — ABNORMAL LOW (ref 98–111)
Creatinine, Ser: 0.6 mg/dL — ABNORMAL LOW (ref 0.61–1.24)
GFR calc Af Amer: >60 mL/min (ref >60)
GFR calc non Af Amer: >60 mL/min (ref >60)
Glucose, Bld: 101 mg/dL — ABNORMAL HIGH (ref 70–99)
Potassium: 4.2 mmol/L (ref 3.5–5.1)
Sodium: 129 mmol/L — ABNORMAL LOW (ref 135–145)
Total Bilirubin: 0.9 mg/dL (ref 0.3–1.2)
Total Protein: 7.4 g/dL (ref 6.5–8.1)

## 2019-05-27 LAB — URINE CULTURE: Culture: NO GROWTH

## 2019-05-27 LAB — TSH: TSH: 0.617 u[IU]/mL (ref 0.350–4.500)

## 2019-05-27 LAB — URINALYSIS, ROUTINE W REFLEX MICROSCOPIC
Bilirubin Urine: NEGATIVE
Glucose, UA: NEGATIVE mg/dL
Ketones, ur: NEGATIVE mg/dL
Leukocytes,Ua: NEGATIVE
Nitrite: NEGATIVE
Protein, ur: NEGATIVE mg/dL
Specific Gravity, Urine: 1.011 (ref 1.005–1.030)
pH: 8 (ref 5.0–8.0)

## 2019-05-27 LAB — AMMONIA: Ammonia: 34 umol/L (ref 9–35)

## 2019-05-27 LAB — LACTIC ACID, PLASMA
Lactic Acid, Venous: 1 mmol/L (ref 0.5–1.9)
Lactic Acid, Venous: 0.8 mmol/L (ref 0.5–1.9)

## 2019-05-27 LAB — LIPASE, BLOOD: Lipase: 16 U/L (ref 11–51)

## 2019-05-27 MED ORDER — OXYCODONE HCL 5 MG PO TABS
5.0000 mg | ORAL_TABLET | Freq: Four times a day (QID) | ORAL | Status: DC | PRN
  Administered 2019-05-27 – 2019-05-28 (×3): 10 mg via ORAL
  Filled 2019-05-27 (×3): qty 2

## 2019-05-27 MED ORDER — SODIUM CHLORIDE 0.9 % IV BOLUS
1000.0000 mL | Freq: Once | INTRAVENOUS | Status: AC
  Administered 2019-05-27: 1000 mL via INTRAVENOUS

## 2019-05-27 MED ORDER — GADOBUTROL 1 MMOL/ML IV SOLN
7.5000 mL | Freq: Once | INTRAVENOUS | Status: AC | PRN
  Administered 2019-05-27: 7.5 mL via INTRAVENOUS

## 2019-05-27 MED ORDER — SENNOSIDES-DOCUSATE SODIUM 8.6-50 MG PO TABS
1.0000 | ORAL_TABLET | Freq: Two times a day (BID) | ORAL | Status: DC
  Administered 2019-05-27 – 2019-05-29 (×4): 1 via ORAL
  Filled 2019-05-27 (×4): qty 1

## 2019-05-27 MED ORDER — ONDANSETRON HCL 4 MG/2ML IJ SOLN: 4.0000 mg | Freq: Four times a day (QID) | INTRAMUSCULAR | Status: DC | PRN

## 2019-05-27 MED ORDER — ENOXAPARIN SODIUM 40 MG/0.4ML ~~LOC~~ SOLN
40.0000 mg | SUBCUTANEOUS | Status: DC
  Administered 2019-05-27: 40 mg via SUBCUTANEOUS
  Filled 2019-05-27: qty 0.4

## 2019-05-27 MED ORDER — DEXAMETHASONE SODIUM PHOSPHATE 4 MG/ML IJ SOLN
4.0000 mg | Freq: Four times a day (QID) | INTRAMUSCULAR | Status: DC
  Administered 2019-05-27 – 2019-05-29 (×7): 4 mg via INTRAVENOUS
  Filled 2019-05-27 (×7): qty 1

## 2019-05-27 MED ORDER — HYDROMORPHONE HCL 1 MG/ML IJ SOLN
1.0000 mg | Freq: Once | INTRAMUSCULAR | Status: AC
  Administered 2019-05-27: 1 mg via INTRAVENOUS
  Filled 2019-05-27: qty 1

## 2019-05-27 MED ORDER — PANTOPRAZOLE SODIUM 40 MG IV SOLR
40.0000 mg | INTRAVENOUS | Status: DC
  Administered 2019-05-27 – 2019-05-28 (×2): 40 mg via INTRAVENOUS
  Filled 2019-05-27 (×2): qty 40

## 2019-05-27 MED ORDER — HYDROMORPHONE HCL 1 MG/ML IJ SOLN
1.0000 mg | INTRAMUSCULAR | Status: DC | PRN
  Administered 2019-05-28: 2 mg via INTRAVENOUS
  Filled 2019-05-27: qty 2

## 2019-05-27 MED ORDER — DEXAMETHASONE SODIUM PHOSPHATE 4 MG/ML IJ SOLN: 4.0000 mg | Freq: Once | INTRAMUSCULAR | Status: DC

## 2019-05-27 MED ORDER — DICYCLOMINE HCL 10 MG PO CAPS: 10.0000 mg | ORAL_CAPSULE | ORAL | Status: DC | PRN

## 2019-05-27 NOTE — Progress Notes (Signed)
ED TO INPATIENT HANDOFF REPORT  Name/Age/Gender Micheal Vincent 59 y.o. male  Code Status    Code Status Orders  (From admission, onward)         Start     Ordered   05/27/19 1941  Full code  Continuous     05/27/19 1940        Code Status History    Date Active Date Inactive Code Status Order ID Comments User Context   04/14/2019 0054 04/16/2019 2055 Full Code [id number]  Truddie Hidden, MD ED   01/12/2019 1949 01/21/2019 2002 Full Code [id number]  Bennie Pierini, MD ED   11/10/2018 1755 11/13/2018 1852 Full Code [id number]  Kathi Ludwig, MD ED   Advance Care Planning Activity      Home/SNF/Other Home  Chief Complaint Brain metastases [facility]) [C79.31]  Level of Care/Admitting Diagnosis ED Disposition    ED Disposition Condition [facility]: [facility] [[id number]]  Level of Care: Med-Surg [16]  May admit patient to [facility] or [facility] if equivalent level of care is available:: Yes  Covid Evaluation: Asymptomatic Screening Protocol (No Symptoms)  Diagnosis: Brain metastases [facility]) [[id number]]  Admitting Physician: Chauncey Mann [[id number]]  Attending Physician: Chauncey Mann [phone]  Estimated length of stay: past midnight tomorrow  Certification:: I certify this patient will need inpatient services for at least 2 midnights       Medical History Past Medical History:  Diagnosis Date  . Lung cancer ([place])    lung ca with brain mets  . Septic arthritis ([place])     Allergies No Known Allergies  IV Location/Drains/Wounds Patient Lines/Drains/Airways Status   Active Line/Drains/Airways    Name:   Placement date:   Placement time:   Site:   Days:   Implanted Port 04/13/19 Right Chest   04/13/19    1843    Chest   44   Peripheral IV 05/27/19 Right Forearm   05/27/19    1700    Forearm   less than 1          Labs/Imaging Results for orders placed or performed during the hospital encounter of 05/27/19 (from the  past 48 hour(s))  CBC with Differential     Status: Abnormal   Collection Time: 05/27/19  4:11 PM  Result Value Ref Range   WBC 12.3 (H) 4.0 - 10.5 K/uL   RBC 4.19 (L) 4.22 - 5.81 MIL/uL   Hemoglobin 12.5 (L) 13.0 - 17.0 g/dL   HCT 38.7 (L) 39.0 - 52.0 %   MCV 92.4 80.0 - 100.0 fL   MCH 29.8 26.0 - 34.0 pg   MCHC 32.3 30.0 - 36.0 g/dL   RDW 20.2 (H) 11.5 - 15.5 %   Platelets 160 150 - 400 K/uL   nRBC 0.0 0.0 - 0.2 %   Neutrophils Relative % 86 %   Neutro Abs 10.5 (H) 1.7 - 7.7 K/uL   Lymphocytes Relative 3 %   Lymphs Abs 0.4 (L) 0.7 - 4.0 K/uL   Monocytes Relative 6 %   Monocytes Absolute 0.8 0.1 - 1.0 K/uL   Eosinophils Relative 0 %   Eosinophils Absolute 0.0 0.0 - 0.5 K/uL   Basophils Relative 0 %   Basophils Absolute 0.1 0.0 - 0.1 K/uL   Immature Granulocytes 5 %   Abs Immature Granulocytes 0.59 (H) 0.00 - 0.07 K/uL    Comment: Performed at [facility], [place] [address] [place], [place]  [address]  Comprehensive metabolic panel     Status: Abnormal   Collection Time: 05/27/19  4:11 PM  Result Value Ref Range   Sodium 129 (L) 135 - 145 mmol/L   Potassium 4.2 3.5 - 5.1 mmol/L   Chloride 91 (L) 98 - 111 mmol/L   CO2 25 22 - 32 mmol/L   Glucose, Bld 101 (H) 70 - 99 mg/dL    Comment: Glucose reference range applies only to samples taken after fasting for at least 8 hours.   BUN 12 6 - 20 mg/dL   Creatinine, Ser 0.60 (L) 0.61 - 1.24 mg/dL   Calcium 9.2 8.9 - 10.3 mg/dL   Total Protein 7.4 6.5 - 8.1 g/dL   Albumin 3.1 (L) 3.5 - 5.0 g/dL   AST 18 15 - 41 U/L   ALT 45 (H) 0 - 44 U/L   Alkaline Phosphatase 79 38 - 126 U/L   Total Bilirubin 0.9 0.3 - 1.2 mg/dL   GFR calc non Af Amer >60 >60 mL/min   GFR calc Af Amer >60 >60 mL/min   Anion gap 13 5 - 15    Comment: Performed at [facility], [place] [address] [place], [place] [address]  Ammonia     Status: None   Collection Time: 05/27/19  4:11 PM  Result Value Ref Range   Ammonia 34 9 -  35 umol/L    Comment: Performed at [facility], [place] [address] [place], [place] [address]  Lactic acid, plasma     Status: None   Collection Time: 05/27/19  4:11 PM  Result Value Ref Range   Lactic Acid, Venous 1.0 0.5 - 1.9 mmol/L    Comment: Performed at [facility], [place] [address], [place], [place] [address]  Lipase, blood     Status: None   Collection Time: 05/27/19  4:11 PM  Result Value Ref Range   Lipase 16 11 - 51 U/L    Comment: Performed at [facility], [place] [address] [place], [place] [address]  TSH     Status: None   Collection Time: 05/27/19  4:11 PM  Result Value Ref Range   TSH 0.617 0.350 - 4.500 uIU/mL    Comment: Performed by a 3rd Generation assay with a functional sensitivity of <=0.01 uIU/mL. Performed at [facility], [place] [address], [place], [place] [address]   Urinalysis, Routine w reflex microscopic     Status: Abnormal   Collection Time: 05/27/19  4:11 PM  Result Value Ref Range   Color, Urine YELLOW YELLOW   APPearance CLEAR CLEAR   Specific Gravity, Urine 1.011 1.005 - 1.030   pH 8.0 5.0 - 8.0   Glucose, UA NEGATIVE NEGATIVE mg/dL   Hgb urine dipstick SMALL (A) NEGATIVE   Bilirubin Urine NEGATIVE NEGATIVE   Ketones, ur NEGATIVE NEGATIVE mg/dL   Protein, ur NEGATIVE NEGATIVE mg/dL   Nitrite NEGATIVE NEGATIVE   Leukocytes,Ua NEGATIVE NEGATIVE   RBC / HPF 0-5 0 - 5 RBC/hpf   Bacteria, UA RARE (A) NONE SEEN    Comment: Performed at [facility], [place] [address] [place], [place] [address]  Lactic acid, plasma     Status: None   Collection Time: 05/27/19  6:01 PM  Result Value Ref Range   Lactic Acid, Venous 0.8 0.5 - 1.9 mmol/L    Comment: Performed at [facility], [place] [address] [place], [place] [address]   MR Brain  W and Wo Contrast  Result Date: 05/27/2019 CLINICAL DATA:  59 year old male with metastatic non-small cell carcinoma  of the left upper lung. Status post radiation treatment for palliation of brain metastases last month. Worsening altered mental status. EXAM: MRI HEAD WITHOUT AND WITH CONTRAST TECHNIQUE: Multiplanar, multiecho pulse sequences of the brain and surrounding structures were obtained without and with intravenous contrast. CONTRAST:  7.38mL GADAVIST GADOBUTROL 1 MMOL/ML IV SOLN COMPARISON:  Brain MRI 04/22/2019 and earlier. FINDINGS: BRAIN There is substantial new leptomeningeal enhancement involving the superior cerebellum, quadrigeminal plate, dorsal splenium (series 18, image 12). And the dominant superior cerebellar metastasis as seen on series 16, image 41 has increased from a roughly 18 mm to 27 mm. Moderately increased cerebellar edema is noted on series 12, image 13. There is increased mass effect on the 4th ventricle, although no associated ventriculomegaly or transependymal edema. Basilar cisterns remain patent at this time. Additionally, there are a number of other new or increased cerebral metastases, including: New Lesions: 8 mm nodular, enhancing lesion located along the dorsal midbrain near the cerebral aqueduct with mild surrounding edema and no mass effect seen on series 16, image 54. 9 mm enhancing and partially dural based lesion located posterior right frontal operculum with mild surrounding edema and minimal mass effect seen on series 16 image 87 and series 17, image 15. 3 mm enhancing lesion located at the superior and medial right thalamus with minimal edema and no mass effect seen on series 16, image 78. 10-12 mm enhancing lesion located in the left choroid plexus seen on series 16, image 84. There is associated new edema in the medial left thalamus, and splenium of the corpus callosum (series 12, image 27). 9 mm enhancing lesion located at the right cerebral peduncle with mild to moderate edema and no mass effect seen on series 16, image 59. 9 mm enhancing lesion located at the right cerebral  peduncle with mild to moderate edema and no mass effect seen on series 16, image 59. Larger lesions: Increased small enhancing lesion located in the left parietal lobe, increased from punctate (series 11, image 90 previously) to now 7 mm, with mild new edema (series 12, image 28), and no no mass effect. Series 16, image 81. Increased small enhancing lesion located at the medial anterior left frontal lobe, increased from punctate to now 5 mm, minimal edema, no mass effect. Series 16, image 67. Increased small enhancing lesion located at the dorsal right thalamus, increased from punctate to now 6 mm, minimal edema, no mass effect. Also on image 67. Increased small enhancing lesion located at the anterior right temporal tip, increased from punctate to now 6 mm, mild no mass effect. Series 16, image 40. Treated enhancing lesion located at the anterior right frontal lobe has increased from 15 mm to now 17 mm, but with stable confluent regional T2/FLAIR hyperintensity in a vasogenic edema pattern, no increased mass effect. Series 16, image 93. Treated small enhancing lesion located anterior left frontal lobe has increased from 4 mm to now 8 mm, with mildly increased edema, no increased mass effect. Series 16, image 107. Other Brain findings: Cerebral edema has also moderately increased in the posterior right temporal lobe, mesial left temporal lobe. There is no midline shift. Some of the cerebellar lesions are hemorrhagic as before, but no acute intracranial hemorrhage is identified. There is hypercellularity associated abnormal diffusion. But no restricted diffusion suspicious for acute infarction. Negative pituitary and cervicomedullary junction. Vascular: Major intracranial vascular flow voids are  stable. The major dural venous sinuses are enhancing and appear to be patent. Skull and upper cervical spine: Questionable new punctate abnormal enhancement of the right side cervical spinal cord at C2-C3 on series 18 image  13. No cord edema. Visible bone marrow signal remains within normal limits. Sinuses/Orbits: Stable, negative. Other: Mastoids remain clear. But there is new leptomeningeal enhancement in the bilateral internal auditory canals (series 16, image 30 on the right). IMPRESSION: 1. Substantial progression metastatic disease to the brain since 04/22/2019, most notably new abundant leptomeningeal tumor affecting the cerebellum, the midbrain, and splenium. 2. Additionally, there are at least 12 new or larger discrete brain metastases when compared to 04/22/2019. There is also a questionable new tiny right C2-C3 spinal cord metastasis. 3. Associated increased and moderate edema in the cerebellum, right temporal lobe, left thalamus and splenium. 4. However, there is no ventriculomegaly, midline shift, or loss of basilar cisterns at this time. Electronically Signed   By: Genevie Ann M.D.   On: 05/27/2019 18:13   DG Chest Portable 1 View  Result Date: 05/27/2019 CLINICAL DATA:  Lung cancer.  Fatigue and cough. EXAM: PORTABLE CHEST 1 VIEW COMPARISON:  05/24/2019 FINDINGS: 1539 hours. The cardiopericardial silhouette is within normal limits for size. Right lung clear. Cavitary lesion left apex is similar to prior. Right Port-A-Cath unchanged. The visualized bony structures of the thorax are intact. Telemetry leads overlie the chest. IMPRESSION: Stable.  No new or acute interval findings. Electronically Signed   By: Misty Stanley M.D.   On: 05/27/2019 16:00    Pending Labs Unresulted Labs (From admission, onward)    Start     Ordered   Jun 14, 2019 0500  Creatinine, serum  (enoxaparin (LOVENOX)    CrCl >/= 30 ml/min)  Weekly,   R    Comments: while on enoxaparin therapy    05/27/19 1940   05/28/19 0500  Comprehensive metabolic panel  Tomorrow morning,   R     05/27/19 1940   05/28/19 0500  CBC  Tomorrow morning,   R     05/27/19 1940   05/27/19 1838  SARS CORONAVIRUS 2 (TAT 6-24 HRS) Nasopharyngeal Nasopharyngeal Swab   (Tier 3 (TAT 6-24 hrs))  Once,   STAT    Question Answer Comment  Is this test for diagnosis or screening Screening   Symptomatic for COVID-19 as defined by CDC No   Hospitalized for COVID-19 No   Admitted to ICU for COVID-19 No   Previously tested for COVID-19 Yes   Resident in a congregate (group) care setting No   Employed in healthcare setting No      05/27/19 1837   05/27/19 1528  Urine culture  ONCE - STAT,   STAT     05/27/19 1528   05/27/19 1528  Blood culture (routine x 2)  BLOOD CULTURE X 2,   STAT     05/27/19 1528          Vitals/Pain Today's Vitals   05/27/19 1930 05/27/19 1945 05/27/19 2000 05/27/19 2001  BP: (!) 158/97     Pulse: 81 83 85   Resp: 20 13 14    Temp:      TempSrc:      SpO2: 91% 95% 91% 96%  Weight:      Height:      PainSc:        Isolation Precautions No active isolations  Medications Medications  dexamethasone (DECADRON) injection 4 mg (4 mg Intravenous Given 05/27/19 1917)  oxyCODONE (Oxy  IR/ROXICODONE) immediate release tablet 5-10 mg (has no administration in time range)  HYDROmorphone (DILAUDID) injection 1-2 mg (has no administration in time range)  dicyclomine (BENTYL) capsule 10 mg (has no administration in time range)  pantoprazole (PROTONIX) injection 40 mg (has no administration in time range)  ondansetron (ZOFRAN) injection 4 mg (has no administration in time range)  enoxaparin (LOVENOX) injection 40 mg (has no administration in time range)  sodium chloride 0.9 % bolus 1,000 mL (1,000 mLs Intravenous New Bag/Given 05/27/19 1800)  gadobutrol (GADAVIST) 1 MMOL/ML injection 7.5 mL (7.5 mLs Intravenous Contrast Given 05/27/19 1719)  HYDROmorphone (DILAUDID) injection 1 mg (1 mg Intravenous Given 05/27/19 1917)    Mobility walks with device

## 2019-05-27 NOTE — Telephone Encounter (Signed)
Spoke with Pam at 10:50am regarding recent clinical changes.   Discussed the possibility of recent drop in corticosteroid dose as potentially responsible for symptoms. We had decreased from 12mg  daily dexamethasone to 4mg  daily on 4/6 because of concurrent immunotherapy (monotherapy) and overall poor response to steroids.   Recommended immediately increasing to 4mg  BID or 8mg  daily, with plan to decrease to 4mg  daily in 1 week as discussed.  If no improvement seen over next 1-2 days she will bring him to the ED for systemic workup and CNS imaging.  Ventura Sellers, MD

## 2019-05-27 NOTE — Progress Notes (Signed)
Micheal Vincent is seen today after being seen earlier this week for abdominal pain.  He presents today with report that he has been having hematuria.  He noticed most of this yesterday and again this morning.  He is being seen without his sister present today.  Due to his dysarthria it is difficult to have a clear history of present illness today.  He denies fevers, chills, or sweats.  He denies dysuria or difficulty urinating.  A urinalysis returned showing moderate hemoglobin on the dipstick with greater than 50 red blood cells per high-powered field and crystals present.  A CBC returned stable with a hemoglobin of 12.3.  A urine culture is pending.  Sandi Mealy, MHS, PA-C Physician Assistant

## 2019-05-27 NOTE — ED Provider Notes (Signed)
Care transferred to me.  MRI shows multiple new metastatic lesions and worsening of overall brain metastases.  Patient is awake and alert though has a headache.  Discussed with Dr. Mickeal Skinner, who recommends that the patient be transferred to a palliative/hospice.  Probably best to do this in the hospital and recommends admission and he will see in the morning.  I discussed with Pam, his sister and she understands that there is no real curative treatment available anymore.  Understands need to transition to palliative/hospice.  She would like to personally break this to the patient though he does know that he has worsening metastases.  Will admit to hospitalist service. Decadron 4 mg q6 per Dr. Mickeal Skinner.   Sherwood Gambler, MD 05/27/19 1850

## 2019-05-27 NOTE — ED Triage Notes (Signed)
Patient presents with confusion, decreased mobility, decreased appetite, dark urine with possible blood and diarrhea. He has had to decreased fluid intake for medical reasons. The patient has had slurring of speech since sat but stroke has since been ruled out. There had also been and increase in pain medication. Patient is A&O x 3. Hx. Lung cancer and mets to the brain, kidney and stomach.    EMS vitals: 156/94 BP  90 HR 22 RR 99% O2 sat on room air 99 CBG

## 2019-05-27 NOTE — Telephone Encounter (Signed)
Very familiar with patient and his sister, Pam. Received voicemail message from [facility] this morning requesting a return call urgently. Phoned Pam back promptly. She states, "Brooke Bonito is changing quickly right before my eyes and he is in so much pain _I don't know what to do." Pam explains that he typically complains of pain in his stomach but since yesterday he complains of great pain in his head. She reports he is holding his head and walking slumped like "an old man." She reports he is in a "demented state" as well. For example, she reports placing his pills in his mouth after he couldn't then he couldn't remember how to swallow them. She reports she found him in the bath tub at 1 am. She said he told her he was trying to get some pain relief. She goes onto explain he couldn't remember how to get out of the tub. She reports she wasn't able to get him settle again until 3 am. Also, she reports he is limping from weakness in his left leg. She denies that he has had fevers, nausea or vomiting.   Pam reports giving her brother MS Contin every 12 hours as directed with next dose due at 10 am. She reports four hours after 10 she will give him 2 tablets of oxycodone. She confirms he is taking decadron 2 mg in the morning and 2 mg in the evening.   Pam verbalizes fear for her brother and great concern about his pain level. She is agreeable to in home assistance such as palliative care if recommended by the providers.   Pam understands this RN will inform all providers and phone her back with next steps.
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
## 2019-05-27 NOTE — Telephone Encounter (Signed)
[facility] and [facility] both declined services in the home for patient.
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
## 2019-05-28 ENCOUNTER — Telehealth: Payer: Self-pay | Admitting: *Deleted

## 2019-05-28 ENCOUNTER — Other Ambulatory Visit: Payer: Self-pay | Admitting: Oncology

## 2019-05-28 ENCOUNTER — Other Ambulatory Visit: Payer: Self-pay | Admitting: Radiation Therapy

## 2019-05-28 DIAGNOSIS — C7931 Secondary malignant neoplasm of brain: Principal | ICD-10-CM

## 2019-05-28 LAB — COMPREHENSIVE METABOLIC PANEL
ALT: 41 U/L (ref 0–44)
AST: 17 U/L (ref 15–41)
Albumin: 2.9 g/dL — ABNORMAL LOW (ref 3.5–5.0)
Alkaline Phosphatase: 69 U/L (ref 38–126)
Anion gap: 13 (ref 5–15)
BUN: 12 mg/dL (ref 6–20)
CO2: 27 mmol/L (ref 22–32)
Calcium: 9 mg/dL (ref 8.9–10.3)
Chloride: 93 mmol/L — ABNORMAL LOW (ref 98–111)
Creatinine, Ser: 0.57 mg/dL — ABNORMAL LOW (ref 0.61–1.24)
GFR calc Af Amer: >60 mL/min (ref >60)
GFR calc non Af Amer: >60 mL/min (ref >60)
Glucose, Bld: 113 mg/dL — ABNORMAL HIGH (ref 70–99)
Potassium: 4.2 mmol/L (ref 3.5–5.1)
Sodium: 133 mmol/L — ABNORMAL LOW (ref 135–145)
Total Bilirubin: 0.7 mg/dL (ref 0.3–1.2)
Total Protein: 6.8 g/dL (ref 6.5–8.1)

## 2019-05-28 LAB — CBC
HCT: 36.1 % — ABNORMAL LOW (ref 39.0–52.0)
Hemoglobin: 11.9 g/dL — ABNORMAL LOW (ref 13.0–17.0)
MCH: 30.8 pg (ref 26.0–34.0)
MCHC: 33 g/dL (ref 30.0–36.0)
MCV: 93.5 fL (ref 80.0–100.0)
Platelets: 161 10*3/uL (ref 150–400)
RBC: 3.86 MIL/uL — ABNORMAL LOW (ref 4.22–5.81)
RDW: 20.3 % — ABNORMAL HIGH (ref 11.5–15.5)
WBC: 15.1 10*3/uL — ABNORMAL HIGH (ref 4.0–10.5)
nRBC: 0 % (ref 0.0–0.2)

## 2019-05-28 LAB — SARS CORONAVIRUS 2 (TAT 6-24 HRS): SARS Coronavirus 2: NEGATIVE

## 2019-05-28 LAB — OSMOLALITY, URINE: Osmolality, Ur: 479 mOsm/kg (ref 300–900)

## 2019-05-28 LAB — SODIUM, URINE, RANDOM: Sodium, Ur: 135 mmol/L

## 2019-05-28 MED ORDER — CLONAZEPAM 0.125 MG PO TBDP: 1.0000 mg | ORAL_TABLET | Freq: Two times a day (BID) | ORAL | Status: DC

## 2019-05-28 MED ORDER — POLYETHYLENE GLYCOL 3350 17 G PO PACK
17.0000 g | PACK | Freq: Every day | ORAL | Status: DC
  Administered 2019-05-28 – 2019-05-29 (×2): 17 g via ORAL
  Filled 2019-05-28 (×2): qty 1

## 2019-05-28 MED ORDER — CLONAZEPAM 1 MG PO TABS
1.0000 mg | ORAL_TABLET | Freq: Two times a day (BID) | ORAL | Status: DC
  Administered 2019-05-29 (×2): 1 mg via ORAL
  Filled 2019-05-28 (×2): qty 1

## 2019-05-28 MED ORDER — HYDROMORPHONE HCL 1 MG/ML IJ SOLN
1.0000 mg | INTRAMUSCULAR | Status: DC | PRN
  Administered 2019-05-28 – 2019-05-29 (×4): 2 mg via INTRAVENOUS
  Administered 2019-05-29: 1 mg via INTRAVENOUS
  Filled 2019-05-28 (×3): qty 2
  Filled 2019-05-28: qty 1
  Filled 2019-05-28: qty 2

## 2019-05-28 MED ORDER — FENTANYL 25 MCG/HR TD PT72
1.0000 | MEDICATED_PATCH | TRANSDERMAL | Status: DC
  Administered 2019-05-28: 1 via TRANSDERMAL
  Filled 2019-05-28: qty 1

## 2019-05-28 NOTE — TOC Initial Note (Signed)
Transition of Care [facility]) - Initial/Assessment Note    Patient Details  Name: Micheal Vincent MRN: [id number] Date of Birth: 1960-05-02  Transition of Care [facility]) CM/SW Contact:    Lynnell Catalan, RN Phone Number: 05/28/2019, 1:59 PM  Clinical Narrative:                 [facility] consult for home with [facility] [place]. Pt from home with sister [name]. HOP liaison contacted for referral. TOC will continue to follow.  Expected Discharge Plan: Home w Hospice Care Barriers to Discharge: Continued Medical Work up   Expected Discharge Plan and Services Expected Discharge Plan: [place]   Discharge Planning Services: CM Consult Post Acute Care Choice: Hospice                             HH Arranged: Disease Management [place] Agency: [place] Date [place]: 05/28/19 Time [place]: 12 Representative spoke with at [place]: [place] Arrangements/Services   Lives with:: Siblings          Need for Family Participation in Patient Care: Yes (Comment) Care giver support system in place?: Yes (comment)      Activities of Daily Living Home Assistive Devices/Equipment: None ADL Screening (condition at time of admission) Patient's cognitive ability adequate to safely complete daily activities?: No Is the patient deaf or have difficulty hearing?: No Does the patient have difficulty seeing, even when wearing glasses/contacts?: No Does the patient have difficulty concentrating, remembering, or making decisions?: Yes Patient able to express need for assistance with ADLs?: Yes Does the patient have difficulty dressing or bathing?: No Independently performs ADLs?: No Communication: Independent Dressing (OT): Independent Grooming: Independent Feeding: Independent Bathing: Independent Toileting: Needs assistance Is this a change from baseline?: Pre-admission baseline In/Out Bed: Needs assistance Is this a change from baseline?:  Pre-admission baseline Walks in Home: Needs assistance Is this a change from baseline?: Pre-admission baseline Does the patient have difficulty walking or climbing stairs?: Yes Weakness of Legs: Both Weakness of Arms/Hands: Both  Permission Sought/Granted                  Emotional Assessment              Admission diagnosis:  Somnolence [R40.0] Brain metastases ([facility]) [C79.31] Fatigue, unspecified type [R53.83] Patient Active Problem List   Diagnosis Date Noted  . Brain metastases ([place]) 05/27/2019  . Hyponatremia 05/27/2019  . Goals of care, counseling/discussion 05/18/2019  . Anxiety 05/11/2019  . Cancer-related pain 05/11/2019  . Blood in stool 05/11/2019  . Palliative care patient 05/11/2019  . Protein-calorie malnutrition, severe 04/15/2019  . Fever 04/14/2019  . Fever of unknown origin 04/13/2019  . Port-A-Cath in place 03/15/2019  . Squamous cell carcinoma of bronchus in left upper lobe ([place]) 02/02/2019  . Metastatic non-small cell lung cancer ([place]) 01/29/2019  . 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 Lung mass   . Cerebellar mass 01/12/2019  . Tobacco use 11/19/2018  . Cocaine use 11/19/2018  . Peritonsillar abscess 11/10/2018  . Mass of upper lobe of left lung 11/10/2018   PCP:  Patient, No Pcp Per Pharmacy:   [id number] - [place], [place] [address] Phone: [phone] Fax: [place], [place] [address] [place] [address] Phone: [phone] Fax: [phone]  [place] ([place]), [place] - [address] [place]) [place] [address] Phone: [phone] Fax: [place], [place] [address] [place] [address] Phone:  [phone] Fax: [place], [place] - [address] [place] [address] Phone: [phone] Fax: [phone]  [id number] - [place], [place]. [address]. [place] [address] Phone: [phone] Fax: [phone]     Social Determinants of Health (SDOH) Interventions    Readmission Risk Interventions Readmission Risk Prevention Plan 05/28/2019  Transportation Screening Complete  Medication Review Press photographer) Complete  PCP or Specialist appointment within 3-5 days of discharge Complete  HRI or [place] Not Applicable  Some recent data might be hidden
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
## 2019-05-28 NOTE — Telephone Encounter (Signed)
Received vm call from Terry/[facility].  She stated that she was making sure someone had contacted [place] to inform that they couldn't staff the referral.  Noted pt is admitted.  Message routed to Dr. Mickeal Skinner & Dr Lorenso Courier.

## 2019-05-28 NOTE — Consult Note (Signed)
Palliative Care Inpatient Consultation Reason: Cancer Progression, Cancer Related Pain Requested by: Micheal Vincent  Patient Narrative: [name] is a 59 year old gentleman with a history significant for metastatic non-small cell lung cancer with metastasis to his brain diagnosed in September 2020. He is well-known to me from the palliative care radiation oncology clinic where I have been following him for symptom management. Unfortunately he has had disease progression despite chemoradiation. He is now having severe pain from spinal cord progression and is unable to walk, his speech is also slurred and he is having worsening difficulty communicating. At this time his prognosis is poor and he is no longer a candidate for any additional cancer treatment-hospice care was recommended by oncology team. He was admitted last PM after having acute onset pain,severe weakness and confusion.  Social Hx: He currently lives with his Micheal Vincent who is his primary caregiver.  He has a total of 7 sisters.  He has had the recent tragic and unexpected deaths of 2 of his sons just prior to his diagnosis of lung cancer last year.  His primary insurance is Medicaid.  Goals of Care: I spoke with Micheal Vincent, his caregiver/sister Micheal Vincent and another sister at bedside to discuss his prognosis, goals of care and options for his care moving forward.  I introduced the concept of hospice care in the context of his current condition.  I also had a CODE STATUS discussion with him and he is now DO NOT RESUSCITATE.  He is agreeable to hospice care at home (he lives with his sister).  He does not want to be in a facility and wants to make sure that his hospitalization is a short.  He wants his pain controlled and to be comfortable for the time he has left.  Micheal Vincent recognizes that he is approaching end-of-life and he tells me that he is at spiritual peace.   Exam: He is diaphoretic, more withdrawn, speech is mildly slurred, eyes are red, breathing  is unlabored.  Recommendations:  1. DNR, Comfort Measures Only 2. Referral for hospice care at home 3. Hydromorphone 1-2 IV q11min PRN breakthrough pain 4. He is having difficulty with pills placed a 60mcg Duragesic patch 5. Seizure prophylaxis/Anxiety control - Klonopin 1mg  BID  Will need DME set up at home-if in place can d/c home tomorrow with hospice.  Micheal Hacker, DO Palliative Medicine  Time: 70 minutes Greater than 50%  of this time was spent counseling and coordinating care related to the above assessment and plan.

## 2019-05-28 NOTE — Progress Notes (Addendum)
PROGRESS NOTE    Micheal Vincent  YBW:[id number] DOB: [birth year] DOA: 05/27/2019 PCP: Patient, No Pcp Per   Brief Narrative:  Patient is a 59 year old male with history of non-small cell lung cancer with mets to brain who presented to the emergency department with confusion, fatigue, headache.  Patient was noted to have worsening confusion and declining over last few days.  Also reported of nonfluent speech, dysarthria.  MRI of the brain done in the emergency department showed substantial progression of metastatic disease to the brain since 04/22/2019, new abundant leptomeningeal tumor affecting cerebellum, midbrain, splenium.Found to have   at least 12 new or larger discrete brain metastasis since last imaging.  Neuro oncology, Dr. Mickeal Skinner was consulted who recommended palliative care consult/comfort care.  Started on Decadron. After discussion about goals of care, patient has wished to transition his care to comfort.  Plan is to discharge to home with hospice tomorrow.  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  MRI as above.  He was following with neuro oncology.  Currently on Decadron.  Neuro oncology, Dr Mickeal Skinner  has been consulted who recommended palliative care consult, comfort care . Patient understands the current situation and wishes to can transition his care to comfort.  Palliative care following.  Plan is to discharge him to home with hospice. He was previously on chemotherapy, radiation therapy for his lung  cancer.  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  He was on Fluid restriction.  Abdominal pain: Continue bowel regimen.  Low suspicion for acute intra-abdominal pathology.  Abdominal x-ray on 05/31/2019 showed large amount of stool.        DVT prophylaxis:SCD Code Status: Full Family Communication: None present at the bedside.  Neuro-oncology discussed with the family. Disposition Plan: Patient is from home. Plan is to discharge to home with hospice tomorrow.TOC aware  Consultants: Oncology, palliative care  Procedures: None  Antimicrobials:  Anti-infectives (From admission, onward)   None      Subjective: Patient seen and examined at the bedside this morning.  Hemodynamically stable.  Was talking on the phone.  Denies any headache.  Noted to have right facial droop.  Objective: Vitals:   05/27/19 2001 05/27/19 2127 05/28/19 0208 05/28/19 0615  BP:  (!) 144/120 130/88 (!) 136/95  Pulse:  88 92 82  Resp:  17 16 16   Temp:  97.8 F (36.6 C) 98.7 F (37.1 C) 98.5 F (36.9 C)  TempSrc:  Oral Oral Oral  SpO2: 96% 95% 95% 96%  Weight:      Height:        Intake/Output Summary (Last 24 hours) at 05/28/2019 0837 Last data filed at 05/28/2019 0207 Gross per 24 hour  Intake --  Output 775 ml  Net -775 ml   Filed Weights   05/27/19 1418  Weight: 78.5 kg    Examination:  General exam: Chronically ill looking, weak HEENT:PERRL,Oral mucosa moist, Ear/Nose normal on gross exam, right facial droop Respiratory system: Bilateral equal air entry, normal  vesicular breath sounds, no wheezes or crackles  Cardiovascular system: S1 & S2 heard, RRR. No JVD, murmurs, rubs, gallops or clicks. No pedal edema.  Port-A-Cath in the right chest Gastrointestinal system: Abdomen is nondistended, soft and nontender. No organomegaly or masses felt. Normal bowel sounds heard. Central nervous system: Alert and oriented. No focal neurological deficits. Extremities: No edema,  no clubbing ,no cyanosis Skin: No rashes, lesions or ulcers,no icterus ,no pallor   Data Reviewed: I have personally reviewed following labs and imaging studies  CBC: Recent Labs  Lab 05/22/19 1148 05/24/19 1054 05/26/19 1430 05/27/19 1611 05/28/19 0500  WBC 7.4 10.6* 10.3 12.3* 15.1*  NEUTROABS 6.3 9.3* 8.9* 10.5*  --   HGB 12.0* 12.5* 12.3* 12.5* 11.9*  HCT 37.3* 37.9* 38.0* 38.7* 36.1*  MCV 93.3 92.9 93.1 92.4 93.5  PLT 142* 162 145* 160 932   Basic Metabolic Panel: Recent Labs  Lab 05/22/19 1148 05/24/19 1054 05/27/19 1611 05/28/19 0500  NA 128* 136 129* 133*  K 3.8 4.5 4.2 4.2  CL 91* 98 91* 93*  CO2 26 25 25 27   GLUCOSE 109* 123* 101* 113*  BUN 12 13 12 12   CREATININE 0.65 0.76 0.60* 0.57*  CALCIUM 8.8* 9.6 9.2 9.0  MG  --  1.9  --   --    GFR: Estimated Creatinine Clearance: 110.5 mL/min (A) (by C-G formula based on SCr of 0.57 mg/dL (L)). Liver Function Tests: Recent Labs  Lab 05/22/19 1148 05/24/19 1054 05/27/19 1611 05/28/19 0500  AST 17 14* 18 17  ALT 38 38 45* 41  ALKPHOS 71 82 79 69  BILITOT 0.8 0.4 0.9 0.7  PROT 7.0 7.3 7.4 6.8  ALBUMIN 3.2* 2.7* 3.1* 2.9*   Recent Labs  Lab 05/27/19 1611  LIPASE 16   Recent Labs  Lab 05/27/19 1611  AMMONIA 34   Coagulation Profile: No results for input(s): INR, PROTIME in the last 168 hours. Cardiac Enzymes: No results for input(s): CKTOTAL, CKMB, CKMBINDEX, TROPONINI in the last 168 hours. BNP (last 3 results) No results for input(s): PROBNP in the last 8760 hours. HbA1C: No results for input(s): HGBA1C in the last 72 hours. CBG: No results for input(s): GLUCAP in the last 168 hours. Lipid Profile: No results for input(s): CHOL, HDL, LDLCALC, TRIG, CHOLHDL, LDLDIRECT in the last 72 hours. Thyroid Function Tests: Recent Labs    05/27/19 1611  TSH 0.617   Anemia Panel: No results for input(s): VITAMINB12, FOLATE, FERRITIN, TIBC, IRON, RETICCTPCT in the last 72 hours. Sepsis  Labs: Recent Labs  Lab 05/27/19 1611 05/27/19 1801  LATICACIDVEN 1.0 0.8    Recent Results (from the past 240 hour(s))  Urine Culture     Status: None   Collection Time: 05/26/19  2:40 PM   Specimen: Urine, Clean Catch  Result Value Ref Range Status   Specimen Description   Final    URINE, CLEAN CATCH Performed at [facility], [address] [place], [place] [address]    Special Requests   Final    NONE Performed at [facility], [place] [address] [place], [place] [address]    Culture   Final    NO GROWTH Performed at [facility], [place] [address] [place], [place] [address]    Report Status 05/27/2019 FINAL  Final  SARS CORONAVIRUS 2 (TAT 6-24 HRS) Nasopharyngeal Nasopharyngeal Swab     Status: None   Collection Time: 05/27/19  7:17 PM   Specimen:  Nasopharyngeal Swab  Result Value Ref Range Status   SARS Coronavirus 2 NEGATIVE NEGATIVE Final    Comment: (NOTE) SARS-CoV-2 target nucleic acids are NOT DETECTED. The SARS-CoV-2 RNA is generally detectable in upper and lower respiratory specimens during the acute phase of infection. Negative results do not preclude SARS-CoV-2 infection, do not rule out co-infections with other pathogens, and should not be used as the sole basis for treatment or other patient management decisions. Negative results must be combined with clinical observations, patient history, and epidemiological information. The expected result is Negative. Fact Sheet for Patients: SugarRoll.be Fact Sheet for Healthcare Providers: https://www.woods-mathews.com/ This test is not yet approved or cleared by the [place] FDA and  has been authorized for detection and/or diagnosis of SARS-CoV-2 by FDA under an Emergency Use Authorization (EUA). This EUA will remain  in effect (meaning this test can be used) for the duration of the COVID-19 declaration under  Section 56 4(b)(1) of the Act, 21 U.S.C. section 360bbb-3(b)(1), unless the authorization is terminated or revoked sooner. Performed at [facility], [place] [address] [place],  [address]          Radiology Studies: MR Brain W and Wo Contrast  Result Date: 05/27/2019 CLINICAL DATA:  59 year old male with metastatic non-small cell carcinoma of the left upper lung. Status post radiation treatment for palliation of brain metastases last month. Worsening altered mental status. EXAM: MRI HEAD WITHOUT AND WITH CONTRAST TECHNIQUE: Multiplanar, multiecho pulse sequences of the brain and surrounding structures were obtained without and with intravenous contrast. CONTRAST:  7.49mL GADAVIST GADOBUTROL 1 MMOL/ML IV SOLN COMPARISON:  Brain MRI 04/22/2019 and earlier. FINDINGS: BRAIN There is substantial new leptomeningeal enhancement involving the superior cerebellum, quadrigeminal plate, dorsal splenium (series 18, image 12). And the dominant superior cerebellar metastasis as seen on series 16, image 41 has increased from a roughly 18 mm to 27 mm. Moderately increased cerebellar edema is noted on series 12, image 13. There is increased mass effect on the 4th ventricle, although no associated ventriculomegaly or transependymal edema. Basilar cisterns remain patent at this time. Additionally, there are a number of other new or increased cerebral metastases, including: New Lesions: 8 mm nodular, enhancing lesion located along the dorsal midbrain near the cerebral aqueduct with mild surrounding edema and no mass effect seen on series 16, image 54. 9 mm enhancing and partially dural based lesion located posterior right frontal operculum with mild surrounding edema and minimal mass effect seen on series 16 image 87 and series 17, image 15. 3 mm enhancing lesion located at the superior and medial right thalamus with minimal edema and no mass effect seen on series 16, image 78. 10-12 mm enhancing lesion located  in the left choroid plexus seen on series 16, image 84. There is associated new edema in the medial left thalamus, and splenium of the corpus callosum (series 12, image 27). 9 mm enhancing lesion located at the right cerebral peduncle with mild to moderate edema and no mass effect seen on series 16, image 59. 9 mm enhancing lesion located at the right cerebral peduncle with mild to moderate edema and no mass effect seen on series 16, image 59. Larger lesions: Increased small enhancing lesion located in the left parietal lobe, increased from punctate (series 11, image 90 previously) to now 7 mm, with mild new edema (series 12, image 28), and no no mass effect. Series 16, image 81. Increased small enhancing lesion located at the medial anterior left frontal lobe,  increased from punctate to now 5 mm, minimal edema, no mass effect. Series 16, image 67. Increased small enhancing lesion located at the dorsal right thalamus, increased from punctate to now 6 mm, minimal edema, no mass effect. Also on image 67. Increased small enhancing lesion located at the anterior right temporal tip, increased from punctate to now 6 mm, mild no mass effect. Series 16, image 40. Treated enhancing lesion located at the anterior right frontal lobe has increased from 15 mm to now 17 mm, but with stable confluent regional T2/FLAIR hyperintensity in a vasogenic edema pattern, no increased mass effect. Series 16, image 93. Treated small enhancing lesion located anterior left frontal lobe has increased from 4 mm to now 8 mm, with mildly increased edema, no increased mass effect. Series 16, image 107. Other Brain findings: Cerebral edema has also moderately increased in the posterior right temporal lobe, mesial left temporal lobe. There is no midline shift. Some of the cerebellar lesions are hemorrhagic as before, but no acute intracranial hemorrhage is identified. There is hypercellularity associated abnormal diffusion. But no restricted  diffusion suspicious for acute infarction. Negative pituitary and cervicomedullary junction. Vascular: Major intracranial vascular flow voids are stable. The major dural venous sinuses are enhancing and appear to be patent. Skull and upper cervical spine: Questionable new punctate abnormal enhancement of the right side cervical spinal cord at C2-C3 on series 18 image 13. No cord edema. Visible bone marrow signal remains within normal limits. Sinuses/Orbits: Stable, negative. Other: Mastoids remain clear. But there is new leptomeningeal enhancement in the bilateral internal auditory canals (series 16, image 30 on the right). IMPRESSION: 1. Substantial progression metastatic disease to the brain since 04/22/2019, most notably new abundant leptomeningeal tumor affecting the cerebellum, the midbrain, and splenium. 2. Additionally, there are at least 12 new or larger discrete brain metastases when compared to 04/22/2019. There is also a questionable new tiny right C2-C3 spinal cord metastasis. 3. Associated increased and moderate edema in the cerebellum, right temporal lobe, left thalamus and splenium. 4. However, there is no ventriculomegaly, midline shift, or loss of basilar cisterns at this time. Electronically Signed   By: Genevie Ann M.D.   On: 05/27/2019 18:13   DG Chest Portable 1 View  Result Date: 05/27/2019 CLINICAL DATA:  Lung cancer.  Fatigue and cough. EXAM: PORTABLE CHEST 1 VIEW COMPARISON:  05/24/2019 FINDINGS: 1539 hours. The cardiopericardial silhouette is within normal limits for size. Right lung clear. Cavitary lesion left apex is similar to prior. Right Port-A-Cath unchanged. The visualized bony structures of the thorax are intact. Telemetry leads overlie the chest. IMPRESSION: Stable.  No new or acute interval findings. Electronically Signed   By: Misty Stanley M.D.   On: 05/27/2019 16:00        Scheduled Meds: . dexamethasone (DECADRON) injection  4 mg Intravenous Q6H  . enoxaparin (LOVENOX)  injection  40 mg Subcutaneous Q24H  . pantoprazole (PROTONIX) IV  40 mg Intravenous Q24H  . senna-docusate  1 tablet Oral BID   Continuous Infusions:   LOS: 1 day    Time spent: 35 mins.More than 50% of that time was spent in counseling and/or coordination of care.      Shelly Coss, MD [facility] P4/10/2019, 8:37 AM

## 2019-05-29 LAB — URINE CULTURE: Culture: NO GROWTH

## 2019-05-29 MED ORDER — SENNOSIDES-DOCUSATE SODIUM 8.6-50 MG PO TABS: 1.0000 | ORAL_TABLET | Freq: Two times a day (BID) | ORAL | 1 refills | Status: AC

## 2019-05-29 MED ORDER — DEXAMETHASONE 4 MG PO TABS: 4.0000 mg | ORAL_TABLET | Freq: Two times a day (BID) | ORAL | 1 refills | Status: AC

## 2019-05-29 MED ORDER — CHLORHEXIDINE GLUCONATE CLOTH 2 % EX PADS
6.0000 | MEDICATED_PAD | Freq: Every day | CUTANEOUS | Status: DC
  Administered 2019-05-29: 6 via TOPICAL

## 2019-05-29 MED ORDER — DEXAMETHASONE 4 MG PO TABS: 4.0000 mg | ORAL_TABLET | Freq: Two times a day (BID) | ORAL | 1 refills | Status: DC

## 2019-05-29 MED ORDER — HEPARIN SOD (PORK) LOCK FLUSH 100 UNIT/ML IV SOLN
500.0000 [IU] | Freq: Once | INTRAVENOUS | Status: AC
  Administered 2019-05-29: 500 [IU] via INTRAVENOUS
  Filled 2019-05-29: qty 5

## 2019-05-29 MED ORDER — FENTANYL 25 MCG/HR TD PT72: 1.0000 | MEDICATED_PATCH | TRANSDERMAL | 0 refills | Status: AC

## 2019-05-29 MED ORDER — POLYETHYLENE GLYCOL 3350 17 G PO PACK: 17.0000 g | PACK | Freq: Every day | ORAL | 0 refills | Status: AC

## 2019-05-29 MED ORDER — CLONAZEPAM 1 MG PO TABS: 1.0000 mg | ORAL_TABLET | Freq: Two times a day (BID) | ORAL | 0 refills | Status: AC

## 2019-05-29 NOTE — Discharge Summary (Signed)
Physician Discharge Summary  Micheal Vincent BWG:[id number] DOB: 1961/01/23 DOA: 05/27/2019  PCP: Patient, No Pcp Per  Admit date: 05/27/2019 Discharge date: 05/29/2019  Admitted From: Home Disposition:  Home with Hospice  Discharge Condition:Stable CODE STATUS:DNR Diet recommendation:  Regular  Brief/Interim Summary:  Patient is a 59 year old male with history of non-small cell lung cancer with mets to brain who presented to the emergency department with confusion, fatigue, headache.  Patient was noted to have worsening confusion and declining over last few days.  Also reported of nonfluent speech, dysarthria.  MRI of the brain done in the emergency department showed substantial progression of metastatic disease to the brain since 04/22/2019, new abundant leptomeningeal tumor affecting cerebellum, midbrain, splenium.Found to have   at least 12 new or larger discrete brain metastasis since last imaging.  Neuro oncology, Dr. Mickeal Skinner was consulted who recommended palliative care consult/comfort care.  Started on Decadron. After discussion about goals of care, patient has wished to transition his care to comfort.  Plan is to discharge to home with hospice today.  Following problems were addressed during his hospitalization:  Metastatic non-small cell lung cancer to brain: Presented with fatigue, confusion, slurred speech, headache, right facial droop.  MRI as above.  He was following with neuro oncology.  Currently on Decadron.  Neuro oncology, Dr Mickeal Skinner  has been consulted who recommended palliative care consult, comfort care . Patient understands the current situation and wishes to can transition his care to comfort.  Palliative care following.  Plan is to discharge him to home with hospice. He was previously on chemotherapy, radiation therapy for his lung cancer.  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  He was on Fluid restriction.  Abdominal pain: Continue bowel regimen.  Low  suspicion for acute intra-abdominal pathology.  Abdominal x-ray on 05/31/2019 showed large amount of stool.  Abdomen was soft and nontender on examination.    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 2)Follow up with Hospice.   Increase activity slowly   Complete by: As directed      Allergies as of 05/29/2019   No Known Allergies     Medication List    STOP taking these medications   albuterol 108 (90 Base) MCG/ACT inhaler Commonly known as: VENTOLIN HFA   aspirin 177 MG tablet   folic acid 1 MG tablet Commonly known as: FOLVITE   ipratropium-albuterol 0.5-2.5 (3) MG/3ML Soln Commonly known as: DUONEB   lidocaine-prilocaine cream Commonly known as: EMLA   loperamide 2 MG capsule Commonly known as: IMODIUM   morphine 15 MG 12 hr tablet Commonly known as: MS CONTIN   thiamine 100 MG tablet     TAKE these medications   clonazePAM 1 MG tablet Commonly known as: KLONOPIN Take 1 tablet (1 mg total) by mouth 2 (two) times daily. What changed:   when to take this  reasons to take this   dexamethasone 4 MG tablet Commonly known as: DECADRON Take 1 tablet (4 mg total) by mouth 2 (two) times daily. Starting 4.9.21 x7days Then decrease back down to 4mg  QD What changed: Another medication with the same name was removed. Continue taking this medication, and follow the directions you see here.    dicyclomine 10 MG capsule Commonly known as: Bentyl Take 1 capsule (10 mg total) by mouth every 4 (four) hours as needed for spasms (stomach pain).   famotidine 20 MG tablet Commonly known as: PEPCID Take 1 tablet (20 mg total) by mouth 2 (two) times daily.   fentaNYL 25 MCG/HR Commonly known as: [place] 1 patch onto the skin every 3 (three) days. Start taking on: May 31, 2019   fluconazole 100 MG tablet Commonly known as: DIFLUCAN Take 1 tablet (100 mg total) by mouth daily. Take two tablets (200mg ) today then one tablet daily for seven.   mirtazapine 7.5 MG tablet Commonly known as: REMERON Take 1 tablet (7.5 mg total) by mouth at bedtime.   ondansetron 8 MG tablet Commonly known as: ZOFRAN Take 1 tablet (8 mg total) by mouth every 8 (eight) hours as needed for nausea or vomiting.   oxyCODONE 5 MG immediate release tablet Commonly known as: Oxy IR/ROXICODONE Take 1-2 tablets (5-10 mg total) by mouth every 6 (six) hours as needed for severe pain.   polyethylene glycol 17 g packet Commonly known as: MIRALAX / GLYCOLAX Take 17 g by mouth daily.   senna-docusate 8.6-50 MG tablet Commonly known as: Senokot-S Take 1 tablet by mouth 2 (two) times daily.   simethicone 40 MG/0.6ML drops Commonly known as: Mylicon Take 0.6 mLs (40 mg total) by mouth 4 (four) times daily as needed for flatulence.       No Known Allergies  Consultations:  Palliative care, neuro-oncology   Procedures/Studies: CT Head Wo Contrast  Result Date: 05/22/2019 CLINICAL DATA:  Lung carcinoma with known metastases. Headache and altered mental status EXAM: CT HEAD WITHOUT CONTRAST TECHNIQUE: Contiguous axial images were obtained from the base of the skull through the vertex without intravenous contrast. COMPARISON:  Brain MRI April 22, 2019; head CT April 13, 2019 FINDINGS: Brain: Ventricles and sulci are normal in size and configuration. There is extensive vasogenic edema in the right  frontal lobe with persistent although lesser amount of edema in the medial and mid left upper lobe regions. Vasogenic edema extends into the anterior limb of the right internal capsule. There is vasogenic edema in the mid and posterior right temporal lobe regions, stable. There is vasogenic edema in the medial left temporal lobe, status similar to prior study. There is vasogenic edema in the medial left occipital lobe, less apparent than on recent studies. Vasogenic edema is noted in the medial right cerebellum, slightly more pronounced than on recent studies. There is slight effacement of the lateral right fourth ventricle. There is no well-defined mass on noncontrast enhanced CT. No hemorrhage, extra-axial fluid collection, or midline shift evident. No acute infarct evident. Vascular: No hyperdense vessel. No appreciable vascular calcification. Skull: The bony calvarium appears intact. Sinuses/Orbits: Visualized paranasal sinuses are clear. Visualized orbits appear symmetric bilaterally. Other: Mastoid air cells are clear. IMPRESSION: Multiple areas of vasogenic edema consistent with known metastatic disease. The known masses in these areas are well seen by MR but not appreciable by noncontrast enhanced CT. In comparison with most recent studies, there is slightly more vasogenic edema in the medial right cerebellum with  mild effacement of the lateral aspect of the fourth ventricle on the right. No hydrocephalus or midline shift evident. There is slightly less vasogenic edema in the medial left occipital lobe compared to recent studies. Areas of vasogenic edema elsewhere appear essentially stable. No acute infarct appreciable.  No extra-axial fluid. Electronically Signed   By: Lowella Grip III M.D.   On: 05/22/2019 12:15   CT Chest W Contrast  Result Date: 05/12/2019 CLINICAL DATA:  Restaging non-small cell lung cancer. EXAM: CT CHEST, ABDOMEN, AND PELVIS WITH CONTRAST TECHNIQUE: Multidetector CT imaging  of the chest, abdomen and pelvis was performed following the standard protocol during bolus administration of intravenous contrast. CONTRAST:  14mL OMNIPAQUE IOHEXOL 300 MG/ML  SOLN COMPARISON:  PET-CT 01/29/2019 FINDINGS: CT CHEST FINDINGS Cardiovascular: Normal heart size. No pericardial effusion identified. Mediastinum/Nodes: Normal appearance of the thyroid gland. The trachea appears patent and is midline. New subcarinal adenopathy measures 1.7 x 3.3 cm, image 29/2. No supraclavicular or axillary adenopathy. Lungs/Pleura: No pleural effusion. Treated tumor within the posterior and lateral left upper lobe is identified and appears largely cavitary compatible with interval necrosis. On today's exam this measures 7.4 by 4.4 cm, image 42/7. The previous solid FDG avid posterior left upper lobe lung mass measured 6.0 x 4.9 cm. New area of subpleural scarring and reticulation identified within the posterolateral right lower lobe, image 99/7. Likely postinflammatory. Musculoskeletal: No chest wall mass or suspicious bone lesions identified. CT ABDOMEN PELVIS FINDINGS Hepatobiliary: Increased soft tissue attenuation within the gallbladder is identified, with scattered areas of low attenuation measuring 3.1 x 2.4 cm, image 16/4. This is favored to adenomyomatosis. Scattered enhancing foci within the liver are again identified corresponding to previously demonstrated liver hemangiomas. Pancreas: Unremarkable. No pancreatic ductal dilatation or surrounding inflammatory changes. Spleen: Normal in size without focal abnormality. Adrenals/Urinary Tract: There is a new lesion within the right adrenal gland with long axis measuring 3.4 cm compatible with metastatic disease. Bilateral hypodense kidney lesions are also identified (in addition to previously noted kidney cyst.) The largest is in the anterior cortex of the upper pole of left kidney measuring 2.5 cm, image 61/2. Worrisome for metastatic disease. Similarly, there is  a low-density structure within the posteromedial cortex of the right mid kidney measuring 1.1 cm, image 71/2. Also worrisome for metastatic disease. Stomach/Bowel: Stomach is within normal limits. Appendix appears normal. No evidence of bowel wall thickening, distention, or inflammatory changes. Vascular/Lymphatic: Aortic atherosclerosis. No aneurysm. No abdominal adenopathy. There is a new lymph node within the left posterior pelvis measuring 0.9 x 1.7 cm, image 110/2. Reproductive: Status post hysterectomy. No adnexal masses. Other: Trace free fluid noted within the right posterior pelvis. No peritoneal nodule or mass. Musculoskeletal: Lumbar degenerative disc disease. No aggressive bone lesions IMPRESSION: 1. Mixed response to therapy. The previously noted solid left upper lobe lung mass has undergone interval necrosis with residual thin walled cavitary lesion in place. However, there has been interval development of subcarinal adenopathy, bilateral kidney metastasis and right adrenal gland metastasis. 2. Increased soft tissue attenuation within the gallbladder is favored to represent adenomyomatosis. Consider further evaluation with non-emergent gallbladder sonogram. 3. Aortic atherosclerosis. 4. New area of subpleural scarring and reticulation within the posterolateral right lower lobe is favored to represent sequelae of inflammation/infection. Aortic Atherosclerosis (ICD10-I70.0). Electronically Signed   By: Kerby Moors M.D.   On: 05/12/2019 13:19   MR Brain W and Wo Contrast  Result Date: 05/27/2019 CLINICAL DATA:  60 year old male with metastatic non-small cell carcinoma  of the left upper lung. Status post radiation treatment for palliation of brain metastases last month. Worsening altered mental status. EXAM: MRI HEAD WITHOUT AND WITH CONTRAST TECHNIQUE: Multiplanar, multiecho pulse sequences of the brain and surrounding structures were obtained without and with intravenous contrast. CONTRAST:  7.38mL  GADAVIST GADOBUTROL 1 MMOL/ML IV SOLN COMPARISON:  Brain MRI 04/22/2019 and earlier. FINDINGS: BRAIN There is substantial new leptomeningeal enhancement involving the superior cerebellum, quadrigeminal plate, dorsal splenium (series 18, image 12). And the dominant superior cerebellar metastasis as seen on series 16, image 41 has increased from a roughly 18 mm to 27 mm. Moderately increased cerebellar edema is noted on series 12, image 13. There is increased mass effect on the 4th ventricle, although no associated ventriculomegaly or transependymal edema. Basilar cisterns remain patent at this time. Additionally, there are a number of other new or increased cerebral metastases, including: New Lesions: 8 mm nodular, enhancing lesion located along the dorsal midbrain near the cerebral aqueduct with mild surrounding edema and no mass effect seen on series 16, image 54. 9 mm enhancing and partially dural based lesion located posterior right frontal operculum with mild surrounding edema and minimal mass effect seen on series 16 image 87 and series 17, image 15. 3 mm enhancing lesion located at the superior and medial right thalamus with minimal edema and no mass effect seen on series 16, image 78. 10-12 mm enhancing lesion located in the left choroid plexus seen on series 16, image 84. There is associated new edema in the medial left thalamus, and splenium of the corpus callosum (series 12, image 27). 9 mm enhancing lesion located at the right cerebral peduncle with mild to moderate edema and no mass effect seen on series 16, image 59. 9 mm enhancing lesion located at the right cerebral peduncle with mild to moderate edema and no mass effect seen on series 16, image 59. Larger lesions: Increased small enhancing lesion located in the left parietal lobe, increased from punctate (series 11, image 90 previously) to now 7 mm, with mild new edema (series 12, image 28), and no no mass effect. Series 16, image 81. Increased small  enhancing lesion located at the medial anterior left frontal lobe, increased from punctate to now 5 mm, minimal edema, no mass effect. Series 16, image 67. Increased small enhancing lesion located at the dorsal right thalamus, increased from punctate to now 6 mm, minimal edema, no mass effect. Also on image 67. Increased small enhancing lesion located at the anterior right temporal tip, increased from punctate to now 6 mm, mild no mass effect. Series 16, image 40. Treated enhancing lesion located at the anterior right frontal lobe has increased from 15 mm to now 17 mm, but with stable confluent regional T2/FLAIR hyperintensity in a vasogenic edema pattern, no increased mass effect. Series 16, image 93. Treated small enhancing lesion located anterior left frontal lobe has increased from 4 mm to now 8 mm, with mildly increased edema, no increased mass effect. Series 16, image 107. Other Brain findings: Cerebral edema has also moderately increased in the posterior right temporal lobe, mesial left temporal lobe. There is no midline shift. Some of the cerebellar lesions are hemorrhagic as before, but no acute intracranial hemorrhage is identified. There is hypercellularity associated abnormal diffusion. But no restricted diffusion suspicious for acute infarction. Negative pituitary and cervicomedullary junction. Vascular: Major intracranial vascular flow voids are stable. The major dural venous sinuses are enhancing and appear to be patent. Skull and upper cervical spine:  Questionable new punctate abnormal enhancement of the right side cervical spinal cord at C2-C3 on series 18 image 13. No cord edema. Visible bone marrow signal remains within normal limits. Sinuses/Orbits: Stable, negative. Other: Mastoids remain clear. But there is new leptomeningeal enhancement in the bilateral internal auditory canals (series 16, image 30 on the right). IMPRESSION: 1. Substantial progression metastatic disease to the brain since  04/22/2019, most notably new abundant leptomeningeal tumor affecting the cerebellum, the midbrain, and splenium. 2. Additionally, there are at least 12 new or larger discrete brain metastases when compared to 04/22/2019. There is also a questionable new tiny right C2-C3 spinal cord metastasis. 3. Associated increased and moderate edema in the cerebellum, right temporal lobe, left thalamus and splenium. 4. However, there is no ventriculomegaly, midline shift, or loss of basilar cisterns at this time. Electronically Signed   By: Genevie Ann M.D.   On: 05/27/2019 18:13   CT Abdomen Pelvis W Contrast  Result Date: 05/12/2019 CLINICAL DATA:  Restaging non-small cell lung cancer. EXAM: CT CHEST, ABDOMEN, AND PELVIS WITH CONTRAST TECHNIQUE: Multidetector CT imaging of the chest, abdomen and pelvis was performed following the standard protocol during bolus administration of intravenous contrast. CONTRAST:  118mL OMNIPAQUE IOHEXOL 300 MG/ML  SOLN COMPARISON:  PET-CT 01/29/2019 FINDINGS: CT CHEST FINDINGS Cardiovascular: Normal heart size. No pericardial effusion identified. Mediastinum/Nodes: Normal appearance of the thyroid gland. The trachea appears patent and is midline. New subcarinal adenopathy measures 1.7 x 3.3 cm, image 29/2. No supraclavicular or axillary adenopathy. Lungs/Pleura: No pleural effusion. Treated tumor within the posterior and lateral left upper lobe is identified and appears largely cavitary compatible with interval necrosis. On today's exam this measures 7.4 by 4.4 cm, image 42/7. The previous solid FDG avid posterior left upper lobe lung mass measured 6.0 x 4.9 cm. New area of subpleural scarring and reticulation identified within the posterolateral right lower lobe, image 99/7. Likely postinflammatory. Musculoskeletal: No chest wall mass or suspicious bone lesions identified. CT ABDOMEN PELVIS FINDINGS Hepatobiliary: Increased soft tissue attenuation within the gallbladder is identified, with  scattered areas of low attenuation measuring 3.1 x 2.4 cm, image 16/4. This is favored to adenomyomatosis. Scattered enhancing foci within the liver are again identified corresponding to previously demonstrated liver hemangiomas. Pancreas: Unremarkable. No pancreatic ductal dilatation or surrounding inflammatory changes. Spleen: Normal in size without focal abnormality. Adrenals/Urinary Tract: There is a new lesion within the right adrenal gland with long axis measuring 3.4 cm compatible with metastatic disease. Bilateral hypodense kidney lesions are also identified (in addition to previously noted kidney cyst.) The largest is in the anterior cortex of the upper pole of left kidney measuring 2.5 cm, image 61/2. Worrisome for metastatic disease. Similarly, there is a low-density structure within the posteromedial cortex of the right mid kidney measuring 1.1 cm, image 71/2. Also worrisome for metastatic disease. Stomach/Bowel: Stomach is within normal limits. Appendix appears normal. No evidence of bowel wall thickening, distention, or inflammatory changes. Vascular/Lymphatic: Aortic atherosclerosis. No aneurysm. No abdominal adenopathy. There is a new lymph node within the left posterior pelvis measuring 0.9 x 1.7 cm, image 110/2. Reproductive: Status post hysterectomy. No adnexal masses. Other: Trace free fluid noted within the right posterior pelvis. No peritoneal nodule or mass. Musculoskeletal: Lumbar degenerative disc disease. No aggressive bone lesions IMPRESSION: 1. Mixed response to therapy. The previously noted solid left upper lobe lung mass has undergone interval necrosis with residual thin walled cavitary lesion in place. However, there has been interval development of subcarinal adenopathy, bilateral  kidney metastasis and right adrenal gland metastasis. 2. Increased soft tissue attenuation within the gallbladder is favored to represent adenomyomatosis. Consider further evaluation with non-emergent  gallbladder sonogram. 3. Aortic atherosclerosis. 4. New area of subpleural scarring and reticulation within the posterolateral right lower lobe is favored to represent sequelae of inflammation/infection. Aortic Atherosclerosis (ICD10-I70.0). Electronically Signed   By: Kerby Moors M.D.   On: 05/12/2019 13:19   DG Chest Portable 1 View  Result Date: 05/27/2019 CLINICAL DATA:  Lung cancer.  Fatigue and cough. EXAM: PORTABLE CHEST 1 VIEW COMPARISON:  05/24/2019 FINDINGS: 1539 hours. The cardiopericardial silhouette is within normal limits for size. Right lung clear. Cavitary lesion left apex is similar to prior. Right Port-A-Cath unchanged. The visualized bony structures of the thorax are intact. Telemetry leads overlie the chest. IMPRESSION: Stable.  No new or acute interval findings. Electronically Signed   By: Misty Stanley M.D.   On: 05/27/2019 16:00   DG Chest Port 1 View  Result Date: 05/22/2019 CLINICAL DATA:  Headache and fever EXAM: PORTABLE CHEST 1 VIEW COMPARISON:  Chest radiograph April 13, 2019 and chest CT May 12, 2019 FINDINGS: Port-A-Cath tip is in the superior vena cava. No pneumothorax. There is cavitation in the left upper lobe measuring 7.6 x 6.0 cm. There is less opacification and thickening in this area compared to the previous study. A small amount of thickening along the inferior aspect of this cavity noted. Lungs elsewhere clear. Heart size and pulmonary vascular normal. No adenopathy. No bone lesions. IMPRESSION: Cavitary area in the left upper lobe persists with less thickening along the wall of this lesion. A small amount of thickening along the inferior aspect of this lesion persists. No associated consolidation. No new parenchymal lung lesion. Cardiac silhouette within normal limits. Port-A-Cath tip in superior vena cava. Electronically Signed   By: Lowella Grip III M.D.   On: 05/22/2019 11:40   DG Abd 2 Views  Result Date: 05/24/2019 CLINICAL DATA:  Rule out bowel  obstruction EXAM: ABDOMEN - 2 VIEW COMPARISON:  None. FINDINGS: There is a large amount of stool throughout the colon. There is no bowel dilatation to suggest obstruction. There is no evidence of pneumoperitoneum, portal venous gas or pneumatosis. There are no pathologic calcifications along the expected course of the ureters. The osseous structures are unremarkable. IMPRESSION: Large amount of stool throughout the colon. Electronically Signed   By: Kathreen Devoid   On: 05/24/2019 12:45       Subjective: Patient seen and examined at the bedside this morning.  Hemodynamically stable for discharge to home with hospice today.  Discharge Exam: Vitals:   05/28/19 0943 05/28/19 1335  BP:  (!) 150/93  Pulse:  85  Resp:  15  Temp:  98.4 F (36.9 C)  SpO2: 93% (!) 89%   Vitals:   05/28/19 0615 05/28/19 0930 05/28/19 0943 05/28/19 1335  BP: (!) 136/95 140/86  (!) 150/93  Pulse: 82 99  85  Resp: 16 16  15   Temp: 98.5 F (36.9 C) 98.4 F (36.9 C)  98.4 F (36.9 C)  TempSrc: Oral Oral  Oral  SpO2: 96% (!) 89% 93% (!) 89%  Weight:      Height:        General: Pt is alert, awake, not in acute distress Cardiovascular: RRR, S1/S2 +, no rubs, no gallops Respiratory: CTA bilaterally, no wheezing, no rhonchi Abdominal: Soft, NT, ND, bowel sounds + Extremities: no edema, no cyanosis    The results of significant  diagnostics from this hospitalization (including imaging, microbiology, ancillary and laboratory) are listed below for reference.     Microbiology: Recent Results (from the past 240 hour(s))  Urine Culture     Status: None   Collection Time: 05/26/19  2:40 PM   Specimen: Urine, Clean Catch  Result Value Ref Range Status   Specimen Description   Final    URINE, CLEAN CATCH Performed at [facility], [address] [place], [place] [address]    Special Requests   Final    NONE Performed at [facility], [place] [address],  [place], [place] [address]    Culture   Final    NO GROWTH Performed at [facility], [place] [address], [place], [place] [address]    Report Status 05/27/2019 FINAL  Final  Urine culture     Status: None   Collection Time: 05/27/19  4:11 PM   Specimen: Urine, Random  Result Value Ref Range Status   Specimen Description   Final    URINE, RANDOM Performed at [place] [address] [place], [place] [address]    Special Requests   Final    NONE Performed at [facility], [place] [address], [place], [place] [address]    Culture   Final    NO GROWTH Performed at [facility], [place] [address] [place], [place] [address]    Report Status 05/29/2019 FINAL  Final  Blood culture (routine x 2)     Status: None (Preliminary result)   Collection Time: 05/27/19  4:11 PM   Specimen: BLOOD RIGHT FOREARM  Result Value Ref Range Status   Specimen Description   Final    BLOOD RIGHT FOREARM Performed at [facility] [address], [place], [place] [address]    Special Requests   Final    BOTTLES DRAWN AEROBIC AND ANAEROBIC Blood Culture adequate volume Performed at [facility] [address] [place], [place] [address]    Culture   Final    NO GROWTH < 24 HOURS Performed at [facility] [address] [place], [place] [address]    Report Status PENDING  Incomplete  Blood culture (routine x 2)     Status: None (Preliminary result)   Collection Time: 05/27/19  4:11 PM   Specimen: BLOOD LEFT FOREARM  Result Value Ref Range Status   Specimen Description   Final    BLOOD LEFT FOREARM Performed at [facility] [address], [place], [place] [address]    Special Requests   Final    BOTTLES DRAWN AEROBIC AND ANAEROBIC Blood Culture results may not be optimal due to an inadequate volume of blood received in culture bottles Performed at [facility]  [address], [place], [place] [address]    Culture   Final    NO GROWTH < 24 HOURS Performed at [facility] [address] [place],  [address]    Report Status PENDING  Incomplete  SARS CORONAVIRUS 2 (TAT 6-24 HRS) Nasopharyngeal Nasopharyngeal Swab     Status: None   Collection Time: 05/27/19  7:17 PM   Specimen: Nasopharyngeal Swab  Result Value Ref Range Status   SARS Coronavirus 2 NEGATIVE NEGATIVE Final    Comment: (NOTE) SARS-CoV-2 target nucleic acids are NOT DETECTED. The SARS-CoV-2 RNA is generally detectable in upper and lower respiratory specimens during the acute phase  of infection. Negative results do not preclude SARS-CoV-2 infection, do not rule out co-infections with other pathogens, and should not be used as the sole basis for treatment or other patient management decisions. Negative results must be combined with clinical observations, patient history, and epidemiological information. The expected result is Negative. Fact Sheet for Patients: SugarRoll.be Fact Sheet for Healthcare Providers: https://www.woods-mathews.com/ This test is not yet approved or cleared by the [place] FDA and  has been authorized for detection and/or diagnosis of SARS-CoV-2 by FDA under an Emergency Use Authorization (EUA). This EUA will remain  in effect (meaning this test can be used) for the duration of the COVID-19 declaration under Section 56 4(b)(1) of the Act, 21 U.S.C. section 360bbb-3(b)(1), unless the authorization is terminated or revoked sooner. Performed at [facility], [place] [address] [place], [place] [address]      Labs: BNP (last 3 results) No results for input(s): BNP in the last 8760 hours. Basic Metabolic Panel: Recent Labs  Lab 05/22/19 1148 05/24/19 1054 05/27/19 1611 05/28/19 0500  NA 128* 136 129* 133*  K 3.8 4.5 4.2 4.2  CL 91* 98 91* 93*  CO2 26 25 25 27   GLUCOSE 109* 123* 101* 113*   BUN 12 13 12 12   CREATININE 0.65 0.76 0.60* 0.57*  CALCIUM 8.8* 9.6 9.2 9.0  MG  --  1.9  --   --    Liver Function Tests: Recent Labs  Lab 05/22/19 1148 05/24/19 1054 05/27/19 1611 05/28/19 0500  AST 17 14* 18 17  ALT 38 38 45* 41  ALKPHOS 71 82 79 69  BILITOT 0.8 0.4 0.9 0.7  PROT 7.0 7.3 7.4 6.8  ALBUMIN 3.2* 2.7* 3.1* 2.9*   Recent Labs  Lab 05/27/19 1611  LIPASE 16   Recent Labs  Lab 05/27/19 1611  AMMONIA 34   CBC: Recent Labs  Lab 05/22/19 1148 05/24/19 1054 05/26/19 1430 05/27/19 1611 05/28/19 0500  WBC 7.4 10.6* 10.3 12.3* 15.1*  NEUTROABS 6.3 9.3* 8.9* 10.5*  --   HGB 12.0* 12.5* 12.3* 12.5* 11.9*  HCT 37.3* 37.9* 38.0* 38.7* 36.1*  MCV 93.3 92.9 93.1 92.4 93.5  PLT 142* 162 145* 160 161   Cardiac Enzymes: No results for input(s): CKTOTAL, CKMB, CKMBINDEX, TROPONINI in the last 168 hours. BNP: Invalid input(s): POCBNP CBG: No results for input(s): GLUCAP in the last 168 hours. D-Dimer No results for input(s): DDIMER in the last 72 hours. Hgb A1c No results for input(s): HGBA1C in the last 72 hours. Lipid Profile No results for input(s): CHOL, HDL, LDLCALC, TRIG, CHOLHDL, LDLDIRECT in the last 72 hours. Thyroid function studies Recent Labs    05/27/19 1611  TSH 0.617   Anemia work up No results for input(s): VITAMINB12, FOLATE, FERRITIN, TIBC, IRON, RETICCTPCT in the last 72 hours. Urinalysis    Component Value Date/Time   COLORURINE YELLOW 05/27/2019 1611   APPEARANCEUR CLEAR 05/27/2019 1611   LABSPEC 1.011 05/27/2019 1611   PHURINE 8.0 05/27/2019 1611   GLUCOSEU NEGATIVE 05/27/2019 1611   HGBUR SMALL (A) 05/27/2019 1611   BILIRUBINUR NEGATIVE 05/27/2019 1611   KETONESUR NEGATIVE 05/27/2019 1611   PROTEINUR NEGATIVE 05/27/2019 1611   UROBILINOGEN 1.0 05/10/2013 1651   NITRITE NEGATIVE 05/27/2019 1611   LEUKOCYTESUR NEGATIVE 05/27/2019 1611   Sepsis Labs Invalid input(s): PROCALCITONIN,  WBC,   LACTICIDVEN Microbiology Recent Results (from the past 240 hour(s))  Urine Culture     Status: None   Collection Time: 05/26/19  2:40 PM  Specimen: Urine, Clean Catch  Result Value Ref Range Status   Specimen Description   Final    URINE, CLEAN CATCH Performed at [facility], [place] [address] [place], [place] [address]    Special Requests   Final    NONE Performed at [facility], [place] [address], [place], [place] [address]    Culture   Final    NO GROWTH Performed at [facility], [place] [address] [place], [place] [address]    Report Status 05/27/2019 FINAL  Final  Urine culture     Status: None   Collection Time: 05/27/19  4:11 PM   Specimen: Urine, Random  Result Value Ref Range Status   Specimen Description   Final    URINE, RANDOM Performed at [facility] [address] [place], [place] [address]    Special Requests   Final    NONE Performed at [facility], [place] [address], [place], [place] [address]    Culture   Final    NO GROWTH Performed at [facility], [place] [address] [place], [place] [address]    Report Status 05/29/2019 FINAL  Final  Blood culture (routine x 2)     Status: None (Preliminary result)   Collection Time: 05/27/19  4:11 PM   Specimen: BLOOD RIGHT FOREARM  Result Value Ref Range Status   Specimen Description   Final    BLOOD RIGHT FOREARM Performed at [facility] [address], [place], [place] [address]    Special Requests   Final    BOTTLES DRAWN AEROBIC AND ANAEROBIC Blood Culture adequate volume Performed at [facility] [address] [place], [place] [address]    Culture   Final    NO GROWTH < 24 HOURS Performed at [facility] [address], [place], [place] [address]    Report Status PENDING  Incomplete  Blood culture (routine x 2)     Status: None (Preliminary result)    Collection Time: 05/27/19  4:11 PM   Specimen: BLOOD LEFT FOREARM  Result Value Ref Range Status   Specimen Description   Final    BLOOD LEFT FOREARM Performed at [facility] [address] [place], [place] [address]    Special Requests   Final    BOTTLES DRAWN AEROBIC AND ANAEROBIC Blood Culture results may not be optimal due to an inadequate volume of blood received in culture bottles Performed at [facility] [address], [place], [place] [address]    Culture   Final    NO GROWTH < 24 HOURS Performed at [facility] [address], [place],  [address]    Report Status PENDING  Incomplete  SARS CORONAVIRUS 2 (TAT 6-24 HRS) Nasopharyngeal Nasopharyngeal Swab     Status: None   Collection Time: 05/27/19  7:17 PM   Specimen: Nasopharyngeal Swab  Result Value Ref Range Status   SARS Coronavirus 2 NEGATIVE NEGATIVE Final    Comment: (NOTE) SARS-CoV-2 target nucleic acids are NOT DETECTED. The SARS-CoV-2 RNA is generally detectable in upper and lower respiratory specimens during the acute phase of infection. Negative results do not preclude SARS-CoV-2 infection, do not rule out co-infections with other pathogens, and should not be used as the sole basis for treatment or other patient management decisions. Negative results must be combined with clinical observations, patient history, and epidemiological information. The  expected result is Negative. Fact Sheet for Patients: SugarRoll.be Fact Sheet for Healthcare Providers: https://www.woods-mathews.com/ This test is not yet approved or cleared by the [place] FDA and  has been authorized for detection and/or diagnosis of SARS-CoV-2 by FDA under an Emergency Use Authorization (EUA). This EUA will remain  in effect (meaning this test can be used) for the duration of the COVID-19 declaration under Section 56 4(b)(1) of the Act, 21  U.S.C. section 360bbb-3(b)(1), unless the authorization is terminated or revoked sooner. Performed at [facility], [place] [address], [place], [place] [address]     Please note: You were cared for by a hospitalist during your hospital stay. Once you are discharged, your primary care physician will handle any further medical issues. Please note that NO REFILLS for any discharge medications will be authorized once you are discharged, as it is imperative that you return to your primary care physician (or establish a relationship with a primary care physician if you do not have one) for your post hospital discharge needs so that they can reassess your need for medications and monitor your lab values.    Time coordinating discharge: 40 minutes  SIGNED:   Shelly Coss, MD  [facility] 05/29/2019, 9:46 AM Pager [phone]  If 7PM-7AM, please contact night-coverage [email or web address] Password [email or web address]

## 2019-05-29 NOTE — Progress Notes (Signed)
[facility]:  [employer]  TC to family and equipment was delivered last evening but family wasn't ready for it so she arranged for it to be delivered this am. It is suppose to be there by 1200N.   Will continue to follow and update as we know more. Pt family ready to accept once equipment in place. Webb Silversmith RN [phone]

## 2019-05-29 NOTE — TOC Transition Note (Signed)
Transition of Care [facility]) - CM/SW Discharge Note   Patient Details  Name: Micheal [place] MRN: [id number] Date of Birth: 14-Nov-1960  Transition of [facility]) CM/SW Contact:  Lennart Pall, LCSW Phone Number: 05/29/2019, 10:49 AM   Clinical Narrative:   Pt cleared for dc. Ambulance called.  Sister aware.    Final next level of care: Home w Hospice Care Barriers to Discharge: Barriers Resolved   Patient Goals and CMS Choice        Discharge Placement                  Name of family member notified: sister, [name] Patient and family notified of of transfer: 05/29/19  Discharge Plan and Services   Discharge Planning Services: CM Consult Post Acute Care Choice: Hospice                    HH Arranged: Disease Management [place] Agency: [place] Date [place]: 05/28/19 Time [place]: 1359 Representative spoke with at [place]: Cheri  Social Determinants of Health ([place]) Interventions     Readmission Risk Interventions Readmission Risk Prevention Plan 05/28/2019  Transportation Screening Complete  Medication Review [employer]) Complete  PCP or Specialist appointment within 3-5 days of discharge Complete  HRI or [place] Not Applicable  Some recent data might be hidden

## 2019-05-31 ENCOUNTER — Other Ambulatory Visit: Payer: Self-pay | Admitting: Oncology

## 2019-05-31 ENCOUNTER — Telehealth: Payer: Self-pay | Admitting: *Deleted

## 2019-05-31 MED FILL — DEXAMETHASONE 4 MG TABLET: 4 | 15 days supply | Qty: 30 | Fill #0

## 2019-05-31 MED FILL — STOOL SOFTENER/LAXATIVE 50-: 50-8.6 | 50 days supply | Qty: 100 | Fill #0

## 2019-05-31 MED FILL — SM CLEARLAX POWDER: 17 | 14 days supply | Qty: 238 | Fill #0

## 2019-05-31 NOTE — Telephone Encounter (Signed)
Received call back from Gaylord's sister, Jeannene Patella.  Expressed our sadness that his disease has spread so widely.  She states that [place] is involved now-they started on 05/28/19.  She states he is bed bound now and incoherent much of the time and gets agitated.  Hospice is working on medications that will help to be more comfortable.  He often has opposite reactions to meds for anxiety. As Ashwin is bed bound and with Hospice, [name] would like all his future appts cancelled. Encouraged Pam to call us with any questions or concerns.  Emotional support extended.  Pam expressed her appreciation for our care of [place].

## 2019-05-31 NOTE — Telephone Encounter (Signed)
Attempted call to pt's sister, [name]. No answer and unable to leave vm message as her vm mailbox is full.  Called pt's mother and spoke with her. Informed her that I needed to speak with [name]. She stated she would try to get in touch with [name] as well and have her call this Probation officer at Dr. Libby Maw office.

## 2019-06-01 LAB — CULTURE, BLOOD (ROUTINE X 2)
Culture: NO GROWTH
Culture: NO GROWTH
Special Requests: ADEQUATE

## 2019-06-02 ENCOUNTER — Telehealth: Payer: Self-pay | Admitting: Urology

## 2019-06-04 ENCOUNTER — Telehealth: Payer: Self-pay | Admitting: *Deleted

## 2019-06-04 NOTE — Telephone Encounter (Signed)
Received vm message from pt's sister, Micheal [place]. Called her this morning. She called to let us know that Micheal [place] died yesterday in her home. She states that Dr. Nyoka Cowden with [facility] was very helpful in getting him more comfortable. Micheal [place] told me that he was comfortable finally for the last few days.  Micheal [place] expressed her gratitude for all the care that we were able to provide to Micheal [place] in the last few months. This Probation officer expressed to [employer] sorrow at Micheal [place] passing. Emotional support provided.  Dr. Lorenso Courier made aware of pt death.

## 2019-06-09 ENCOUNTER — Other Ambulatory Visit: Payer: Medicaid Other

## 2019-06-09 ENCOUNTER — Ambulatory Visit: Payer: Medicaid Other

## 2019-06-09 ENCOUNTER — Ambulatory Visit: Payer: Medicaid Other | Admitting: Hematology and Oncology

## 2019-06-09 ENCOUNTER — Ambulatory Visit (HOSPITAL_COMMUNITY): Payer: Medicaid Other

## 2019-06-11 ENCOUNTER — Ambulatory Visit: Payer: Medicaid Other | Admitting: Internal Medicine

## 2019-06-11 ENCOUNTER — Ambulatory Visit: Payer: Medicaid Other

## 2019-06-11 ENCOUNTER — Ambulatory Visit: Payer: Medicaid Other | Admitting: Hematology and Oncology

## 2019-06-11 ENCOUNTER — Other Ambulatory Visit: Payer: Medicaid Other

## 2019-06-11 ENCOUNTER — Encounter: Payer: Medicaid Other | Admitting: Nutrition

## 2019-06-19 DEATH — deceased

## 2019-06-21 ENCOUNTER — Ambulatory Visit: Payer: Medicaid Other | Admitting: Internal Medicine

## 2019-07-02 ENCOUNTER — Ambulatory Visit: Payer: Medicaid Other

## 2019-07-02 ENCOUNTER — Ambulatory Visit: Payer: Medicaid Other | Admitting: Hematology and Oncology

## 2019-07-02 ENCOUNTER — Other Ambulatory Visit: Payer: Medicaid Other

## 2019-08-31 ENCOUNTER — Telehealth: Payer: Self-pay | Admitting: *Deleted

## 2019-08-31 NOTE — Telephone Encounter (Signed)
Received call from pt's sister wanting to say Thank you for the sympathy card regarding her brother.  Message routed to Dr Dorsey/Pod RN.

## 2021-08-08 IMAGING — CT CT CHEST W/ CM
2 of 5 series · 12 of 36 positions shown, 15 images · IV contrast (omnipaque)
Comparison: 11/11/2018 CT chest, 11/12/2018 CT abdomen and pelvis
and 11/13/2018 MRI of the abdomen.

CLINICAL DATA: Known left upper lobe lung mass

EXAM:
CT CHEST, ABDOMEN, AND PELVIS WITH CONTRAST
TECHNIQUE: Multidetector CT imaging of the chest, abdomen and pelvis was
performed following the standard protocol during bolus
administration of intravenous contrast.
CONTRAST:  100mL OMNIPAQUE IOHEXOL 300 MG/ML  SOLN

[Series 3: cap with 5mm st · axial · 0.89mm/px · z∈[+901,+1441]mm · 9 of 136 slices shown, 12 images]
[im 14/136  mediastinal]
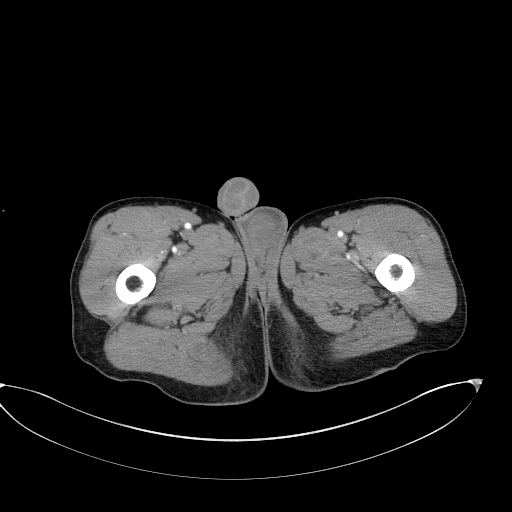
[im 14/136  lung]
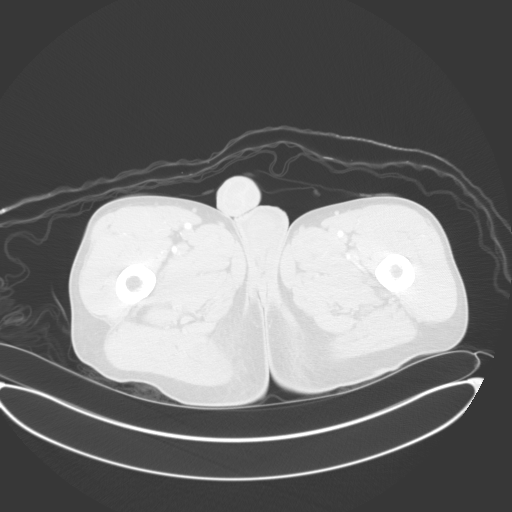
[im 28/136  lung]
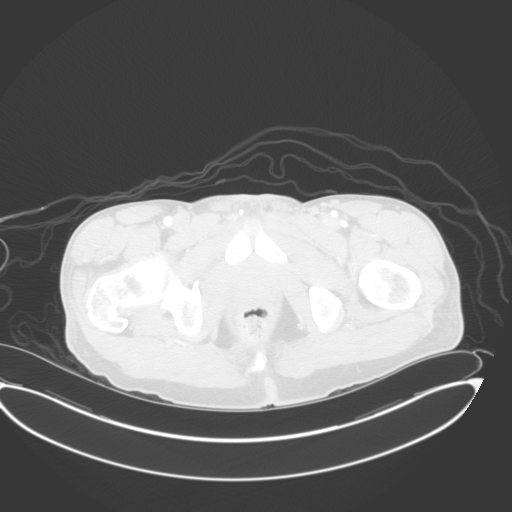
[im 41/136  lung]
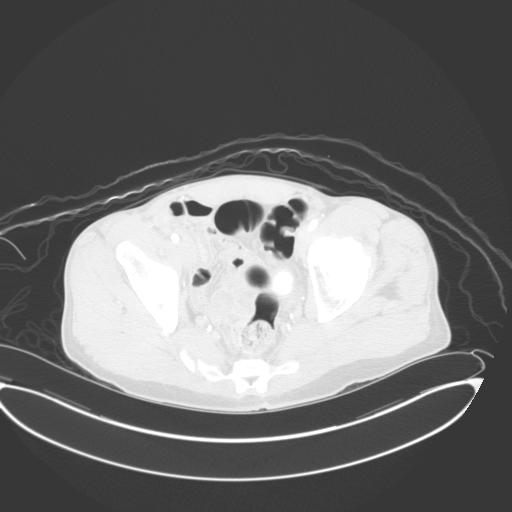
[im 55/136  lung]
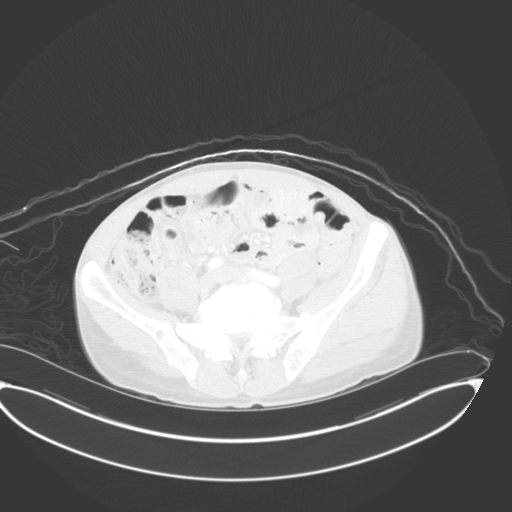
[im 68/136  mediastinal]
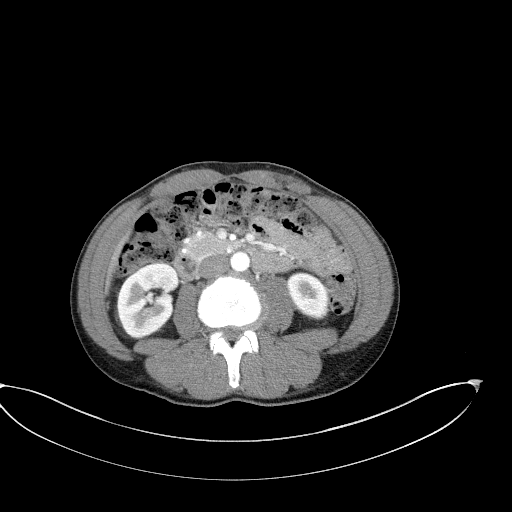
[im 68/136  lung]
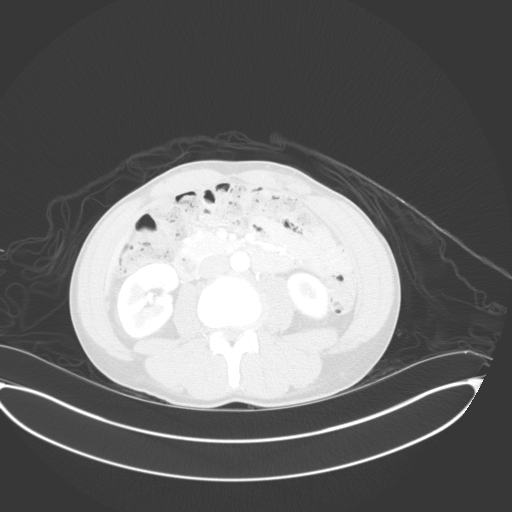
[im 82/136  lung]
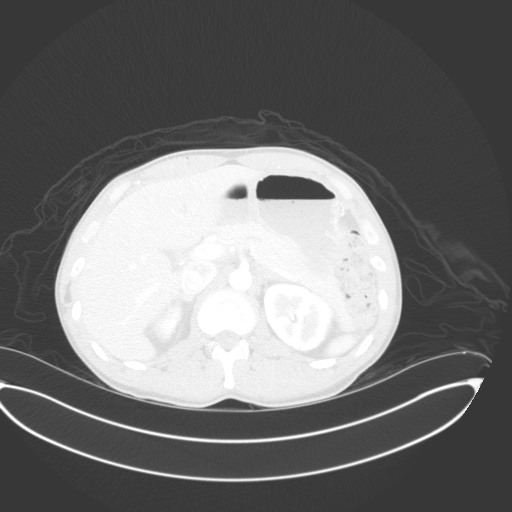
[im 95/136  lung]
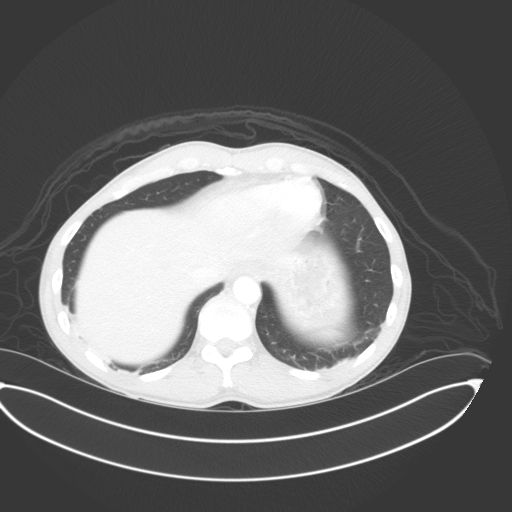
[im 109/136  lung]
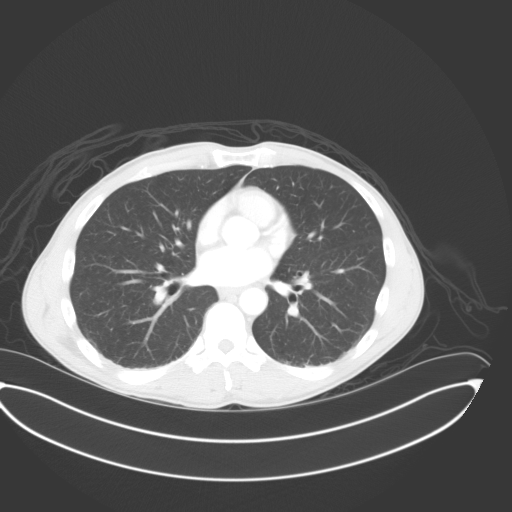
[im 122/136  mediastinal]
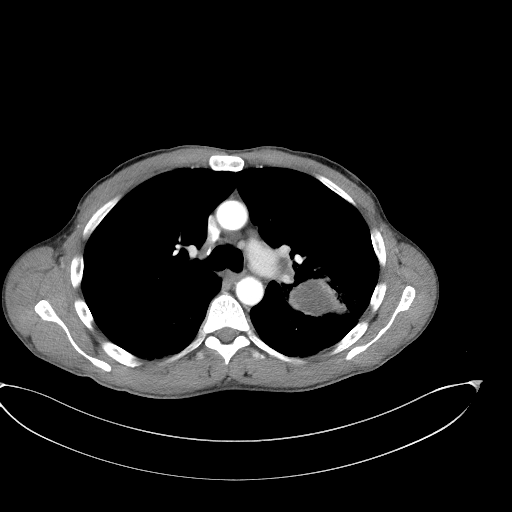
[im 122/136  lung]
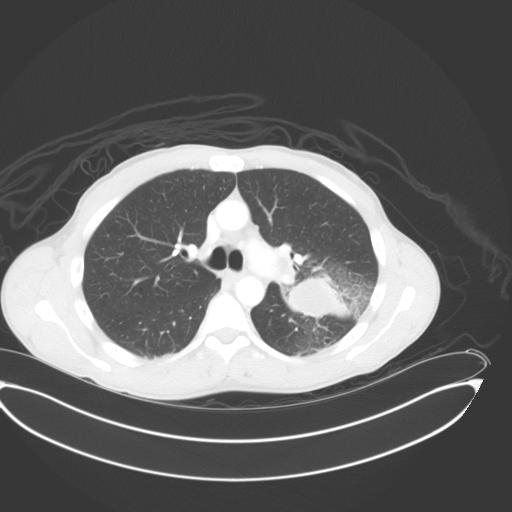

[Series 5: cap with 3mm st cor · coronal · 0.92mm/px · 3 of 146 slices shown]
[im 30/146  lung]
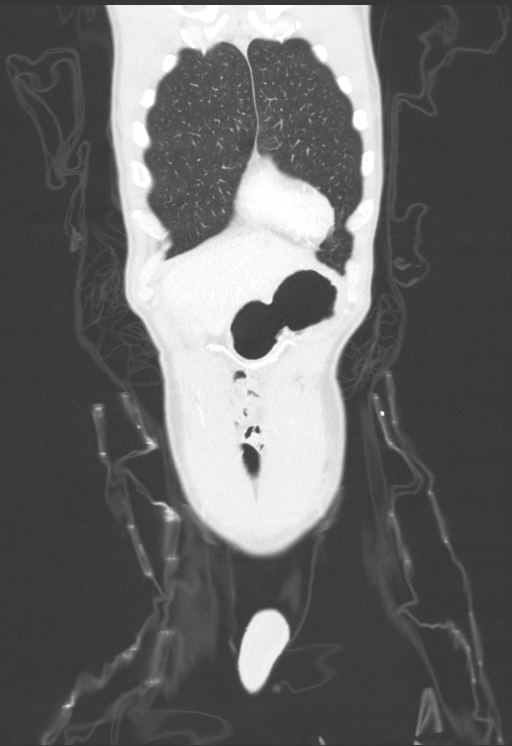
[im 59/146  lung]
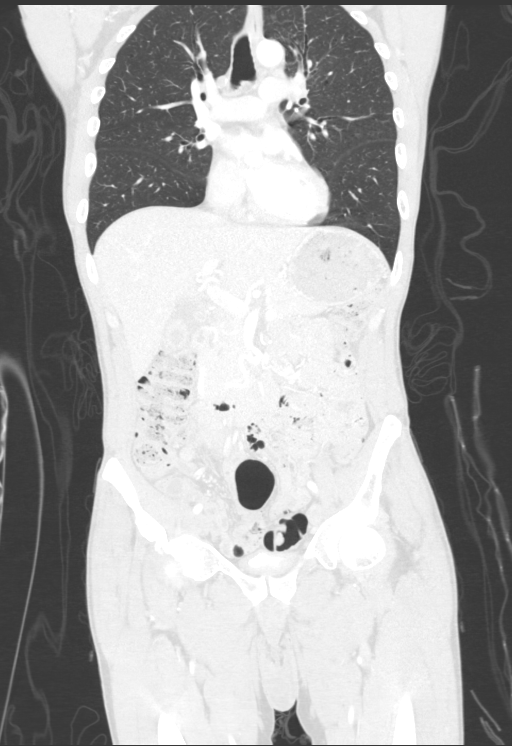
[im 88/146  lung]
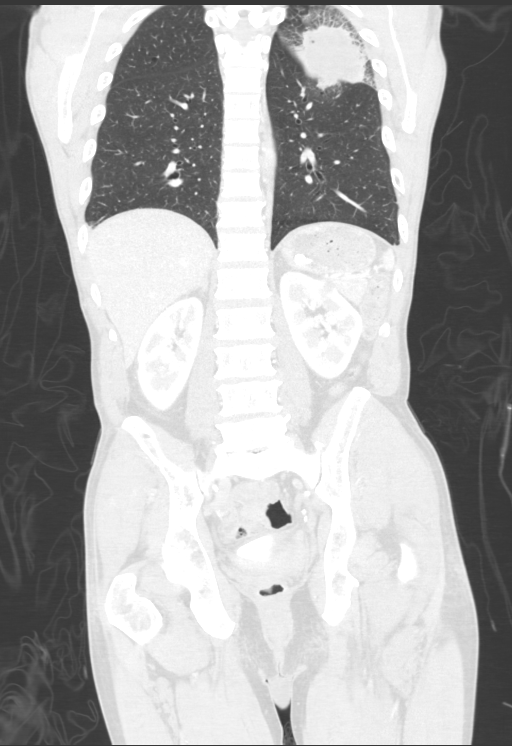

[12 of 36 positions shown; findings below may reference images not displayed]

FINDINGS: CT CHEST FINDINGS

Cardiovascular: Thoracic aorta and its branches are within normal
limits. No cardiac enlargement is seen. No pulmonary emboli are seen
although opacification is poor of the pulmonary artery. No coronary
calcifications are seen. No pericardial effusion is noted.

Mediastinum/Nodes: Thoracic inlet is not well visualized due to a
technical abnormality within the imaging plane. No mediastinal
adenopathy is noted. There is a centrally necrotic lymph node
identified in the left hilum measuring 13.6 mm in short axis. The
esophagus as visualized is unremarkable.

Lungs/Pleura: The right lung demonstrates no focal abnormality. The
left lung again demonstrates the previously seen mass lesion with
displacement of the bronchial tree. This lies along the major
fissure and now measures approximately 5.9 x 3.9 cm increased from
the prior exam at which time it measured 4.1 x 2.9 cm. Additionally
deforms the major fissure more than that seen on the prior exam
consistent with the focal mass effect. Additionally there is
extension of the lesion through the major fissure into the superior
segment of the left lower lobe best seen on image number 26 of
series 4. Some increasing interstitial thickening is noted adjacent
to the mass lesion likely representing some lymphangitic spread of
tumor. Additionally significant central necrosis is now seen.

Musculoskeletal: No rib lesions are noted although the superior
aspect of the chest again is not visualized on this exam. Mild
degenerative changes of the thoracic spine are seen. No acute bony
lesion is noted.

CT ABDOMEN PELVIS FINDINGS

Hepatobiliary: The liver again demonstrates scattered hypodensities
some which demonstrate peripheral enhancement consistent with
hemangiomas. These are stable from the prior CT examination.
Gallbladder is incompletely distended. Some pericholecystic fluid
attenuation is noted stable from the prior exam. This was well
visualized on prior CT and MR and felt to be related to
adenomyomatosis.

Pancreas: Unremarkable. No pancreatic ductal dilatation or
surrounding inflammatory changes.

Spleen: Normal in size without focal abnormality.

Adrenals/Urinary Tract: Adrenal glands are within normal limits. The
kidneys again demonstrate a normal enhancement pattern. Scattered
small cysts are seen bilaterally stable from the prior CT. No
ureteral calculi are seen. The bladder is well distended with
contrast enhanced urine.

Stomach/Bowel: The colon is well visualized without obstructive or
inflammatory changes. The appendix is well visualized. No small
bowel or gastric abnormality is seen.

Vascular/Lymphatic: No significant lymphadenopathy is noted.
Scattered atherosclerotic changes of the aorta and iliac vessels are
seen.

Reproductive: Prostate is unremarkable.

Other: No abdominal wall hernia or abnormality. No abdominopelvic
ascites.

Musculoskeletal: No acute or significant osseous findings.
IMPRESSION: Enlarging left upper lobe mass lesion with significant increased
central necrosis consistent with enlarging pulmonary neoplasm. There
is now extension of the lesion across the major fissure into the
superior segment of the left as well as changes consistent with
lymphangitic spread of carcinoma. Central left hilar adenopathy with
necrosis is seen new from the prior exam. The dominant node measures
13.6 mm within the left hilum and is new from the prior exam. These
changes correspond with that seen on recent MRI and CT suspicious
for metastatic disease.

No intra-abdominal metastatic disease is noted. Chronic changes are
noted within the kidneys and liver as well as the gallbladder
similar to that seen on prior CT and MRI examination.

## 2023-01-08 NOTE — Telephone Encounter (Signed)
Telephone call
